# Patient Record
Sex: Male | Born: 1955 | Race: White | Hispanic: No | Smoking: Never smoker
Health system: Southern US, Community
[De-identification: ages and names within clinical notes are randomized; demographics above are authoritative.]

## PROBLEM LIST (undated history)

## (undated) DIAGNOSIS — K4090 Unilateral inguinal hernia, without obstruction or gangrene, not specified as recurrent: Secondary | ICD-10-CM

## (undated) DIAGNOSIS — M1990 Unspecified osteoarthritis, unspecified site: Secondary | ICD-10-CM

## (undated) DIAGNOSIS — T8859XA Other complications of anesthesia, initial encounter: Secondary | ICD-10-CM

## (undated) DIAGNOSIS — K449 Diaphragmatic hernia without obstruction or gangrene: Secondary | ICD-10-CM

## (undated) DIAGNOSIS — E785 Hyperlipidemia, unspecified: Secondary | ICD-10-CM

## (undated) DIAGNOSIS — G4733 Obstructive sleep apnea (adult) (pediatric): Secondary | ICD-10-CM

## (undated) DIAGNOSIS — N400 Enlarged prostate without lower urinary tract symptoms: Secondary | ICD-10-CM

## (undated) HISTORY — PX: SHOULDER SURGERY: SHX246

## (undated) HISTORY — PX: HIP SURGERY: SHX245

## (undated) HISTORY — DX: Unspecified osteoarthritis, unspecified site: M19.90

## (undated) HISTORY — PX: ABDOMINAL HERNIA REPAIR: SHX539

## (undated) HISTORY — DX: Obstructive sleep apnea (adult) (pediatric): G47.33

## (undated) HISTORY — PX: INNER EAR SURGERY: SHX679

## (undated) HISTORY — PX: OTHER SURGICAL HISTORY: SHX169

## (undated) HISTORY — PX: TYMPANOSTOMY: SHX2586

## (undated) HISTORY — DX: Hyperlipidemia, unspecified: E78.5

## (undated) HISTORY — PX: KNEE SURGERY: SHX244

## (undated) HISTORY — PX: TOTAL HIP ARTHROPLASTY: SHX124

## (undated) HISTORY — PX: INGUINAL HERNIA REPAIR: SUR1180

## (undated) HISTORY — DX: Unilateral inguinal hernia, without obstruction or gangrene, not specified as recurrent: K40.90

## (undated) HISTORY — DX: Diaphragmatic hernia without obstruction or gangrene: K44.9

---

## 1995-08-28 HISTORY — PX: KNEE SURGERY: SHX244

## 2012-03-07 ENCOUNTER — Other Ambulatory Visit (HOSPITAL_COMMUNITY): Payer: Self-pay | Admitting: Orthopedic Surgery

## 2012-03-07 DIAGNOSIS — R609 Edema, unspecified: Secondary | ICD-10-CM

## 2012-03-07 DIAGNOSIS — R52 Pain, unspecified: Secondary | ICD-10-CM

## 2012-03-11 ENCOUNTER — Ambulatory Visit (HOSPITAL_COMMUNITY)
Admission: RE | Admit: 2012-03-11 | Discharge: 2012-03-11 | Disposition: A | Discharge status: Home / Self Care | Payer: Managed Care, Other (non HMO) | Source: Ambulatory Visit | Attending: Orthopedic Surgery | Admitting: Orthopedic Surgery

## 2012-03-11 ENCOUNTER — Other Ambulatory Visit (HOSPITAL_COMMUNITY): Payer: Self-pay

## 2012-03-11 DIAGNOSIS — R609 Edema, unspecified: Secondary | ICD-10-CM

## 2012-03-11 DIAGNOSIS — Z96649 Presence of unspecified artificial hip joint: Secondary | ICD-10-CM | POA: Insufficient documentation

## 2012-03-11 DIAGNOSIS — M1610 Unilateral primary osteoarthritis, unspecified hip: Secondary | ICD-10-CM | POA: Insufficient documentation

## 2012-03-11 DIAGNOSIS — M76899 Other specified enthesopathies of unspecified lower limb, excluding foot: Secondary | ICD-10-CM | POA: Insufficient documentation

## 2012-03-11 DIAGNOSIS — R52 Pain, unspecified: Secondary | ICD-10-CM

## 2012-03-11 DIAGNOSIS — M169 Osteoarthritis of hip, unspecified: Secondary | ICD-10-CM | POA: Insufficient documentation

## 2012-03-12 ENCOUNTER — Ambulatory Visit (HOSPITAL_COMMUNITY): Payer: Managed Care, Other (non HMO)

## 2012-04-07 ENCOUNTER — Other Ambulatory Visit: Payer: Self-pay | Admitting: Orthopaedic Surgery

## 2012-04-07 DIAGNOSIS — R52 Pain, unspecified: Secondary | ICD-10-CM

## 2012-04-07 DIAGNOSIS — R200 Anesthesia of skin: Secondary | ICD-10-CM

## 2012-04-08 ENCOUNTER — Ambulatory Visit
Admission: RE | Admit: 2012-04-08 | Discharge: 2012-04-08 | Disposition: A | Discharge status: Home / Self Care | Payer: Managed Care, Other (non HMO) | Source: Ambulatory Visit | Attending: Orthopaedic Surgery | Admitting: Orthopaedic Surgery

## 2012-04-08 DIAGNOSIS — R200 Anesthesia of skin: Secondary | ICD-10-CM

## 2012-04-08 DIAGNOSIS — R52 Pain, unspecified: Secondary | ICD-10-CM

## 2013-06-29 HISTORY — PX: TOTAL HIP ARTHROPLASTY: SHX124

## 2013-08-27 DIAGNOSIS — K449 Diaphragmatic hernia without obstruction or gangrene: Secondary | ICD-10-CM

## 2013-08-27 HISTORY — DX: Diaphragmatic hernia without obstruction or gangrene: K44.9

## 2013-08-27 HISTORY — PX: ABDOMINAL HERNIA REPAIR: SHX539

## 2014-03-02 ENCOUNTER — Encounter (INDEPENDENT_AMBULATORY_CARE_PROVIDER_SITE_OTHER): Payer: Self-pay | Admitting: Surgery

## 2014-03-18 ENCOUNTER — Ambulatory Visit (INDEPENDENT_AMBULATORY_CARE_PROVIDER_SITE_OTHER): Payer: Managed Care, Other (non HMO) | Admitting: Surgery

## 2014-03-18 ENCOUNTER — Encounter (INDEPENDENT_AMBULATORY_CARE_PROVIDER_SITE_OTHER): Payer: Self-pay | Admitting: Surgery

## 2014-03-18 VITALS — BP 102/68 | HR 56 | Temp 97.9°F | Resp 12 | Ht 71.0 in | Wt 197.4 lb

## 2014-03-18 DIAGNOSIS — K439 Ventral hernia without obstruction or gangrene: Secondary | ICD-10-CM | POA: Insufficient documentation

## 2014-03-18 NOTE — Progress Notes (Signed)
Patient ID: Douglas Hendricks, male   DOB: 08-06-1956, 58 y.o.   MRN: [id number]  Chief Complaint  Patient presents with  . Hernia    new pt- eval ventral hernia    HPI Douglas Hendricks is a 58 y.o. male.  Referred by [name] for evaluation of ventral hernia  HPI This is a healthy 6 -year-old male who presents with a couple of years of a visible enlarging mass in his upper abdomen. Occasionally this becomes uncomfortable. The patient is reasonably active. He denies any obstructive symptoms. The patient is scheduled for hip replacement at [facility] in the next couple of weeks. He presents now for evaluation of a possible ventral hernia.  Past Medical History  Diagnosis Date  . Hyperlipidemia   . Arthritis     Past Surgical History  Procedure Laterality Date  . Hip pain Bilateral   . Tympanostomy Right   . Knee surgery Left   . Hip surgery Right   . Total hip arthroplasty      right    Family History  Problem Relation Age of Onset  . Cancer Mother     pancreatic  . COPD Father   . Cancer Father     esophageal    Social History History  Substance Use Topics  . Smoking status: Never Smoker   . Smokeless tobacco: Not on file  . Alcohol Use: Yes     Comment: 1-2    No Known Allergies  Current Outpatient Prescriptions  Medication Sig Dispense Refill  . Fish Oil OIL by Does not apply route.      . Ginkgo Biloba 60 MG CAPS Take by mouth.      Marland Kitchen ibuprofen (ADVIL,MOTRIN) 200 MG tablet Take 200 mg by mouth every 6 (six) hours as needed.      . meloxicam (MOBIC) 15 MG tablet Take 15 mg by mouth daily.      . Misc Natural Products (GLUCOSAMINE CHONDROITIN ADV PO) Take by mouth.      . Red Yeast Rice 600 MG TABS Take by mouth.       No current facility-administered medications for this visit.    Review of Systems Review of Systems  Constitutional: Negative for fever, chills and unexpected weight change.  HENT: Negative for congestion, hearing loss, sore throat, trouble  swallowing and voice change.   Eyes: Negative for visual disturbance.  Respiratory: Negative for cough and wheezing.   Cardiovascular: Negative for chest pain, palpitations and leg swelling.  Gastrointestinal: Positive for abdominal distention. Negative for nausea, vomiting, abdominal pain, diarrhea, constipation, blood in stool, anal bleeding and rectal pain.  Genitourinary: Negative for hematuria and difficulty urinating.  Musculoskeletal: Positive for arthralgias and gait problem.  Skin: Negative for rash and wound.  Neurological: Negative for seizures, syncope, weakness and headaches.  Hematological: Negative for adenopathy. Does not bruise/bleed easily.  Psychiatric/Behavioral: Negative for confusion.    Blood pressure 102/68, pulse 56, temperature 97.9 F (36.6 C), temperature source Oral, resp. rate 12, height 5\' 11"  (1.803 m), weight 197 lb 6.4 oz (89.54 kg).  Physical Exam Physical Exam WDWN in NAD Ambulates with the assistance of a cane HEENT:  EOMI, sclera anicteric Neck:  No masses, no thyromegaly Lungs:  CTA bilaterally; normal respiratory effort CV:  Regular rate and rhythm; no murmurs Abd:  +bowel sounds, soft, non-tender, visible 4 cm mass in epigastrium midline; partially reducible when supine No clear rectus diastasis when supine with muscle contraction Ext:  Well-perfused; no edema Skin:  Warm, dry; no sign of jaundice  Data Reviewed None  Assessment    Small epigastric ventral hernia, likely containing only some preperitoneal fat     Plan    Ventral hernia repair with mesh.  The surgical procedure has been discussed with the patient.  Potential risks, benefits, alternative treatments, and expected outcomes have been explained.  All of the patient's questions at this time have been answered.  The likelihood of reaching the patient's treatment goal is good.  The patient understand the proposed surgical procedure and wishes to proceed. We will schedule this  during his rehab period from his hip surgery.        Kissa Campoy K. 03/18/2014, 12:33 PM

## 2014-04-05 ENCOUNTER — Telehealth (INDEPENDENT_AMBULATORY_CARE_PROVIDER_SITE_OTHER): Payer: Self-pay

## 2014-04-05 NOTE — Telephone Encounter (Signed)
Pt is scheduled for a Vh repair and was wanting to know how long he would be out of work. Informed pt that usually pts will be out of work 2-4 weeks and there will be light duty as well. No lifting, pushing or pulling up to 20lbs. Pt verbalized understanding

## 2014-04-14 ENCOUNTER — Ambulatory Visit: Payer: Managed Care, Other (non HMO)

## 2014-04-15 ENCOUNTER — Ambulatory Visit: Payer: Managed Care, Other (non HMO) | Attending: Physician Assistant

## 2014-04-15 DIAGNOSIS — Z96649 Presence of unspecified artificial hip joint: Secondary | ICD-10-CM | POA: Diagnosis not present

## 2014-04-15 DIAGNOSIS — R269 Unspecified abnormalities of gait and mobility: Secondary | ICD-10-CM | POA: Diagnosis not present

## 2014-04-15 DIAGNOSIS — M6281 Muscle weakness (generalized): Secondary | ICD-10-CM | POA: Diagnosis not present

## 2014-04-15 DIAGNOSIS — IMO0001 Reserved for inherently not codable concepts without codable children: Secondary | ICD-10-CM | POA: Diagnosis not present

## 2014-04-15 DIAGNOSIS — M25559 Pain in unspecified hip: Secondary | ICD-10-CM | POA: Insufficient documentation

## 2014-04-19 ENCOUNTER — Ambulatory Visit: Payer: Managed Care, Other (non HMO) | Admitting: Physical Therapy

## 2014-04-19 DIAGNOSIS — IMO0001 Reserved for inherently not codable concepts without codable children: Secondary | ICD-10-CM | POA: Diagnosis not present

## 2014-04-20 ENCOUNTER — Encounter: Payer: Managed Care, Other (non HMO) | Admitting: Physical Therapy

## 2014-04-20 ENCOUNTER — Other Ambulatory Visit (INDEPENDENT_AMBULATORY_CARE_PROVIDER_SITE_OTHER): Payer: Self-pay

## 2014-04-20 DIAGNOSIS — K439 Ventral hernia without obstruction or gangrene: Secondary | ICD-10-CM

## 2014-04-20 MED ORDER — OXYCODONE-ACETAMINOPHEN 5-325 MG PO TABS: 1.0000 | ORAL_TABLET | ORAL | Status: DC | PRN

## 2014-04-22 ENCOUNTER — Ambulatory Visit: Payer: Managed Care, Other (non HMO)

## 2014-04-22 DIAGNOSIS — IMO0001 Reserved for inherently not codable concepts without codable children: Secondary | ICD-10-CM | POA: Diagnosis not present

## 2014-04-27 ENCOUNTER — Ambulatory Visit: Payer: Managed Care, Other (non HMO) | Attending: Physician Assistant | Admitting: Physical Therapy

## 2014-04-27 DIAGNOSIS — M25559 Pain in unspecified hip: Secondary | ICD-10-CM | POA: Insufficient documentation

## 2014-04-27 DIAGNOSIS — IMO0001 Reserved for inherently not codable concepts without codable children: Secondary | ICD-10-CM | POA: Diagnosis not present

## 2014-04-27 DIAGNOSIS — M6281 Muscle weakness (generalized): Secondary | ICD-10-CM | POA: Diagnosis not present

## 2014-04-27 DIAGNOSIS — R269 Unspecified abnormalities of gait and mobility: Secondary | ICD-10-CM | POA: Diagnosis not present

## 2014-04-27 DIAGNOSIS — Z96649 Presence of unspecified artificial hip joint: Secondary | ICD-10-CM | POA: Diagnosis not present

## 2014-04-30 ENCOUNTER — Encounter: Payer: Managed Care, Other (non HMO) | Admitting: Physical Therapy

## 2014-05-04 ENCOUNTER — Encounter: Payer: Managed Care, Other (non HMO) | Admitting: Physical Therapy

## 2014-05-05 ENCOUNTER — Ambulatory Visit: Payer: Managed Care, Other (non HMO) | Admitting: Physical Therapy

## 2014-05-05 ENCOUNTER — Encounter (INDEPENDENT_AMBULATORY_CARE_PROVIDER_SITE_OTHER): Payer: Managed Care, Other (non HMO) | Admitting: Surgery

## 2014-05-05 DIAGNOSIS — IMO0001 Reserved for inherently not codable concepts without codable children: Secondary | ICD-10-CM | POA: Diagnosis not present

## 2014-05-06 ENCOUNTER — Encounter: Payer: Managed Care, Other (non HMO) | Admitting: Physical Therapy

## 2014-05-11 ENCOUNTER — Ambulatory Visit: Payer: Managed Care, Other (non HMO) | Admitting: Physical Therapy

## 2014-05-11 DIAGNOSIS — IMO0001 Reserved for inherently not codable concepts without codable children: Secondary | ICD-10-CM | POA: Diagnosis not present

## 2014-05-13 ENCOUNTER — Ambulatory Visit: Payer: Managed Care, Other (non HMO) | Admitting: Physical Therapy

## 2014-05-13 DIAGNOSIS — IMO0001 Reserved for inherently not codable concepts without codable children: Secondary | ICD-10-CM | POA: Diagnosis not present

## 2015-08-28 DIAGNOSIS — K4090 Unilateral inguinal hernia, without obstruction or gangrene, not specified as recurrent: Secondary | ICD-10-CM

## 2015-08-28 HISTORY — DX: Unilateral inguinal hernia, without obstruction or gangrene, not specified as recurrent: K40.90

## 2015-10-18 ENCOUNTER — Ambulatory Visit: Payer: Self-pay | Admitting: Surgery

## 2015-10-18 NOTE — H&P (Signed)
  History of Present Illness Douglas Hendricks. Alrick Cubbage MD; 10/18/2015 4:50 PM) Patient words: hernia.  The patient is a 60 year old male who presents with an inguinal hernia. This is a 60 year old male who works at [employer] who is status post open repair of a small supraumbilical ventral hernia in 2015. About 6 weeks ago, the patient began noticing a small bulge in his left groin. This has enlarged slightly. It causes some mild discomfort. He denies any obstructive symptoms. He presents now to discuss surgical repair of a left inguinal hernia.   Allergies (Ammie Eversole, LPN; 1/61/0960 4:54 PM) No Known Drug Allergies 05/05/2014  Medication History (Ammie Eversole, LPN; 0/98/1191 4:78 PM) Sildenafil Citrate (  Tablet, Oral) Active. Medications Reconciled    Vitals (Ammie Eversole LPN; 2/95/6213 0:86 PM) 10/18/2015 2:21 PM Weight: 191.2 lb Height: 71in Body Surface Area: 2.07 m Body Mass Index: 26.67 kg/m  Temp.: 98.74F(Oral)  Pulse: 72 (Regular)  BP: 126/78 (Sitting, Left Arm, Standard)      Physical Exam Molli Hazard K. Trevon Strothers MD; 10/18/2015 4:51 PM)  The physical exam findings are as follows: Note:WDWN in NAD HEENT: EOMI, sclera anicteric Neck: No masses, no thyromegaly Lungs: CTA bilaterally; normal respiratory effort CV: Regular rate and rhythm; no murmurs Abd: +bowel sounds, soft, non-tender, no masses GU; bilateral descended testes; no testicular masses; visible left inguinal hernia - reducible; no right inguinal hernia Ext: Well-perfused; no edema Skin: Warm, dry; no sign of jaundice    Assessment & Plan Molli Hazard K. Chasady Longwell MD; 10/18/2015 2:39 PM)  LEFT INGUINAL HERNIA (K40.90)  Current Plans Pt Education - Pamphlet Given - Hernia Surgery: discussed with patient and provided information. Schedule for Surgery - Left inguinal hernia repair with mesh. The surgical procedure has been discussed with the patient. Potential risks, benefits, alternative  treatments, and expected outcomes have been explained. All of the patient's questions at this time have been answered. The likelihood of reaching the patient's treatment goal is good. The patient understand the proposed surgical procedure and wishes to proceed.  Douglas Hendricks. Corliss Skains, MD, [facility] Surgery  General/ Trauma Surgery  10/18/2015 4:52 PM

## 2015-12-07 ENCOUNTER — Inpatient Hospital Stay (HOSPITAL_COMMUNITY): Admission: RE | Admit: 2015-12-07 | Payer: Managed Care, Other (non HMO) | Source: Ambulatory Visit

## 2015-12-15 ENCOUNTER — Encounter (HOSPITAL_COMMUNITY): Admission: RE | Payer: Self-pay | Source: Ambulatory Visit

## 2015-12-15 ENCOUNTER — Ambulatory Visit (HOSPITAL_COMMUNITY): Admission: RE | Admit: 2015-12-15 | Payer: Managed Care, Other (non HMO) | Source: Ambulatory Visit | Admitting: Surgery

## 2015-12-15 SURGERY — REPAIR, HERNIA, INGUINAL, ADULT
Anesthesia: General | Laterality: Left

## 2016-11-06 ENCOUNTER — Ambulatory Visit (INDEPENDENT_AMBULATORY_CARE_PROVIDER_SITE_OTHER): Payer: Self-pay | Admitting: Physical Medicine and Rehabilitation

## 2017-09-20 ENCOUNTER — Ambulatory Visit
Admission: RE | Admit: 2017-09-20 | Discharge: 2017-09-20 | Disposition: A | Discharge status: Home / Self Care | Payer: Commercial Managed Care - PPO | Source: Ambulatory Visit | Attending: Emergency Medicine | Admitting: Emergency Medicine

## 2017-09-20 ENCOUNTER — Other Ambulatory Visit: Payer: Self-pay | Admitting: Emergency Medicine

## 2017-09-20 DIAGNOSIS — R52 Pain, unspecified: Secondary | ICD-10-CM

## 2017-09-24 ENCOUNTER — Telehealth (INDEPENDENT_AMBULATORY_CARE_PROVIDER_SITE_OTHER): Payer: Self-pay | Admitting: Physical Medicine and Rehabilitation

## 2017-09-24 NOTE — Telephone Encounter (Signed)
If he is having mostly neck pain then would be willing to do facet blocks but would need MRI/CT scan prior to epidural ie does if he have any referred pain down the arm?

## 2017-09-25 NOTE — Telephone Encounter (Signed)
Facets good, likely two level.

## 2017-09-25 NOTE — Telephone Encounter (Signed)
Left message for patient to call back to discuss/ schedule. Per Dr. Dorothyann GibbsLongphre, patient is having neck pain only- no referred pain.

## 2017-09-25 NOTE — Telephone Encounter (Signed)
Patient reports mostly left sided neck pain. He said he sometimes has pain under the left shoulder blade, but no where else. Scheduled for cervical facet injections on 10/07/17 with driver.

## 2017-10-07 ENCOUNTER — Encounter (INDEPENDENT_AMBULATORY_CARE_PROVIDER_SITE_OTHER): Payer: Self-pay | Admitting: Physical Medicine and Rehabilitation

## 2017-10-07 ENCOUNTER — Ambulatory Visit (INDEPENDENT_AMBULATORY_CARE_PROVIDER_SITE_OTHER): Payer: Commercial Managed Care - PPO | Admitting: Physical Medicine and Rehabilitation

## 2017-10-07 ENCOUNTER — Ambulatory Visit (INDEPENDENT_AMBULATORY_CARE_PROVIDER_SITE_OTHER): Payer: Self-pay

## 2017-10-07 VITALS — BP 133/74 | HR 55 | Temp 98.4°F

## 2017-10-07 DIAGNOSIS — M25512 Pain in left shoulder: Secondary | ICD-10-CM

## 2017-10-07 DIAGNOSIS — M542 Cervicalgia: Secondary | ICD-10-CM | POA: Diagnosis not present

## 2017-10-07 DIAGNOSIS — M898X1 Other specified disorders of bone, shoulder: Secondary | ICD-10-CM

## 2017-10-07 DIAGNOSIS — G8929 Other chronic pain: Secondary | ICD-10-CM

## 2017-10-07 DIAGNOSIS — M47812 Spondylosis without myelopathy or radiculopathy, cervical region: Secondary | ICD-10-CM | POA: Diagnosis not present

## 2017-10-07 MED ORDER — METHYLPREDNISOLONE ACETATE 80 MG/ML IJ SUSP
80.0000 mg | Freq: Once | INTRAMUSCULAR | Status: AC
  Administered 2017-10-07: 80 mg

## 2017-10-07 NOTE — Patient Instructions (Signed)

## 2017-10-07 NOTE — Progress Notes (Deleted)
Pt states a constant pain in the neck that radiates into the left shoulder blade. Pt states pain has been going on since October 2018. Pt states driving makes the pain worse, stretches helps with pain. +Driver, -BT, -Dye Allergies.

## 2017-10-22 ENCOUNTER — Encounter (INDEPENDENT_AMBULATORY_CARE_PROVIDER_SITE_OTHER): Payer: Self-pay | Admitting: Physical Medicine and Rehabilitation

## 2017-10-22 NOTE — Procedures (Signed)
Cervical Facet Joint Intra-Articular Injection with Fluoroscopic Guidance  Patient: Douglas Hendricks      Date of Birth: [birth year] MRN: [id number] PCP: Daisy Florooss, Charles Alan, MD      Visit Date: 10/07/2017   Universal Protocol:    Date/Time: 02/26/195:32 AM  Consent Given By: the patient  Position: PRONE  Additional Comments: Vital signs were monitored before and after the procedure. Patient was prepped and draped in the usual sterile fashion. The correct patient, procedure, and site was verified.   Injection Procedure Details:  Procedure Site One Meds Administered:  Meds ordered this encounter  Medications  . methylPREDNISolone acetate (DEPO-MEDROL) injection 80 mg     Laterality: Left  Location/Site:  C5-6 C6-7  Needle size: 25 G  Needle type: Spinal  Needle Placement: Articular  Findings:  -Contrast Used: 0.5 mL iohexol 180 mg iodine/mL   -Comments: Excellent flow of contrast producing a partial arthrogram.  Procedure Details: The region overlying the facet joints mentioned above were localized under fluoroscopic visualization. The needle was inserted down to the level of the lateral mass of the superior articular process of the facet joint to be injected. Then, the needle was "walked off" inferiorly into the lateral aspect of the facet joint. Bi-planar images were used for confirming placement and spot radiographs were documented.  A 0.25 ml volume of Omnipaque-240 was injected into the facet joint and a standard partial arthrogram was obtained. Radiographs were obtained of the arthrogram. A 0.5 ml. volume of the steroid/anesthetic solution was injected into the joint. This procedure was repeated for each facet joint injected.   Additional Comments:  The patient tolerated the procedure well Dressing: Band-Aid    Post-procedure details: Patient was observed during the procedure. Post-procedure instructions were reviewed.  Patient left the clinic in stable  condition.

## 2017-10-22 NOTE — Progress Notes (Signed)
Douglas Hendricks - 62 y.o. male MRN [id number]  Date of birth: 1955/09/04  Office Visit Note: Visit Date: 10/07/2017 PCP: [name], MD Referred by: [name], MD  Subjective: Chief Complaint  Patient presents with  . Neck - Pain  . Left Shoulder - Pain   HPI: Douglas Hendricks is a 62 year old right-hand-dominant gentleman who comes in today at the request of Dr. Charna Archer for evaluation and management of his chronic worsening left neck and shoulder pain.  Douglas Hendricks is someone that I last saw in 62 2016 and we had completed a combination of epidural injection and facet joint block for his lower spine.  We have not seen him for his cervical spine issues other than at one point we did see him for some myofascial type pain.  He comes in today with reports of pain since October 2018 with no specific injury.  He reports pain that radiates from his neck to his left shoulder blade and somewhat of a  C5 distribution versus a myofascial myotome.  He also gets pain in the neck itself which is worse with extension rotation to the left.  He denies any real right-sided complaints.  He denies any focal weakness or radicular pain or paresthesias.  He does report that driving makes his symptoms worse particularly with rotating his neck to look backwards.  He does report that stretching has helped somewhat as well as medication management.  He has had some ongoing physical therapy in the past and present.  Medications have included nonsteroidal anti-inflammatories as well as a small bit of pain medication which she is not taking at this point.  He has had muscle relaxers but is not taking that at this point.  He denies any associated headaches.  He has had no prior cervical surgery.  He has had no advanced cervical imaging but has had a recent x-rays of the cervical spine which were reviewed with the patient today.    Review of Systems  Constitutional: Negative for chills, fever, malaise/fatigue and  weight loss.  HENT: Negative for hearing loss and sinus pain.   Eyes: Negative for blurred vision, double vision and photophobia.  Respiratory: Negative for cough and shortness of breath.   Cardiovascular: Negative for chest pain, palpitations and leg swelling.  Gastrointestinal: Negative for abdominal pain, nausea and vomiting.  Genitourinary: Negative for flank pain.  Musculoskeletal: Positive for joint pain and neck pain. Negative for myalgias.  Skin: Negative for itching and rash.  Neurological: Negative for tremors, focal weakness and weakness.  Endo/Heme/Allergies: Negative.   Psychiatric/Behavioral: Negative for depression.  All other systems reviewed and are negative.  Otherwise per HPI.  Assessment & Plan: Visit Diagnoses:  1. Cervical spondylosis without myelopathy   2. Cervicalgia   3. Pain of left scapula   4. Chronic left shoulder pain     Plan: Findings:  Chronic worsening neck pain with referral pattern to the left shoulder blade and somewhat of the C5 or C6 distribution around the deltoid and shoulder blade.  X-ray imaging does show significant degenerative changes at C6-7 and less so at C5-6.  He does have pain with rotation and extension consistent with facet arthropathy.  He also has myofascial pain type symptoms as well with focal trigger points that reproduce some of the pain.  He does not really have much in the way of shoulder impingement.  I do think given the chronicity and severity of his symptoms we can complete a diagnostic facet joint  block today.  I consider that for him today since he has been here in the past and does know about the injections and we discussed the safety of doing facet joint blocks.  We then completed side-lying C6-7 and C5-6 facet block.  Depending on relief would look at trigger point injection versus regrouping with a physical therapist for dry needling.  We discussed exercises and strengthening with him today.  Injection was performed and we  will see him back in a few weeks if needed.    Meds & Orders:  Meds ordered this encounter  Medications  . methylPREDNISolone acetate (DEPO-MEDROL) injection 80 mg    Orders Placed This Encounter  Procedures  . Facet Injection  . XR C-ARM NO REPORT    Follow-up: Return if symptoms worsen or fail to improve, for Consider MRI.   Procedures: No procedures performed  Cervical Facet Joint Intra-Articular Injection with Fluoroscopic Guidance  Patient: Douglas Hendricks      Date of Birth: 61-Apr-1957 MRN: [id number] PCP: [name], MD      Visit Date: 10/07/2017   Universal Protocol:    Date/Time: 02/26/195:32 AM  Consent Given By: the patient  Position: PRONE  Additional Comments: Vital signs were monitored before and after the procedure. Patient was prepped and draped in the usual sterile fashion. The correct patient, procedure, and site was verified.   Injection Procedure Details:  Procedure Site One Meds Administered:  Meds ordered this encounter  Medications  . methylPREDNISolone acetate (DEPO-MEDROL) injection 80 mg     Laterality: Left  Location/Site:  C5-6 C6-7  Needle size: 25 G  Needle type: Spinal  Needle Placement: Articular  Findings:  -Contrast Used: 0.5 mL iohexol 180 mg iodine/mL   -Comments: Excellent flow of contrast producing a partial arthrogram.  Procedure Details: The region overlying the facet joints mentioned above were localized under fluoroscopic visualization. The needle was inserted down to the level of the lateral mass of the superior articular process of the facet joint to be injected. Then, the needle was "walked off" inferiorly into the lateral aspect of the facet joint. Bi-planar images were used for confirming placement and spot radiographs were documented.  A 0.25 ml volume of Omnipaque-240 was injected into the facet joint and a standard partial arthrogram was obtained. Radiographs were obtained of the arthrogram. A 0.5 ml.  volume of the steroid/anesthetic solution was injected into the joint. This procedure was repeated for each facet joint injected.   Additional Comments:  The patient tolerated the procedure well Dressing: Band-Aid    Post-procedure details: Patient was observed during the procedure. Post-procedure instructions were reviewed.  Patient left the clinic in stable condition.   Clinical History: CLINICAL DATA:  Neck pain for 3 months which is worsening and radiates into the left scapula.  EXAM: CERVICAL SPINE - COMPLETE 4+ VIEW  COMPARISON:  None.  FINDINGS: No fracture or malalignment. Marked loss of disc space height is seen at C6-7 with endplate spurring. Prevertebral soft tissues appear normal. Lung apices are clear.  IMPRESSION: No acute finding.  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   Electronically Signed   By: Drusilla Kanner M.D.   On: 09/20/2017 20:48  He reports that  has never smoked. He does not have any smokeless tobacco history on file. No results for input(s): HGBA1C, LABURIC in the last 8760 hours.  Objective:  VS:  HT:    WT:   BMI:     BP:133/74  HR:(!) 55bpm  TEMP:98.4  F (36.9 C)(Oral)  RESP:96 % Physical Exam  Constitutional: He is oriented to person, place, and time. He appears well-developed and well-nourished. No distress.  HENT:  Head: Normocephalic and atraumatic.  Nose: Nose normal.  Mouth/Throat: Oropharynx is clear and moist.  Eyes: Conjunctivae are normal. Pupils are equal, round, and reactive to light.  Neck: Neck supple. No tracheal deviation present.  Cardiovascular: Regular rhythm and intact distal pulses.  Pulmonary/Chest: Effort normal and breath sounds normal.  Abdominal: Soft. He exhibits no distension. There is no rebound and no guarding.  Musculoskeletal: He exhibits no deformity.  Patient sits with forward flexed cervical spine.  He does have concordant pain with left-sided rotation and extension.  He  has a negative Spurling's test bilaterally.  Is a negative Hoffman's test bilaterally.  He has very mild shoulder impingement bilaterally with external rotation.  He does have focal trigger points in the levator scapula and rhomboid region particularly left more than right.  He has good strength in the upper extremities bilaterally without any deficits.  Lymphadenopathy:    He has no cervical adenopathy.  Neurological: He is alert and oriented to person, place, and time. He exhibits normal muscle tone. Coordination normal.  Skin: Skin is warm. No rash noted.  Psychiatric: He has a normal mood and affect. His behavior is normal.  Nursing note and vitals reviewed.   Ortho Exam Imaging: No results found.  Past Medical/Family/Surgical/Social History: Medications & Allergies reviewed per EMR Patient Active Problem List   Diagnosis Date Noted  . Ventral hernia without obstruction or gangrene 03/18/2014   Past Medical History:  Diagnosis Date  . Arthritis   . Hyperlipidemia    Family History  Problem Relation Age of Onset  . Cancer Mother        pancreatic  . COPD Father   . Cancer Father        esophageal   Past Surgical History:  Procedure Laterality Date  . ABDOMINAL HERNIA REPAIR    . hip pain Bilateral   . HIP SURGERY Right   . INGUINAL HERNIA REPAIR Left   . KNEE SURGERY Left   . TOTAL HIP ARTHROPLASTY     right  . TYMPANOSTOMY Right    Social History   Occupational History  . Not on file  Tobacco Use  . Smoking status: Never Smoker  Substance and Sexual Activity  . Alcohol use: Yes    Comment: 1-2  . Drug use: No  . Sexual activity: Not on file

## 2017-10-28 ENCOUNTER — Telehealth (INDEPENDENT_AMBULATORY_CARE_PROVIDER_SITE_OTHER): Payer: Self-pay | Admitting: Physical Medicine and Rehabilitation

## 2017-10-28 NOTE — Telephone Encounter (Signed)
Left message for patient to call back to schedule.  °

## 2017-10-28 NOTE — Telephone Encounter (Signed)
Ok to do then

## 2017-10-30 NOTE — Telephone Encounter (Signed)
I think we need to get notes from Dr. Dorothyann GibbsLongphre concerning the patient's shoulder versus his cervical spine.  I think if the cervical spine injection did not seem to help the next step would either be regrouping with a good physical therapist versus MRI imaging of the cervical spine.

## 2017-10-30 NOTE — Telephone Encounter (Signed)
I called the patient to schedule a shoulder injection. He wanted to make you aware that his neck pain is not much better following his cervical injection a few weeks ago. He is also still having pain under his shoulder blade. I have not scheduled him for anything yet. Please advise.

## 2017-10-31 NOTE — Telephone Encounter (Signed)
Patient will get notes from last visit with Dr. Dorothyann GibbsLongphre sent to us.

## 2017-12-03 NOTE — Telephone Encounter (Signed)
Will await callback from patient.

## 2018-05-06 ENCOUNTER — Encounter (INDEPENDENT_AMBULATORY_CARE_PROVIDER_SITE_OTHER): Payer: Self-pay | Admitting: Orthopaedic Surgery

## 2018-05-06 ENCOUNTER — Ambulatory Visit (INDEPENDENT_AMBULATORY_CARE_PROVIDER_SITE_OTHER): Payer: Commercial Managed Care - PPO | Admitting: Orthopaedic Surgery

## 2018-05-06 VITALS — BP 138/76 | HR 57 | Ht 71.0 in | Wt 192.0 lb

## 2018-05-06 DIAGNOSIS — G8929 Other chronic pain: Secondary | ICD-10-CM | POA: Diagnosis not present

## 2018-05-06 DIAGNOSIS — M25511 Pain in right shoulder: Secondary | ICD-10-CM
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
## 2018-05-15 ENCOUNTER — Telehealth (INDEPENDENT_AMBULATORY_CARE_PROVIDER_SITE_OTHER): Payer: Self-pay | Admitting: Orthopaedic Surgery

## 2018-05-15 NOTE — Telephone Encounter (Signed)
Patient called stating he saw Dr. Cleophas DunkerWhitfield on 05/06/18 and discussed right shoulder surgery.  Patient states he has not received a phone call to schedule the surgery.  Patient is requesting a return call.

## 2018-05-15 NOTE — Telephone Encounter (Signed)
Do we know the status of his surgery? I refaxed surgery sheet to Chryst[facility]iler[facility] just in case it was not received. Please advise pt. Thank you for all that you do!!

## 2018-06-19 ENCOUNTER — Encounter: Payer: Self-pay | Admitting: Orthopaedic Surgery

## 2018-06-19 ENCOUNTER — Telehealth (INDEPENDENT_AMBULATORY_CARE_PROVIDER_SITE_OTHER): Payer: Self-pay | Admitting: Orthopaedic Surgery

## 2018-06-19 DIAGNOSIS — S46011S Strain of muscle(s) and tendon(s) of the rotator cuff of right shoulder, sequela: Secondary | ICD-10-CM

## 2018-06-19 DIAGNOSIS — M7541 Impingement syndrome of right shoulder: Secondary | ICD-10-CM

## 2018-06-19 DIAGNOSIS — M19011 Primary osteoarthritis, right shoulder: Secondary | ICD-10-CM

## 2018-06-19 DIAGNOSIS — M75111 Incomplete rotator cuff tear or rupture of right shoulder, not specified as traumatic: Secondary | ICD-10-CM

## 2018-06-19 NOTE — Telephone Encounter (Signed)
Please advise 

## 2018-06-19 NOTE — Telephone Encounter (Signed)
NOTIFIED PT SYMPTOMS WERE NORMAL AND WOULD LAST ABOUT 24 HRS TO CALL OUR OFFICE IF SYMPTOMS GET WORSE OR DO NOT GO AWAY

## 2018-06-19 NOTE — Telephone Encounter (Signed)
Per our discussion.

## 2018-06-19 NOTE — Telephone Encounter (Signed)
Patient called stating he had surgery this morning and his fingers on right hand are swollen and beginning to tingle.  Patient requested a return call.

## 2018-06-23 ENCOUNTER — Ambulatory Visit (INDEPENDENT_AMBULATORY_CARE_PROVIDER_SITE_OTHER): Payer: Commercial Managed Care - PPO | Admitting: Orthopaedic Surgery

## 2018-06-23 ENCOUNTER — Telehealth (INDEPENDENT_AMBULATORY_CARE_PROVIDER_SITE_OTHER): Payer: Self-pay | Admitting: Radiology

## 2018-06-23 ENCOUNTER — Encounter (INDEPENDENT_AMBULATORY_CARE_PROVIDER_SITE_OTHER): Payer: Self-pay | Admitting: Orthopaedic Surgery

## 2018-06-23 VITALS — BP 140/83 | HR 57 | Ht 71.0 in | Wt 192.0 lb

## 2018-06-23 DIAGNOSIS — M25511 Pain in right shoulder: Secondary | ICD-10-CM

## 2018-06-23 DIAGNOSIS — G8929 Other chronic pain: Secondary | ICD-10-CM

## 2018-06-23 NOTE — Progress Notes (Signed)
Office Visit Note   Patient: Douglas Hendricks           Date of Birth: [birth year]           MRN: [id number] Visit Date: 06/23/2018              Requested by: [name], MD [address] [place], [place] [address] PCP: [name], MD   Assessment & Plan: Visit Diagnoses:  1. Chronic right shoulder pain     Plan: 4 days status post arthroscopic SCD DCR and mini open rotator cuff tear repair right shoulder.  Doing well.  Not taking any more pain medicines.  Old Steri-Strips removed and new strips applied.  Start circumduction exercises.  Continue with sling.  Office 1 week  Follow-Up Instructions: Return in about 1 week (around 06/30/2018).   Orders:  No orders of the defined types were placed in this encounter.  No orders of the defined types were placed in this encounter.     Procedures: No procedures performed   Clinical Data: No additional findings.   Subjective: Chief Complaint  Patient presents with  . Follow-up    06/20/18 R SHOULDER F/U HAD SEVERE PAIN UN UPPER ARM FOR 12 HRS AFTER SURGERY PT THINKS IT WAS FROM HIS BURSITIS IN ARM HEART PAIN BUT THINKS ITS FROM THE MEDICINE THEY USED TO PUT HIM TO SLEEP HAD PRIOR EPSIDOE WITH L HIP SURGERY, HAD SOME CONSTIPATION WITH BACK PAIN   No related fever or chills.  Pain under control with Tylenol  HPI  Review of Systems  Constitutional: Positive for fatigue and fever.  HENT: Negative for ear pain.   Eyes: Negative for pain.  Respiratory: Positive for shortness of breath. Negative for cough.   Cardiovascular: Negative for leg swelling.  Gastrointestinal: Positive for constipation. Negative for diarrhea.  Genitourinary: Negative for difficulty urinating.  Musculoskeletal: Negative for back pain and neck pain.  Skin: Negative for rash.  Allergic/Immunologic: Negative for food allergies.  Neurological: Positive for weakness. Negative for numbness.  Hematological: Does not bruise/bleed easily.    Psychiatric/Behavioral: Positive for sleep disturbance.     Objective: Vital Signs: BP 140/83 (BP Location: Left Arm, Patient Position: Sitting, Cuff Size: Normal)   Pulse (!) 57   Ht 5\' 11"  (1.803 m)   Wt 192 lb (87.1 kg)   BMI 26.78 kg/m   Physical Exam  Ortho Exam awake alert and oriented x3.  Comfortable sitting.  Steri-Strips and dressing removed from right shoulder.  Wounds healing without problem.  New strips applied with benzoin.  Neurovascular exam intact Specialty Comments:  No specialty comments available.  Imaging: No results found.   PMFS History: Patient Active Problem List   Diagnosis Date Noted  . Ventral hernia without obstruction or gangrene 03/18/2014   Past Medical History:  Diagnosis Date  . Arthritis   . Hernia, inguinal, left 2017  . Hyperlipidemia   . Thoracic stomach hernia 2015    Family History  Problem Relation Age of Onset  . Cancer Mother        pancreatic  . COPD Father   . Cancer Father        esophageal    Past Surgical History:  Procedure Laterality Date  . ABDOMINAL HERNIA REPAIR    . hip pain Bilateral   . HIP SURGERY Right   . INGUINAL HERNIA REPAIR Left   . KNEE SURGERY Left   . TOTAL HIP ARTHROPLASTY     right  .  TYMPANOSTOMY Right    Social History   Occupational History  . Not on file  Tobacco Use  . Smoking status: Never Smoker  . Smokeless tobacco: Never Used  Substance and Sexual Activity  . Alcohol use: Yes    Comment: 1-2  . Drug use: No  . Sexual activity: Not on file

## 2018-06-23 NOTE — Telephone Encounter (Signed)
Representative from [facility] called and LM to speak with someone to verify that patient did have surgery, and date of surgery.  Also wants to confirm ICD 10 codes, next appointment and estimated RTW date.   ICD 10 codes were M75.41, M19.011 and S46.011S. You can fax info to them as well, [phone].

## 2018-06-23 NOTE — Telephone Encounter (Signed)
Per dr whitfield pt. is expected to be out of work for  6-8 wks, dr whitfield will be able to better address at pt next appt. Faxed letter with icd 10 codes, date of surgery, and expected return to work time

## 2018-06-30 ENCOUNTER — Ambulatory Visit (INDEPENDENT_AMBULATORY_CARE_PROVIDER_SITE_OTHER): Payer: Commercial Managed Care - PPO | Admitting: Orthopaedic Surgery

## 2018-06-30 ENCOUNTER — Other Ambulatory Visit (INDEPENDENT_AMBULATORY_CARE_PROVIDER_SITE_OTHER): Payer: Self-pay | Admitting: Radiology

## 2018-06-30 ENCOUNTER — Encounter (INDEPENDENT_AMBULATORY_CARE_PROVIDER_SITE_OTHER): Payer: Self-pay | Admitting: Orthopaedic Surgery

## 2018-06-30 VITALS — BP 123/67 | HR 57 | Ht 71.5 in | Wt 195.0 lb

## 2018-06-30 DIAGNOSIS — G8929 Other chronic pain: Secondary | ICD-10-CM

## 2018-06-30 DIAGNOSIS — M25511 Pain in right shoulder: Secondary | ICD-10-CM

## 2018-06-30 NOTE — Progress Notes (Signed)
Office Visit Note   Patient: Douglas Hendricks           Date of Birth: 1956-07-30           MRN: [id number] Visit Date: 06/30/2018              Requested by: [name], MD [address] [place], [place] [address] PCP: [name], MD   Assessment & Plan: Visit Diagnoses:  1. Chronic right shoulder pain     Plan: 11 days status post arthroscopic SCD DCR and mini open rotator cuff tear repair.  Doing well.  Wearing sling.  Will start therapy and return to office in 2 weeks.  Decide on work status when he returns  Follow-Up Instructions: Return in about 2 weeks (around 07/14/2018).   Orders:  No orders of the defined types were placed in this encounter.  No orders of the defined types were placed in this encounter.     Procedures: No procedures performed   Clinical Data: No additional findings.   Subjective: Chief Complaint  Patient presents with  . Follow-up    06/19/18 R SHOULDER ARTHRO GOING GOOD  Doing well postop.  No fever or chills.  Comfortable in sling.  Has been performing circumduction exercises  HPI  Review of Systems  Constitutional: Negative for fatigue and fever.  HENT: Negative for ear pain.   Eyes: Negative for pain.  Respiratory: Negative for cough and shortness of breath.   Cardiovascular: Negative for leg swelling.  Gastrointestinal: Negative for constipation and diarrhea.  Genitourinary: Negative for difficulty urinating.  Musculoskeletal: Negative for back pain and neck pain.  Skin: Negative for rash.  Allergic/Immunologic: Negative for food allergies.  Neurological: Positive for weakness. Negative for numbness.  Hematological: Does not bruise/bleed easily.  Psychiatric/Behavioral: Positive for sleep disturbance.     Objective: Vital Signs: BP 123/67 (BP Location: Left Arm, Patient Position: Sitting, Cuff Size: Normal)   Pulse (!) 57   Ht 5' 11.5" (1.816 m)   Wt 195 lb (88.5 kg)   BMI 26.82 kg/m   Physical  Exam  Ortho Exam awake alert and oriented x3.  Comfortable sitting.  Incisions healing nicely right shoulder.  No evidence of infection.  Good grip and good release.  Biceps intact.  Specialty Comments:  No specialty comments available.  Imaging: No results found.   PMFS History: Patient Active Problem List   Diagnosis Date Noted  . Ventral hernia without obstruction or gangrene 03/18/2014   Past Medical History:  Diagnosis Date  . Arthritis   . Hernia, inguinal, left 2017  . Hyperlipidemia   . Thoracic stomach hernia 2015    Family History  Problem Relation Age of Onset  . Cancer Mother        pancreatic  . COPD Father   . Cancer Father        esophageal    Past Surgical History:  Procedure Laterality Date  . ABDOMINAL HERNIA REPAIR    . hip pain Bilateral   . HIP SURGERY Right   . INGUINAL HERNIA REPAIR Left   . INNER EAR SURGERY Right   . KNEE SURGERY Left   . TOTAL HIP ARTHROPLASTY     right  . TYMPANOSTOMY Right    Social History   Occupational History  . Not on file  Tobacco Use  . Smoking status: Never Smoker  . Smokeless tobacco: Never Used  Substance and Sexual Activity  . Alcohol use: Yes    Comment:  1-2  . Drug use: No  . Sexual activity: Not on file

## 2018-07-02 ENCOUNTER — Ambulatory Visit: Payer: Commercial Managed Care - PPO | Attending: Orthopaedic Surgery

## 2018-07-02 ENCOUNTER — Other Ambulatory Visit: Payer: Self-pay

## 2018-07-02 DIAGNOSIS — M25511 Pain in right shoulder: Secondary | ICD-10-CM | POA: Diagnosis not present

## 2018-07-02 DIAGNOSIS — M25611 Stiffness of right shoulder, not elsewhere classified: Secondary | ICD-10-CM | POA: Insufficient documentation

## 2018-07-02 DIAGNOSIS — M6281 Muscle weakness (generalized): Secondary | ICD-10-CM | POA: Insufficient documentation

## 2018-07-02 NOTE — Patient Instructions (Signed)
Access Code: [email or web address]  URL: [email or web address]  Date: 07/02/2018  Prepared by: Lorrene Reid   Exercises  Seated Scapular Retraction - 10 Reps - 2 Sets - 5x daily - 7x weekly  Circular Shoulder Pendulum with Table Support - 10 Reps - 3 Sets - 1x daily - 7x weekly  Seated Elbow Flexion and Extension AROM - 10 Reps - 2 Sets - 5x daily - 7x weekly  Wrist Flexion AROM - 10 Reps - 1 Sets - 5x daily - 7x weekly  Seated Forearm Supination Pronation - 10 reps - 1 sets - 2x daily - 7x weekly  Putty Squeezes - 10 reps - 2 sets - 5 hold - 5x daily - 7x weekly
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
## 2018-07-04 ENCOUNTER — Ambulatory Visit: Payer: Commercial Managed Care - PPO | Admitting: Physical Therapy

## 2018-07-04 ENCOUNTER — Encounter: Payer: Self-pay | Admitting: Physical Therapy

## 2018-07-04 DIAGNOSIS — M25511 Pain in right shoulder: Secondary | ICD-10-CM | POA: Diagnosis not present

## 2018-07-04 DIAGNOSIS — M6281 Muscle weakness (generalized): Secondary | ICD-10-CM

## 2018-07-04 DIAGNOSIS — M25611 Stiffness of right shoulder, not elsewhere classified: Secondary | ICD-10-CM

## 2018-07-04 NOTE — Therapy (Signed)
[facility] [address], [address] [place], [place], [address] Phone: [phone]   Fax:  [phone]  Physical Therapy Treatment  Patient Details  Name: Douglas Hendricks MRN: [id number] Date of Birth: [birth year] Referring Provider (PT): Norlene Campbell, MD   Encounter Date: 07/04/2018  PT End of Session - 07/04/18 0917    Visit Number  2    Date for PT Re-Evaluation  08/27/18    Authorization Type  UHC/UMR    Authorization - Visit Number  2    Authorization - Number of Visits  60    PT Start Time  0849    PT Stop Time  0947    PT Time Calculation (min)  58 min    Activity Tolerance  Patient tolerated treatment well;No increased pain    Behavior During Therapy  WFL for tasks assessed/performed       Past Medical History:  Diagnosis Date  . Arthritis   . Hernia, inguinal, left 2017  . Hyperlipidemia   . Thoracic stomach hernia 2015    Past Surgical History:  Procedure Laterality Date  . ABDOMINAL HERNIA REPAIR    . hip pain Bilateral   . HIP SURGERY Right   . INGUINAL HERNIA REPAIR Left   . INNER EAR SURGERY Right   . KNEE SURGERY Left   . TOTAL HIP ARTHROPLASTY     right  . TYMPANOSTOMY Right     There were no vitals filed for this visit.  Subjective Assessment - 07/04/18 0851    Subjective  Pt states that things are going well. He is completing his HEP regularly.     Pertinent History  Rt RTC repair 06/19/18    Diagnostic tests  none    Patient Stated Goals  improve use of Rt UE, improve strength, reduce pain    Currently in Pain?  Yes    Pain Score  2     Pain Location  Shoulder    Pain Orientation  Right    Pain Descriptors / Indicators  Sore    Pain Type  Surgical pain    Pain Radiating Towards  none     Pain Onset  1 to 4 weeks ago    Pain Frequency  Intermittent    Aggravating Factors   moving the shoulder, sleep    Pain Relieving Factors  pain medication, sometimes ice                        [facility] Adult PT Treatment/Exercise - 07/04/18 0001      Exercises   Exercises  Shoulder;Neck      Neck Exercises: Seated   Cervical Isometrics  Flexion;Extension;Left lateral flexion;Right lateral flexion;10 secs;5 reps      Shoulder Exercises: Seated   Retraction  Both;15 reps    Other Seated Exercises  B shoulder depression x15 reps; shoulder clocks x15 reps    Other Seated Exercises  Rt forearm supination/pronation x10 reps with red TB (arm supported to decreased need for shoulder ER)      Modalities   Modalities  --      Cryotherapy   Number Minutes Cryotherapy  --    Cryotherapy Location  --    Type of Cryotherapy  --      Vasopneumatic   Number Minutes Vasopneumatic   15 minutes    Vasopnuematic Location   Shoulder   Rt   Vasopneumatic Pressure  Medium    Vasopneumatic Temperature  35      Manual Therapy   Manual therapy comments  Rt shoulder PROM flexion to 100 deg, scaption to 100 deg, ER 20 deg             PT Education - 07/04/18 0917    Education Details  technique with therex    Person(s) Educated  Patient    Methods  Explanation;Verbal cues    Comprehension  Returned demonstration;Verbalized understanding       PT Short Term Goals - 07/04/18 0927      PT SHORT TERM GOAL #1   Title  be independent in initial HEP    Time  4    Period  Weeks    Status  Achieved      PT SHORT TERM GOAL #2   Title  demonstrate Rt shoulder P/ROM flexion to > or = to 130 degrees to improve mobility    Time  4    Period  Weeks    Status  New      PT SHORT TERM GOAL #3   Title  demonstrate Rt shoulder P/ROM ER to > or = to 45 degrees to improve mobility    Time  4    Period  Weeks    Status  New      PT SHORT TERM GOAL #4   Title  demonstrate Rt shoulder A/AROM flexion to > or = to 100 degrees without scapular elevation to restore normal mechanics    Time  4    Period  Weeks    Status  New        PT Long Term Goals -  07/02/18 1117      PT LONG TERM GOAL #1   Title  be independent in advanced HEP    Time  8    Period  Weeks    Status  New    Target Date  08/27/18      PT LONG TERM GOAL #2   Title  reduce FOTO to < or = to 35% limitation    Time  8    Period  Weeks    Status  New    Target Date  08/27/18      PT LONG TERM GOAL #3   Title  demonstrate Rt shoulder A/ROM flexion to > or = to 140 degrees to allow for overhead reaching     Time  8    Period  Weeks    Status  New    Target Date  08/27/18      PT LONG TERM GOAL #4   Title  demonstrate Rt IR A/ROM to > or = to L1 to improve self-care    Time  8    Period  Weeks    Status  New    Target Date  08/27/18      PT LONG TERM GOAL #5   Title  demonstrate > or = to 4/5 Rt shoulder strength to improve endurance for use with functional tasks    Time  8    Period  Weeks    Target Date  08/27/18      Additional Long Term Goals   Additional Long Term Goals  Yes      PT LONG TERM GOAL #6   Title  report > or = to 60% use of Rt UE without limitation due to strength, ROM or pain    Time  8    Period  Weeks  Status  New    Target Date  08/27/18            Plan - 07/04/18 1610    Clinical Impression Statement  Pt arrived with minimal increase in pain and reported HEP adherence following his evaluation last session. His passive shoulder ROM is progressing well up to 100 deg without discomfort into flexion and scaption. Completed therex to improve scapula control and strength. Pt had no increase in pain during or following session. Will continue with current POC and progression of shoulder ROM, strength and per post-op protocol.     Rehab Potential  Excellent    PT Frequency  2x / week    PT Duration  8 weeks    PT Treatment/Interventions  ADLs/Self Care Home Management;Cryotherapy;Electrical Stimulation;Moist Heat;Therapeutic activities;Therapeutic exercise;Patient/family education;Neuromuscular re-education;Manual  techniques;Passive range of motion;Vasopneumatic Device;Taping    PT Next Visit Plan  PROM progression per protocol, scap control/strength progression; manual and modalities as needed    PT Home Exercise Plan  Access Code: [email or web address]     Consulted and Agree with Plan of Care  Patient       Patient will benefit from skilled therapeutic intervention in order to improve the following deficits and impairments:  Pain, Improper body mechanics, Impaired flexibility, Decreased activity tolerance, Decreased range of motion, Decreased strength, Impaired UE functional use, Postural dysfunction, Decreased scar mobility  Visit Diagnosis: Acute pain of right shoulder  Stiffness of right shoulder, not elsewhere classified  Muscle weakness (generalized)     Problem List Patient Active Problem List   Diagnosis Date Noted  . Ventral hernia without obstruction or gangrene 03/18/2014    9:59 AM,07/04/18 Donita Brooks PT, DPT [facility]  [phone]  [facility] [address], [address] [place], [place], [address] Phone: [phone]   Fax:  [phone]  Name: Douglas Hendricks MRN: [id number] Date of Birth: August 07, 1956

## 2018-07-08 ENCOUNTER — Ambulatory Visit: Payer: Commercial Managed Care - PPO | Admitting: Physical Therapy

## 2018-07-08 DIAGNOSIS — M25611 Stiffness of right shoulder, not elsewhere classified: Secondary | ICD-10-CM

## 2018-07-08 DIAGNOSIS — M25511 Pain in right shoulder: Secondary | ICD-10-CM | POA: Diagnosis not present

## 2018-07-08 DIAGNOSIS — M6281 Muscle weakness (generalized): Secondary | ICD-10-CM

## 2018-07-08 NOTE — Patient Instructions (Signed)
Access Code: [email or web address]  URL: [email or web address]  Date: 07/08/2018  Prepared by: Ivery QualeBrian Nelson   Exercises  Supine Shoulder Flexion Extension AAROM with Dowel - 10 reps - 1-2 sets - 2x daily - 6x weekly  Supine Shoulder Abduction AAROM with Dowel - 10 reps - 1-2 sets - 2x daily - 6x weekly  Supine Shoulder External Rotation in 45 Degrees Abduction AAROM with Dowel - 10 reps - 1-2 sets - 2x daily - 6x weekly

## 2018-07-08 NOTE — Therapy (Signed)
[facility] [address], [address] [place], [place], [address] Phone: [phone]   Fax:  [phone]  Physical Therapy Treatment  Patient Details  Name: Douglas Hendricks MRN: [id number] Date of Birth: 03-13-1956 Referring Provider (PT): Norlene Campbell, MD   Encounter Date: 07/08/2018  PT End of Session - 07/08/18 1110    Visit Number  3    Date for PT Re-Evaluation  08/27/18    Authorization Type  UHC/UMR    Authorization - Visit Number  3    Authorization - Number of Visits  60    PT Start Time  1015    PT Stop Time  1108    PT Time Calculation (min)  53 min    Activity Tolerance  Patient tolerated treatment well       Past Medical History:  Diagnosis Date  . Arthritis   . Hernia, inguinal, left 2017  . Hyperlipidemia   . Thoracic stomach hernia 2015    Past Surgical History:  Procedure Laterality Date  . ABDOMINAL HERNIA REPAIR    . hip pain Bilateral   . HIP SURGERY Right   . INGUINAL HERNIA REPAIR Left   . INNER EAR SURGERY Right   . KNEE SURGERY Left   . TOTAL HIP ARTHROPLASTY     right  . TYMPANOSTOMY Right     There were no vitals filed for this visit.  Subjective Assessment - 07/08/18 1101    Subjective  Pt has no complaints with his shoulder    Pertinent History  Rt RTC repair 06/19/18    Currently in Pain?  No/denies                       [facility] Adult PT Treatment/Exercise - 07/08/18 0001      Exercises   Exercises  Shoulder      Shoulder Exercises: Supine   External Rotation  AAROM;10 reps    Flexion  AAROM;10 reps    ABduction  AAROM;10 reps    Other Supine Exercises  supine chest press AAROM hands clasped together X 10      Shoulder Exercises: Seated   Retraction  20 reps    Other Seated Exercises  bicep curls 20 reps red band      Shoulder Exercises: Pulleys   Flexion  2 minutes    Scaption  2 minutes      Modalities   Modalities  Vasopneumatic      Vasopneumatic   Number Minutes Vasopneumatic   15 minutes    Vasopnuematic Location   Shoulder    Vasopneumatic Pressure  Medium    Vasopneumatic Temperature   35      Manual Therapy   Manual therapy comments  Rt shoulder PROM all planes               PT Short Term Goals - 07/04/18 6962      PT SHORT TERM GOAL #1   Title  be independent in initial HEP    Time  4    Period  Weeks    Status  Achieved      PT SHORT TERM GOAL #2   Title  demonstrate Rt shoulder P/ROM flexion to > or = to 130 degrees to improve mobility    Time  4    Period  Weeks    Status  New      PT SHORT TERM GOAL #3   Title  demonstrate Rt  shoulder P/ROM ER to > or = to 45 degrees to improve mobility    Time  4    Period  Weeks    Status  New      PT SHORT TERM GOAL #4   Title  demonstrate Rt shoulder A/AROM flexion to > or = to 100 degrees without scapular elevation to restore normal mechanics    Time  4    Period  Weeks    Status  New        PT Long Term Goals - 07/02/18 1117      PT LONG TERM GOAL #1   Title  be independent in advanced HEP    Time  8    Period  Weeks    Status  New    Target Date  08/27/18      PT LONG TERM GOAL #2   Title  reduce FOTO to < or = to 35% limitation    Time  8    Period  Weeks    Status  New    Target Date  08/27/18      PT LONG TERM GOAL #3   Title  demonstrate Rt shoulder A/ROM flexion to > or = to 140 degrees to allow for overhead reaching     Time  8    Period  Weeks    Status  New    Target Date  08/27/18      PT LONG TERM GOAL #4   Title  demonstrate Rt IR A/ROM to > or = to L1 to improve self-care    Time  8    Period  Weeks    Status  New    Target Date  08/27/18      PT LONG TERM GOAL #5   Title  demonstrate > or = to 4/5 Rt shoulder strength to improve endurance for use with functional tasks    Time  8    Period  Weeks    Target Date  08/27/18      Additional Long Term Goals   Additional Long Term Goals  Yes      PT  LONG TERM GOAL #6   Title  report > or = to 60% use of Rt UE without limitation due to strength, ROM or pain    Time  8    Period  Weeks    Status  New    Target Date  08/27/18            Plan - 07/08/18 1111    Clinical Impression Statement  Pt is doing quite well after his RTC repair. Session focused on ROM today with PROM and AAROM, His HEP was updated for him to now add in supine wand execises which he showed good return demonstration in gentle ROM. He will be able to also add in isometrics for IR and ext next session per protocol.     Rehab Potential  Excellent    PT Frequency  2x / week    PT Duration  8 weeks    PT Treatment/Interventions  ADLs/Self Care Home Management;Cryotherapy;Electrical Stimulation;Moist Heat;Therapeutic activities;Therapeutic exercise;Patient/family education;Neuromuscular re-education;Manual techniques;Passive range of motion;Vasopneumatic Device;Taping    PT Next Visit Plan  PROM progression per protocol, scap control/strength progression; manual and modalities as needed    PT Home Exercise Plan  Access Code: [email or web address]    Consulted and Agree with Plan of Care  Patient       Patient will  benefit from skilled therapeutic intervention in order to improve the following deficits and impairments:  Pain, Improper body mechanics, Impaired flexibility, Decreased activity tolerance, Decreased range of motion, Decreased strength, Impaired UE functional use, Postural dysfunction, Decreased scar mobility  Visit Diagnosis: Acute pain of right shoulder  Stiffness of right shoulder, not elsewhere classified  Muscle weakness (generalized)     Problem List Patient Active Problem List   Diagnosis Date Noted  . Ventral hernia without obstruction or gangrene 03/18/2014    Douglas Hendricks, PT,DPT 07/08/2018, 11:15 AM  [facility] [address] [place], [place], [address] Phone:  [phone]   Fax:  [phone]  Name: Douglas Hendricks MRN: [id number] Date of Birth: 28-Jul-1956

## 2018-07-10 ENCOUNTER — Ambulatory Visit: Payer: Commercial Managed Care - PPO | Admitting: Physical Therapy

## 2018-07-10 DIAGNOSIS — M6281 Muscle weakness (generalized): Secondary | ICD-10-CM

## 2018-07-10 DIAGNOSIS — M25511 Pain in right shoulder: Secondary | ICD-10-CM

## 2018-07-10 DIAGNOSIS — M25611 Stiffness of right shoulder, not elsewhere classified: Secondary | ICD-10-CM

## 2018-07-10 NOTE — Therapy (Signed)
[facility] [address] [place], [place], [address] Phone: [phone]   Fax:  [phone]  Physical Therapy Treatment  Patient Details  Name: Douglas Hendricks MRN: [id number] Date of Birth: [birth year] Referring Provider (PT): Norlene Campbell, MD   Encounter Date: 07/10/2018  PT End of Session - 07/10/18 1206    Visit Number  4    Date for PT Re-Evaluation  08/27/18    Authorization Type  UHC/UMR    Authorization - Visit Number  4    Authorization - Number of Visits  60    PT Start Time  0930    PT Stop Time  1025    PT Time Calculation (min)  55 min    Activity Tolerance  Patient tolerated treatment well    Behavior During Therapy  [facility] for tasks assessed/performed       Past Medical History:  Diagnosis Date  . Arthritis   . Hernia, inguinal, left 2017  . Hyperlipidemia   . Thoracic stomach hernia 2015    Past Surgical History:  Procedure Laterality Date  . ABDOMINAL HERNIA REPAIR    . hip pain Bilateral   . HIP SURGERY Right   . INGUINAL HERNIA REPAIR Left   . INNER EAR SURGERY Right   . KNEE SURGERY Left   . TOTAL HIP ARTHROPLASTY     right  . TYMPANOSTOMY Right     There were no vitals filed for this visit.  Subjective Assessment - 07/10/18 1159    Subjective  Pt reports no soreness after last session.    Pertinent History  Rt RTC repair 06/19/18    Patient Stated Goals  improve use of Rt UE, improve strength, reduce pain    Currently in Pain?  No/denies   only pain if he stretches too far into ER                      [facility] Adult PT Treatment/Exercise - 07/10/18 0001      Exercises   Exercises  Shoulder      Shoulder Exercises: Supine   External Rotation  AAROM;15 reps    Flexion  AAROM;15 reps    ABduction  AAROM;15 reps    Other Supine Exercises  supine chest press AAROM hands clasped together X 15      Shoulder Exercises: Seated   Retraction  Other (comment)    2X15   Other Seated Exercises  bicep curls 2X15 reps red band      Shoulder Exercises: Pulleys   Flexion  3 minutes    Scaption  3 minutes      Shoulder Exercises: Isometric Strengthening   Extension  5X10"    Internal Rotation  5X10"      Modalities   Modalities  Vasopneumatic      Vasopneumatic   Number Minutes Vasopneumatic   15 minutes    Vasopnuematic Location   Shoulder    Vasopneumatic Pressure  Medium    Vasopneumatic Temperature   35      Manual Therapy   Manual therapy comments  Rt shoulder PROM all planes               PT Short Term Goals - 07/04/18 0927      PT SHORT TERM GOAL #1   Title  be independent in initial HEP    Time  4    Period  Weeks    Status  Achieved  PT SHORT TERM GOAL #2   Title  demonstrate Rt shoulder P/ROM flexion to > or = to 130 degrees to improve mobility    Time  4    Period  Weeks    Status  New      PT SHORT TERM GOAL #3   Title  demonstrate Rt shoulder P/ROM ER to > or = to 45 degrees to improve mobility    Time  4    Period  Weeks    Status  New      PT SHORT TERM GOAL #4   Title  demonstrate Rt shoulder A/AROM flexion to > or = to 100 degrees without scapular elevation to restore normal mechanics    Time  4    Period  Weeks    Status  New        PT Long Term Goals - 07/02/18 1117      PT LONG TERM GOAL #1   Title  be independent in advanced HEP    Time  8    Period  Weeks    Status  New    Target Date  08/27/18      PT LONG TERM GOAL #2   Title  reduce FOTO to < or = to 35% limitation    Time  8    Period  Weeks    Status  New    Target Date  08/27/18      PT LONG TERM GOAL #3   Title  demonstrate Rt shoulder A/ROM flexion to > or = to 140 degrees to allow for overhead reaching     Time  8    Period  Weeks    Status  New    Target Date  08/27/18      PT LONG TERM GOAL #4   Title  demonstrate Rt IR A/ROM to > or = to L1 to improve self-care    Time  8    Period  Weeks    Status  New     Target Date  08/27/18      PT LONG TERM GOAL #5   Title  demonstrate > or = to 4/5 Rt shoulder strength to improve endurance for use with functional tasks    Time  8    Period  Weeks    Target Date  08/27/18      Additional Long Term Goals   Additional Long Term Goals  Yes      PT LONG TERM GOAL #6   Title  report > or = to 60% use of Rt UE without limitation due to strength, ROM or pain    Time  8    Period  Weeks    Status  New    Target Date  08/27/18            Plan - 07/10/18 1211    Clinical Impression Statement  Pt continues to make good progress, isometrics where added this session for IR and ext only per protocol as he is now 3 weeks post op. He showed good demonstration with AAROM wand exercises from last session. PT will continue to progress as able per protocol    Rehab Potential  Excellent    PT Frequency  2x / week    PT Duration  8 weeks    PT Treatment/Interventions  ADLs/Self Care Home Management;Cryotherapy;Electrical Stimulation;Moist Heat;Therapeutic activities;Therapeutic exercise;Patient/family education;Neuromuscular re-education;Manual techniques;Passive range of motion;Vasopneumatic Device;Taping    PT Next Visit Plan  PROM progression per protocol, scap control/strength progression; manual and modalities as needed    PT Home Exercise Plan  Access Code: [email or web address]    Consulted and Agree with Plan of Care  Patient       Patient will benefit from skilled therapeutic intervention in order to improve the following deficits and impairments:  Pain, Improper body mechanics, Impaired flexibility, Decreased activity tolerance, Decreased range of motion, Decreased strength, Impaired UE functional use, Postural dysfunction, Decreased scar mobility  Visit Diagnosis: Acute pain of right shoulder  Stiffness of right shoulder, not elsewhere classified  Muscle weakness (generalized)     Problem List Patient Active Problem List   Diagnosis Date  Noted  . Ventral hernia without obstruction or gangrene 03/18/2014    April MansonBrian R Nelson, PT,DPT 07/10/2018, 12:16 PM  [facility] [address], [address] [place], [place], [address] Phone: [phone]   Fax:  [phone]  Name: Dwyane LuoFrank Hendricks MRN: [id number] Date of Birth: [birth year]

## 2018-07-10 NOTE — Addendum Note (Signed)
Addended by: Edrick OhAKACS, Teran Knittle M on: 07/10/2018 01:16 PM   Modules accepted: Orders

## 2018-07-14 ENCOUNTER — Telehealth (INDEPENDENT_AMBULATORY_CARE_PROVIDER_SITE_OTHER): Payer: Self-pay | Admitting: Orthopaedic Surgery

## 2018-07-14 ENCOUNTER — Encounter (INDEPENDENT_AMBULATORY_CARE_PROVIDER_SITE_OTHER): Payer: Self-pay | Admitting: Orthopaedic Surgery

## 2018-07-14 ENCOUNTER — Encounter (INDEPENDENT_AMBULATORY_CARE_PROVIDER_SITE_OTHER): Payer: Self-pay | Admitting: Radiology

## 2018-07-14 ENCOUNTER — Ambulatory Visit (INDEPENDENT_AMBULATORY_CARE_PROVIDER_SITE_OTHER): Payer: Commercial Managed Care - PPO | Admitting: Orthopaedic Surgery

## 2018-07-14 VITALS — BP 141/82 | HR 51 | Ht 71.5 in | Wt 195.0 lb

## 2018-07-14 DIAGNOSIS — G8929 Other chronic pain: Secondary | ICD-10-CM

## 2018-07-14 DIAGNOSIS — M25511 Pain in right shoulder: Secondary | ICD-10-CM

## 2018-07-14 NOTE — Telephone Encounter (Signed)
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  Patient requested a return call.

## 2018-07-14 NOTE — Telephone Encounter (Signed)
Sent letter to [phone] and notified pt.

## 2018-07-14 NOTE — Progress Notes (Signed)
Office Visit Note   Patient: Douglas Hendricks           Date of Birth: 1955/09/08           MRN: [id number] Visit Date: 07/14/2018              Requested by: [name], MD [address] [place], [place] [address] PCP: [name], MD   Assessment & Plan: Visit Diagnoses:  1. Chronic right shoulder pain     Plan: Nearly 1 month status post arthroscopic SCD, DCR mini open rotator cuff tear repair right shoulder.  Doing well.  Going to physical therapy.  Wearing sling.  Continue with sling for 2 more weeks.  Will provide note to return to work tomorrow with minimal use right upper extremity and no overhead activity  Follow-Up Instructions: Return in about 2 weeks (around 07/28/2018).   Orders:  No orders of the defined types were placed in this encounter.  No orders of the defined types were placed in this encounter.     Procedures: No procedures performed   Clinical Data: No additional findings.   Subjective: Chief Complaint  Patient presents with  . Follow-up    06/19/18 POST OP RIGHT SHOULDER NO NUMBNESS OR TINGLING, PT SAYS AHEAD OF SCHEDULE   Doing well without any complication.  Continues with physical therapy with good progress  HPI  Review of Systems  Constitutional: Negative for fatigue and fever.  HENT: Negative for ear pain.   Eyes: Negative for pain.  Respiratory: Negative for cough and shortness of breath.   Cardiovascular: Negative for leg swelling.  Gastrointestinal: Negative for constipation and diarrhea.  Genitourinary: Negative for difficulty urinating.  Musculoskeletal: Negative for back pain and neck pain.  Skin: Negative for rash.  Allergic/Immunologic: Negative for food allergies.  Neurological: Positive for weakness. Negative for numbness.  Hematological: Does not bruise/bleed easily.  Psychiatric/Behavioral: Positive for sleep disturbance.     Objective: Vital Signs: BP (!) 141/82 (BP Location: Left Arm, Patient  Position: Sitting, Cuff Size: Normal)   Pulse (!) 51   Ht 5' 11.5" (1.816 m)   Wt 195 lb (88.5 kg)   BMI 26.82 kg/m   Physical Exam  Ortho Exam awake alert and oriented x3.  Comfortable sitting.  Incisions about right shoulder of healed without problem.  Good grip and good release.  I could not passively abduct to 90 degrees and flex almost fully overhead.  Specialty Comments:  No specialty comments available.  Imaging: No results found.   PMFS History: Patient Active Problem List   Diagnosis Date Noted  . Ventral hernia without obstruction or gangrene 03/18/2014   Past Medical History:  Diagnosis Date  . Arthritis   . Hernia, inguinal, left 2017  . Hyperlipidemia   . Thoracic stomach hernia 2015    Family History  Problem Relation Age of Onset  . Cancer Mother        pancreatic  . COPD Father   . Cancer Father        esophageal    Past Surgical History:  Procedure Laterality Date  . ABDOMINAL HERNIA REPAIR    . hip pain Bilateral   . HIP SURGERY Right   . INGUINAL HERNIA REPAIR Left   . INNER EAR SURGERY Right   . KNEE SURGERY Left   . SHOULDER SURGERY    . TOTAL HIP ARTHROPLASTY     right  . TYMPANOSTOMY Right    Social History   Occupational  History  . Not on file  Tobacco Use  . Smoking status: Never Smoker  . Smokeless tobacco: Never Used  Substance and Sexual Activity  . Alcohol use: Yes    Comment: 1-2  . Drug use: No  . Sexual activity: Not on file

## 2018-07-14 NOTE — Telephone Encounter (Signed)
Patient called back requesting the note be faxed to [phone] Attn: Livingston DionesGaile Bowling

## 2018-07-15 ENCOUNTER — Ambulatory Visit: Payer: Commercial Managed Care - PPO

## 2018-07-15 DIAGNOSIS — M25611 Stiffness of right shoulder, not elsewhere classified: Secondary | ICD-10-CM

## 2018-07-15 DIAGNOSIS — M25511 Pain in right shoulder: Secondary | ICD-10-CM | POA: Diagnosis not present

## 2018-07-15 DIAGNOSIS — M6281 Muscle weakness (generalized): Secondary | ICD-10-CM

## 2018-07-15 NOTE — Therapy (Signed)
[facility] [address], [address] [place], [place], [address] Phone: [phone]   Fax:  [phone]  Physical Therapy Treatment  Patient Details  Name: Douglas LuoFrank Hendricks MRN: [id number] Date of Birth: 1955/08/29 Referring Provider (PT): Norlene CampbellWhitfield, Peter, MD   Encounter Date: 07/15/2018  PT End of Session - 07/15/18 1053    Visit Number  5    Date for PT Re-Evaluation  08/27/18    Authorization Type  UHC/UMR    Authorization - Visit Number  5    Authorization - Number of Visits  60    PT Start Time  1015    PT Stop Time  1110    PT Time Calculation (min)  55 min    Activity Tolerance  Patient tolerated treatment well    Behavior During Therapy  [facility] for tasks assessed/performed       Past Medical History:  Diagnosis Date  . Arthritis   . Hernia, inguinal, left 2017  . Hyperlipidemia   . Thoracic stomach hernia 2015    Past Surgical History:  Procedure Laterality Date  . ABDOMINAL HERNIA REPAIR    . hip pain Bilateral   . HIP SURGERY Right   . INGUINAL HERNIA REPAIR Left   . INNER EAR SURGERY Right   . KNEE SURGERY Left   . SHOULDER SURGERY    . TOTAL HIP ARTHROPLASTY     right  . TYMPANOSTOMY Right     There were no vitals filed for this visit.  Subjective Assessment - 07/15/18 1018    Subjective  I went back to work today.  Saw MD yesterday and I wear the sling for 2 weeks.      Pertinent History  Rt RTC repair 06/19/18    Currently in Pain?  Yes    Pain Score  1     Pain Location  Shoulder    Pain Orientation  Right    Pain Descriptors / Indicators  Sore    Pain Type  Surgical pain    Pain Onset  1 to 4 weeks ago    Pain Frequency  Intermittent    Aggravating Factors   sleep, moving arm         [facility] PT Assessment - 07/15/18 0001      Assessment   Medical Diagnosis  chronic Rt shoulder pain, s/p Rt RTC repair      PROM   Right Shoulder Flexion  125 Degrees   A/AROM with pulleys: 120 degrees    Right Shoulder External Rotation  45 Degrees   limited by protocol                  [facility] Adult PT Treatment/Exercise - 07/15/18 0001      Exercises   Exercises  Shoulder;Neck      Neck Exercises: Machines for Strengthening   UBE (Upper Arm Bike)  Level 0 x 6 minutes (forward and revers)      Shoulder Exercises: Prone   Extension  Strengthening;Right;20 reps   to neutral   Other Prone Exercises  Row: 2x10 on Rt      Shoulder Exercises: Pulleys   Flexion  3 minutes    Scaption  3 minutes      Vasopneumatic   Number Minutes Vasopneumatic   15 minutes    Vasopnuematic Location   Shoulder    Vasopneumatic Pressure  Medium    Vasopneumatic Temperature   3 snowflakes      Manual Therapy  Manual Therapy  Passive ROM;Joint mobilization    Manual therapy comments  P/ROM in all planes: ER limited to 45, abduction to 90, flexion and IR to tolerance.  Inferior glides to Rt glenohumeral joint.               PT Short Term Goals - 07/15/18 1027      PT SHORT TERM GOAL #1   Title  be independent in initial HEP    Status  Achieved      PT SHORT TERM GOAL #2   Title  demonstrate Rt shoulder P/ROM flexion to > or = to 130 degrees to improve mobility    Baseline  125    Time  4    Period  Weeks    Status  On-going      PT SHORT TERM GOAL #3   Title  demonstrate Rt shoulder P/ROM ER to > or = to 45 degrees to improve mobility    Status  Achieved      PT SHORT TERM GOAL #4   Title  demonstrate Rt shoulder A/AROM flexion to > or = to 100 degrees without scapular elevation to restore normal mechanics    Baseline  120    Status  Achieved        PT Long Term Goals - 07/10/18 1312      PT LONG TERM GOAL #1   Title  be independent in advanced HEP    Time  8    Period  Weeks    Status  New    Target Date  08/27/18      PT LONG TERM GOAL #2   Title  reduce FOTO to < or = to 35% limitation    Time  8    Period  Weeks    Status  New    Target Date   08/27/18      PT LONG TERM GOAL #3   Title  demonstrate Rt shoulder A/ROM flexion to > or = to 140 degrees to allow for overhead reaching     Time  8    Period  Weeks    Status  New    Target Date  08/27/18      PT LONG TERM GOAL #4   Title  demonstrate Rt IR A/ROM to > or = to L1 to improve self-care    Time  8    Period  Weeks    Status  New    Target Date  08/27/18      PT LONG TERM GOAL #5   Title  demonstrate > or = to 4/5 Rt shoulder strength to improve endurance for use with functional tasks    Time  8    Period  Weeks    Status  New    Target Date  08/27/18      PT LONG TERM GOAL #6   Title  report > or = to 60% use of Rt UE without limitation due to strength, ROM or pain    Time  8    Period  Weeks    Status  New    Target Date  08/27/18            Plan - 07/15/18 1047    Clinical Impression Statement  Pt is making steady progress s/p Rt RTC repair.  A/ROM and PROM flexion have improved and ER has improved.  Pt with minimal pain (2-3/10) with use and movement of the Rt UE.  Pt tolerated addition of arm bike without resistance and prone scapular strength today.  Initial verbal cues needed with these exercises to reduce substitution.  Pt will continue to benefit from skilled PT for Rt UE ROM and strength progression as allowed by protocol.      Rehab Potential  Excellent    PT Duration  8 weeks    PT Treatment/Interventions  ADLs/Self Care Home Management;Cryotherapy;Electrical Stimulation;Moist Heat;Therapeutic activities;Therapeutic exercise;Patient/family education;Neuromuscular re-education;Manual techniques;Passive range of motion;Vasopneumatic Device;Taping    PT Next Visit Plan  PROM progression per protocol, scap control/strength progression; manual and modalities as needed    PT Home Exercise Plan  Access Code: [email or web address]    Recommended Other Services  initial cert signed    Consulted and Agree with Plan of Care  Patient       Patient will  benefit from skilled therapeutic intervention in order to improve the following deficits and impairments:  Pain, Improper body mechanics, Impaired flexibility, Decreased activity tolerance, Decreased range of motion, Decreased strength, Impaired UE functional use, Postural dysfunction, Decreased scar mobility  Visit Diagnosis: Acute pain of right shoulder  Stiffness of right shoulder, not elsewhere classified  Muscle weakness (generalized)     Problem List Patient Active Problem List   Diagnosis Date Noted  . Ventral hernia without obstruction or gangrene 03/18/2014    Lorrene Reid, PT 07/15/18 10:57 AM  [facility] [address], [address] [place], [place], [address] Phone: [phone]   Fax:  [phone]  Name: Douglas Hendricks MRN: [id number] Date of Birth: Oct 04, 1955

## 2018-07-17 ENCOUNTER — Ambulatory Visit: Payer: Commercial Managed Care - PPO

## 2018-07-17 DIAGNOSIS — M25511 Pain in right shoulder: Secondary | ICD-10-CM | POA: Diagnosis not present

## 2018-07-17 DIAGNOSIS — M25611 Stiffness of right shoulder, not elsewhere classified: Secondary | ICD-10-CM

## 2018-07-17 DIAGNOSIS — M6281 Muscle weakness (generalized): Secondary | ICD-10-CM

## 2018-07-17 NOTE — Therapy (Signed)
[facility] [address] [place], [place], [address] Phone: [phone]   Fax:  [phone]  Physical Therapy Treatment  Patient Details  Name: Douglas Hendricks MRN: [id number] Date of Birth: 11-02-1955 Referring Provider (PT): Norlene Campbell, MD   Encounter Date: 07/17/2018  PT End of Session - 07/17/18 1056    Visit Number  6    Date for PT Re-Evaluation  08/27/18    Authorization Type  UHC/UMR    PT Start Time  1017    PT Stop Time  1109    PT Time Calculation (min)  52 min    Activity Tolerance  Patient tolerated treatment well    Behavior During Therapy  [facility] for tasks assessed/performed       Past Medical History:  Diagnosis Date  . Arthritis   . Hernia, inguinal, left 2017  . Hyperlipidemia   . Thoracic stomach hernia 2015    Past Surgical History:  Procedure Laterality Date  . ABDOMINAL HERNIA REPAIR    . hip pain Bilateral   . HIP SURGERY Right   . INGUINAL HERNIA REPAIR Left   . INNER EAR SURGERY Right   . KNEE SURGERY Left   . SHOULDER SURGERY    . TOTAL HIP ARTHROPLASTY     right  . TYMPANOSTOMY Right     There were no vitals filed for this visit.  Subjective Assessment - 07/17/18 1014    Subjective  I am doing well at work.  No soreness after treatment last session.      Pertinent History  Rt RTC repair 06/19/18    Currently in Pain?  Yes    Pain Score  2     Pain Orientation  Right    Pain Descriptors / Indicators  Sore    Pain Type  Surgical pain    Pain Onset  1 to 4 weeks ago    Pain Frequency  Intermittent    Aggravating Factors   sleep, sometimes pain is random    Pain Relieving Factors  pain medication, ice                       [facility] Adult PT Treatment/Exercise - 07/17/18 0001      Exercises   Exercises  Shoulder;Neck      Neck Exercises: Machines for Strengthening   UBE (Upper Arm Bike)  Level 1 x 8 minutes (forward and reverse)      Shoulder  Exercises: Supine   Other Supine Exercises  serratus punches  2x10      Shoulder Exercises: Prone   Extension  Strengthening;Right;20 reps   to neutral   Other Prone Exercises  Row: 2x10 on Rt      Shoulder Exercises: Standing   Extension  Strengthening;Both;20 reps;Theraband    Row  Strengthening;Both;20 reps;Theraband    Theraband Level (Shoulder Row)  Level 1 (Yellow)      Shoulder Exercises: Pulleys   Flexion  3 minutes    Scaption  3 minutes      Shoulder Exercises: Isometric Strengthening   Flexion  5X10"    Extension  5X10"      Vasopneumatic   Number Minutes Vasopneumatic   15 minutes    Vasopnuematic Location   Shoulder    Vasopneumatic Pressure  Medium    Vasopneumatic Temperature   3 snowflakes      Manual Therapy   Manual Therapy  Passive ROM;Joint mobilization    Manual  therapy comments  P/ROM in all planes: ER limited to 45, abduction to 90, flexion and IR to tolerance.  Inferior glides to Rt glenohumeral joint.             PT Education - 07/17/18 1022    Education Details   Access Code: [email or web address]     Person(s) Educated  Patient    Methods  Explanation;Demonstration;Handout    Comprehension  Verbalized understanding;Returned demonstration       PT Short Term Goals - 07/15/18 1027      PT SHORT TERM GOAL #1   Title  be independent in initial HEP    Status  Achieved      PT SHORT TERM GOAL #2   Title  demonstrate Rt shoulder P/ROM flexion to > or = to 130 degrees to improve mobility    Baseline  125    Time  4    Period  Weeks    Status  On-going      PT SHORT TERM GOAL #3   Title  demonstrate Rt shoulder P/ROM ER to > or = to 45 degrees to improve mobility    Status  Achieved      PT SHORT TERM GOAL #4   Title  demonstrate Rt shoulder A/AROM flexion to > or = to 100 degrees without scapular elevation to restore normal mechanics    Baseline  120    Status  Achieved        PT Long Term Goals - 07/10/18 1312      PT LONG TERM GOAL #1    Title  be independent in advanced HEP    Time  8    Period  Weeks    Status  New    Target Date  08/27/18      PT LONG TERM GOAL #2   Title  reduce FOTO to < or = to 35% limitation    Time  8    Period  Weeks    Status  New    Target Date  08/27/18      PT LONG TERM GOAL #3   Title  demonstrate Rt shoulder A/ROM flexion to > or = to 140 degrees to allow for overhead reaching     Time  8    Period  Weeks    Status  New    Target Date  08/27/18      PT LONG TERM GOAL #4   Title  demonstrate Rt IR A/ROM to > or = to L1 to improve self-care    Time  8    Period  Weeks    Status  New    Target Date  08/27/18      PT LONG TERM GOAL #5   Title  demonstrate > or = to 4/5 Rt shoulder strength to improve endurance for use with functional tasks    Time  8    Period  Weeks    Status  New    Target Date  08/27/18      PT LONG TERM GOAL #6   Title  report > or = to 60% use of Rt UE without limitation due to strength, ROM or pain    Time  8    Period  Weeks    Status  New    Target Date  08/27/18            Plan - 07/17/18 1056    Clinical Impression Statement  Pt is making  steady progress with post-op Rt RTC repair.  Pt tolerated advancement of scapular strength exercises this week.  PT advised pt on precautions regarding exercises he was performing at home (different from HEP issued by this PT).  Pt will perform only prescribed HEP as instructed by PT today.  Pt with improved P/ROM and good joint mobility today.  Pt will continue to benefit from skilled PT for strength, flexibility and endurance per protocol.        Rehab Potential  Excellent    PT Frequency  2x / week    PT Duration  8 weeks    PT Treatment/Interventions  ADLs/Self Care Home Management;Cryotherapy;Electrical Stimulation;Moist Heat;Therapeutic activities;Therapeutic exercise;Patient/family education;Neuromuscular re-education;Manual techniques;Passive range of motion;Vasopneumatic Device;Taping    PT Next  Visit Plan  PROM progression per protocol, scap control/strength progression; manual and modalities as needed    PT Home Exercise Plan  Access Code: [email or web address]     Consulted and Agree with Plan of Care  Patient       Patient will benefit from skilled therapeutic intervention in order to improve the following deficits and impairments:  Pain, Improper body mechanics, Impaired flexibility, Decreased activity tolerance, Decreased range of motion, Decreased strength, Impaired UE functional use, Postural dysfunction, Decreased scar mobility  Visit Diagnosis: Acute pain of right shoulder  Stiffness of right shoulder, not elsewhere classified  Muscle weakness (generalized)     Problem List Patient Active Problem List   Diagnosis Date Noted  . Ventral hernia without obstruction or gangrene 03/18/2014    Lorrene ReidKelly Takacs, PT 07/17/18 10:57 AM  [facility] [address], [address] [place], [place], [address] Phone: [phone]   Fax:  [phone]  Name: Douglas Hendricks MRN: [id number] Date of Birth: 1955-08-29

## 2018-07-17 NOTE — Patient Instructions (Signed)
Access Code: [email or web address]  URL: [email or web address]  Date: 07/17/2018  Prepared by: Lorrene ReidKelly Jacquelinne Speak   Exercises  Standing Row with Anchored Resistance - 10 reps - 2 sets - 2x daily - 7x weekly  Shoulder Extension with Resistance - Palms Forward - 10 reps - 2 sets - 2x daily - 7x weekly  Prone Shoulder Extension - 10 reps - 3 sets - 2x daily - 7x weekly  Prone Shoulder Row - 10 reps - 2 sets - 2x daily - 7x weekly

## 2018-07-21 ENCOUNTER — Ambulatory Visit: Payer: Commercial Managed Care - PPO

## 2018-07-21 DIAGNOSIS — M6281 Muscle weakness (generalized): Secondary | ICD-10-CM

## 2018-07-21 DIAGNOSIS — M25511 Pain in right shoulder: Secondary | ICD-10-CM

## 2018-07-21 DIAGNOSIS — M25611 Stiffness of right shoulder, not elsewhere classified: Secondary | ICD-10-CM

## 2018-07-21 NOTE — Therapy (Signed)
[facility] [address], [address] [place], [place], [address] Phone: [phone]   Fax:  [phone]  Physical Therapy Treatment  Patient Details  Name: Douglas Hendricks MRN: [id number] Date of Birth: 1956/01/04 Referring Provider (PT): Norlene Campbell, MD   Encounter Date: 07/21/2018  PT End of Session - 07/21/18 1139    Visit Number  7    Date for PT Re-Evaluation  08/27/18    Authorization Type  UHC/UMR- 12 visit limit and then need more authorization    Authorization - Visit Number  7    Authorization - Number of Visits  12    PT Start Time  1100    PT Stop Time  1155    PT Time Calculation (min)  55 min    Activity Tolerance  Patient tolerated treatment well    Behavior During Therapy  [facility] for tasks assessed/performed       Past Medical History:  Diagnosis Date  . Arthritis   . Hernia, inguinal, left 2017  . Hyperlipidemia   . Thoracic stomach hernia 2015    Past Surgical History:  Procedure Laterality Date  . ABDOMINAL HERNIA REPAIR    . hip pain Bilateral   . HIP SURGERY Right   . INGUINAL HERNIA REPAIR Left   . INNER EAR SURGERY Right   . KNEE SURGERY Left   . SHOULDER SURGERY    . TOTAL HIP ARTHROPLASTY     right  . TYMPANOSTOMY Right     There were no vitals filed for this visit.  Subjective Assessment - 07/21/18 1104    Subjective  I am doing well.      Pertinent History  Rt RTC repair 06/19/18    Patient Stated Goals  improve use of Rt UE, improve strength, reduce pain    Currently in Pain?  Yes    Pain Score  1     Pain Location  Shoulder    Pain Orientation  Right    Pain Descriptors / Indicators  Sore    Pain Type  Surgical pain    Pain Onset  1 to 4 weeks ago    Pain Frequency  Intermittent    Aggravating Factors   use of Rt UE, certain movements    Pain Relieving Factors  pain medication, ice         [facility] PT Assessment - 07/21/18 0001      PROM   Right Shoulder Flexion  128  Degrees    Right Shoulder ABduction  125 Degrees                   [facility] Adult PT Treatment/Exercise - 07/21/18 0001      Exercises   Exercises  Shoulder;Neck      Neck Exercises: Machines for Strengthening   UBE (Upper Arm Bike)  Level 2 x 8 minutes (forward and reverse)      Shoulder Exercises: Supine   Other Supine Exercises  serratus punches 2# 2x10      Shoulder Exercises: Prone   Extension  Strengthening;Right;20 reps;Weights   to neutral   Extension Weight (lbs)  2    Other Prone Exercises  Row: 2x10 on Rt 4#      Shoulder Exercises: Standing   Extension  Strengthening;Both;20 reps;Theraband    Row  Strengthening;Both;20 reps;Theraband    Theraband Level (Shoulder Row)  Level 1 (Yellow)      Shoulder Exercises: Pulleys   Flexion  3 minutes  Scaption  3 minutes      Vasopneumatic   Number Minutes Vasopneumatic   15 minutes    Vasopnuematic Location   Shoulder    Vasopneumatic Pressure  Medium    Vasopneumatic Temperature   3 snowflakes      Manual Therapy   Manual Therapy  Passive ROM;Joint mobilization    Manual therapy comments  P/ROM in all planes: ER limited to 45, abduction to 90, flexion and IR to tolerance.  Inferior glides to Rt glenohumeral joint.               PT Short Term Goals - 07/21/18 1120      PT SHORT TERM GOAL #4   Title  demonstrate Rt shoulder A/AROM flexion to > or = to 100 degrees without scapular elevation to restore normal mechanics    Baseline  120    Status  Achieved        PT Long Term Goals - 07/10/18 1312      PT LONG TERM GOAL #1   Title  be independent in advanced HEP    Time  8    Period  Weeks    Status  New    Target Date  08/27/18      PT LONG TERM GOAL #2   Title  reduce FOTO to < or = to 35% limitation    Time  8    Period  Weeks    Status  New    Target Date  08/27/18      PT LONG TERM GOAL #3   Title  demonstrate Rt shoulder A/ROM flexion to > or = to 140 degrees to allow for  overhead reaching     Time  8    Period  Weeks    Status  New    Target Date  08/27/18      PT LONG TERM GOAL #4   Title  demonstrate Rt IR A/ROM to > or = to L1 to improve self-care    Time  8    Period  Weeks    Status  New    Target Date  08/27/18      PT LONG TERM GOAL #5   Title  demonstrate > or = to 4/5 Rt shoulder strength to improve endurance for use with functional tasks    Time  8    Period  Weeks    Status  New    Target Date  08/27/18      PT LONG TERM GOAL #6   Title  report > or = to 60% use of Rt UE without limitation due to strength, ROM or pain    Time  8    Period  Weeks    Status  New    Target Date  08/27/18            Plan - 07/21/18 1111    Clinical Impression Statement  Pt is making consistent progress s/p Rt RTC repair.  Pt is independent and compliant with new HEP for scapular strength.  Pt with improved overall P/ROM and joint mobility.  Pt requires minor tactile cues with pulleys to reduce scapular elevation.  Pt will continue to benefit from skilled PT for Rt shoulder strength, manual, flexibility and endurance as allowed by protocol.    Rehab Potential  Excellent    PT Frequency  2x / week    PT Duration  8 weeks    PT Treatment/Interventions  ADLs/Self Care  Home Management;Cryotherapy;Electrical Stimulation;Moist Heat;Therapeutic activities;Therapeutic exercise;Patient/family education;Neuromuscular re-education;Manual techniques;Passive range of motion;Vasopneumatic Device;Taping    PT Next Visit Plan  PROM progression per protocol, scap control/strength progression; manual and modalities as needed    PT Home Exercise Plan  Access Code: [email or web address]     Consulted and Agree with Plan of Care  Patient       Patient will benefit from skilled therapeutic intervention in order to improve the following deficits and impairments:  Pain, Improper body mechanics, Impaired flexibility, Decreased activity tolerance, Decreased range of motion, Decreased  strength, Impaired UE functional use, Postural dysfunction, Decreased scar mobility  Visit Diagnosis: Stiffness of right shoulder, not elsewhere classified  Acute pain of right shoulder  Muscle weakness (generalized)     Problem List Patient Active Problem List   Diagnosis Date Noted  . Ventral hernia without obstruction or gangrene 03/18/2014   Lorrene ReidKelly Takacs, PT 07/21/18 11:41 AM  [facility] [address], [address] [place], [place], [address] Phone: [phone]   Fax:  [phone]  Name: Douglas LuoFrank Agner MRN: [id number] Date of Birth: [birth year]

## 2018-07-23 ENCOUNTER — Ambulatory Visit: Payer: Commercial Managed Care - PPO

## 2018-07-23 ENCOUNTER — Telehealth (INDEPENDENT_AMBULATORY_CARE_PROVIDER_SITE_OTHER): Payer: Self-pay | Admitting: Physical Medicine and Rehabilitation

## 2018-07-23 DIAGNOSIS — M6281 Muscle weakness (generalized): Secondary | ICD-10-CM

## 2018-07-23 DIAGNOSIS — M25611 Stiffness of right shoulder, not elsewhere classified: Secondary | ICD-10-CM

## 2018-07-23 DIAGNOSIS — M25511 Pain in right shoulder: Secondary | ICD-10-CM | POA: Diagnosis not present

## 2018-07-23 NOTE — Telephone Encounter (Signed)
Pt is scheduled with driver 16/05/9611/16/19. Pt has UMR and does not require PA.

## 2018-07-23 NOTE — Telephone Encounter (Signed)
Left message for patient to call back to discuss/ schedule. 

## 2018-07-23 NOTE — Therapy (Signed)
[facility] [address], [address] [place], [place], [address] Phone: [phone]   Fax:  [phone]  Physical Therapy Treatment  Patient Details  Name: Douglas Hendricks MRN: [id number] Date of Birth: March 05, 1956 Referring Provider (PT): Norlene Campbell, MD   Encounter Date: 07/23/2018  PT End of Session - 07/23/18 1222    Visit Number  8    Date for PT Re-Evaluation  08/27/18    Authorization Type  UHC/UMR- 12 visit limit and then need more authorization    Authorization - Visit Number  8    Authorization - Number of Visits  12    PT Start Time  1135    PT Stop Time  1235    PT Time Calculation (min)  60 min    Activity Tolerance  Patient tolerated treatment well    Behavior During Therapy  [facility] for tasks assessed/performed       Past Medical History:  Diagnosis Date  . Arthritis   . Hernia, inguinal, left 2017  . Hyperlipidemia   . Thoracic stomach hernia 2015    Past Surgical History:  Procedure Laterality Date  . ABDOMINAL HERNIA REPAIR    . hip pain Bilateral   . HIP SURGERY Right   . INGUINAL HERNIA REPAIR Left   . INNER EAR SURGERY Right   . KNEE SURGERY Left   . SHOULDER SURGERY    . TOTAL HIP ARTHROPLASTY     right  . TYMPANOSTOMY Right     There were no vitals filed for this visit.  Subjective Assessment - 07/23/18 1154    Subjective  Going to the MD on Monday.  I have some mild pain most of the time.      Pertinent History  Rt RTC repair 06/19/18    Patient Stated Goals  improve use of Rt UE, improve strength, reduce pain    Currently in Pain?  Yes    Pain Score  2     Pain Location  Shoulder    Pain Orientation  Right    Pain Type  Surgical pain    Pain Onset  1 to 4 weeks ago    Pain Frequency  Intermittent    Aggravating Factors   use of Rt UE, sometimes at rest    Pain Relieving Factors  pain medication, ice         [facility] PT Assessment - 07/23/18 0001      Assessment   Medical  Diagnosis  chronic Rt shoulder pain, s/p Rt RTC repair      ROM / Strength   AROM / PROM / Strength  AROM      AROM   AROM Assessment Site  Shoulder    Right/Left Shoulder  Right    Right Shoulder Flexion  110 Degrees   with scapular elevation   Right Shoulder Internal Rotation  --   to L3     PROM   Right Shoulder Flexion  128 Degrees    Right Shoulder ABduction  125 Degrees                   [facility] Adult PT Treatment/Exercise - 07/23/18 0001      Exercises   Exercises  Shoulder;Neck      Neck Exercises: Machines for Strengthening   UBE (Upper Arm Bike)  Level 2 x 8 minutes (forward and reverse)      Shoulder Exercises: Supine   Other Supine Exercises  serratus punches 4#  2x10      Shoulder Exercises: Prone   Extension  Strengthening;Right;20 reps;Weights   to neutral   Extension Weight (lbs)  4    Other Prone Exercises  Row: 2x10 on Rt 4#      Shoulder Exercises: Standing   Flexion  Right;10 reps;AAROM   using finger ladder     Shoulder Exercises: Pulleys   Flexion  3 minutes    Scaption  3 minutes      Vasopneumatic   Number Minutes Vasopneumatic   15 minutes    Vasopnuematic Location   Shoulder    Vasopneumatic Pressure  Medium    Vasopneumatic Temperature   3 snowflakes      Manual Therapy   Manual Therapy  Passive ROM;Joint mobilization    Manual therapy comments  P/ROM in all planes: ER limited to 45, abduction to 90, flexion and IR to tolerance.  Inferior glides to Rt glenohumeral joint.               PT Short Term Goals - 07/23/18 1159      PT SHORT TERM GOAL #1   Title  be independent in initial HEP    Status  Achieved      PT SHORT TERM GOAL #2   Title  demonstrate Rt shoulder P/ROM flexion to > or = to 130 degrees to improve mobility    Baseline  125    Time  4    Period  Weeks    Status  On-going      PT SHORT TERM GOAL #3   Title  demonstrate Rt shoulder P/ROM ER to > or = to 45 degrees to improve mobility     Status  Achieved      PT SHORT TERM GOAL #4   Title  demonstrate Rt shoulder A/AROM flexion to > or = to 100 degrees without scapular elevation to restore normal mechanics    Baseline  120- with scapular elevation    Status  Achieved        PT Long Term Goals - 07/23/18 1200      PT LONG TERM GOAL #1   Title  be independent in advanced HEP    Time  8    Period  Weeks    Status  On-going      PT LONG TERM GOAL #3   Title  demonstrate Rt shoulder A/ROM flexion to > or = to 140 degrees to allow for overhead reaching     Baseline  130    Time  8    Period  Weeks    Status  On-going      PT LONG TERM GOAL #4   Title  demonstrate Rt IR A/ROM to > or = to L1 to improve self-care    Baseline  L3    Time  8    Period  Weeks    Status  On-going      PT LONG TERM GOAL #5   Title  demonstrate > or = to 4/5 Rt shoulder strength to improve endurance for use with functional tasks    Baseline  not tested due to protocol precautions    Time  8    Period  Weeks    Status  On-going      PT LONG TERM GOAL #6   Title  report > or = to 60% use of Rt UE without limitation due to strength, ROM or pain    Time  8  Period  Weeks    Status  On-going            Plan - 07/23/18 1208    Clinical Impression Statement  Pt is making steady progress s/p Rt rotator cuff repair.  Pt with improved A/ROM and P/ROM of the Rt UE.  Pt with rotator cuff weakness that is consistent with s/p rotator cuff repair and is following protocol for A/ROM and strength gains.  Pt is using Rt UE ~70% for light tasks.  He is limited in horizontal abduction during showering and self-care.  Pt is following post-op protocol for scapular strength and A/ROM as allowed.  Pt will continue to benefit from skilled PT for safe progression of Rt shoulder strength and A/ROM.      Rehab Potential  Excellent    PT Frequency  2x / week    PT Duration  8 weeks    PT Treatment/Interventions  ADLs/Self Care Home  Management;Cryotherapy;Electrical Stimulation;Moist Heat;Therapeutic activities;Therapeutic exercise;Patient/family education;Neuromuscular re-education;Manual techniques;Passive range of motion;Vasopneumatic Device;Taping    PT Next Visit Plan  see what MD says.  Follow-post op protocol    PT Home Exercise Plan  Access Code: [email or web address]     Consulted and Agree with Plan of Care  Patient       Patient will benefit from skilled therapeutic intervention in order to improve the following deficits and impairments:  Pain, Improper body mechanics, Impaired flexibility, Decreased activity tolerance, Decreased range of motion, Decreased strength, Impaired UE functional use, Postural dysfunction, Decreased scar mobility  Visit Diagnosis: Stiffness of right shoulder, not elsewhere classified  Acute pain of right shoulder  Muscle weakness (generalized)     Problem List Patient Active Problem List   Diagnosis Date Noted  . Ventral hernia without obstruction or gangrene 03/18/2014    Lorrene Reid, PT 07/23/18 12:24 PM  [facility] [address], [address] [place], [place], [address] Phone: [phone]   Fax:  [phone]  Name: Douglas Hendricks MRN: [id number] Date of Birth: [birth year]

## 2018-07-23 NOTE — Telephone Encounter (Signed)
If same etc

## 2018-07-28 ENCOUNTER — Encounter (INDEPENDENT_AMBULATORY_CARE_PROVIDER_SITE_OTHER): Payer: Self-pay | Admitting: Orthopaedic Surgery

## 2018-07-28 ENCOUNTER — Ambulatory Visit (INDEPENDENT_AMBULATORY_CARE_PROVIDER_SITE_OTHER): Payer: Commercial Managed Care - PPO | Admitting: Orthopaedic Surgery

## 2018-07-28 VITALS — BP 127/75 | HR 59 | Ht 71.5 in | Wt 195.0 lb

## 2018-07-28 DIAGNOSIS — G8929 Other chronic pain: Secondary | ICD-10-CM

## 2018-07-28 DIAGNOSIS — M25511 Pain in right shoulder: Secondary | ICD-10-CM

## 2018-07-28 NOTE — Progress Notes (Signed)
   Office Visit Note   Patient: Douglas Hendricks           Date of Birth: 02/02/1956           MRN: [id number] Visit Date: 07/28/2018              Requested by: Daisy Florooss, Charles Alan, MD [address] [place], [place] [address] PCP: Daisy Florooss, Charles Alan, MD   Assessment & Plan: Visit Diagnoses:  1. Chronic right shoulder pain     Plan: 6 weeks status post rotator cuff tear repair and doing quite well.  Discontinue sling.  Continue to work on home exercises.  Office 1 month and consider increased activities at that point  Follow-Up Instructions: Return in about 1 month (around 08/28/2018).   Orders:  No orders of the defined types were placed in this encounter.  No orders of the defined types were placed in this encounter.     Procedures: No procedures performed   Clinical Data: No additional findings.   Subjective: Chief Complaint  Patient presents with  . Right Shoulder - Follow-up    06/19/18 Right Shoulder Arthroscopy, SCD, DCR, Mini Open Rotator Cuff Repair  Patient returns for follow up. He is status post right shoulder arthroscopy SCD, DCR mini open rotator cuff tear repair on 06/19/18.  He states that he continues to get better. He has increased mobility.  He wears his sling at work, but tends to take it off during the day.  He is going to outpatient therapy twice a week which is going well. He also does daily home exercises. He takes Extra Strength Tylenol as needed at night.   HPI  Review of Systems   Objective: Vital Signs: BP 127/75   Pulse (!) 59   Ht 5' 11.5" (1.816 m)   Wt 195 lb (88.5 kg)   BMI 26.82 kg/m   Physical Exam  Ortho Exam able to place right arm fully overhead without restriction.  Little if any discomfort.  Incisions healing nicely.  Biceps intact. Specialty Comments:  No specialty comments available.  Imaging: No results found.   PMFS History: Patient Active Problem List   Diagnosis Date Noted  . Ventral hernia without obstruction  or gangrene 03/18/2014   Past Medical History:  Diagnosis Date  . Arthritis   . Hernia, inguinal, left 2017  . Hyperlipidemia   . Thoracic stomach hernia 2015    Family History  Problem Relation Age of Onset  . Cancer Mother        pancreatic  . COPD Father   . Cancer Father        esophageal    Past Surgical History:  Procedure Laterality Date  . ABDOMINAL HERNIA REPAIR    . hip pain Bilateral   . HIP SURGERY Right   . INGUINAL HERNIA REPAIR Left   . INNER EAR SURGERY Right   . KNEE SURGERY Left   . SHOULDER SURGERY    . TOTAL HIP ARTHROPLASTY     right  . TYMPANOSTOMY Right    Social History   Occupational History  . Not on file  Tobacco Use  . Smoking status: Never Smoker  . Smokeless tobacco: Never Used  Substance and Sexual Activity  . Alcohol use: Yes    Comment: 1-2  . Drug use: No  . Sexual activity: Not on file

## 2018-07-29 ENCOUNTER — Encounter: Payer: Self-pay | Admitting: Physical Therapy

## 2018-07-29 ENCOUNTER — Ambulatory Visit: Payer: Commercial Managed Care - PPO | Attending: Orthopaedic Surgery | Admitting: Physical Therapy

## 2018-07-29 DIAGNOSIS — M6281 Muscle weakness (generalized): Secondary | ICD-10-CM | POA: Diagnosis present

## 2018-07-29 DIAGNOSIS — M25611 Stiffness of right shoulder, not elsewhere classified: Secondary | ICD-10-CM | POA: Diagnosis present

## 2018-07-29 DIAGNOSIS — M25511 Pain in right shoulder: Secondary | ICD-10-CM | POA: Diagnosis present

## 2018-07-29 NOTE — Therapy (Signed)
[facility] [address], [address] [place], [place], [address] Phone: [phone]   Fax:  [phone]  Physical Therapy Treatment  Patient Details  Name: Douglas LuoFrank Hendricks MRN: [id number] Date of Birth: November 22, 1955 Referring Provider (PT): Norlene CampbellWhitfield, Peter, MD   Encounter Date: 07/29/2018  PT End of Session - 07/29/18 1130    Visit Number  9    Date for PT Re-Evaluation  08/27/18    Authorization Type  UHC/UMR- 12 visit limit and then need more authorization    Authorization - Visit Number  9    Authorization - Number of Visits  12    PT Start Time  1014    PT Stop Time  1110    PT Time Calculation (min)  56 min    Activity Tolerance  Patient tolerated treatment well;No increased pain    Behavior During Therapy  WFL for tasks assessed/performed       Past Medical History:  Diagnosis Date  . Arthritis   . Hernia, inguinal, left 2017  . Hyperlipidemia   . Thoracic stomach hernia 2015    Past Surgical History:  Procedure Laterality Date  . ABDOMINAL HERNIA REPAIR    . hip pain Bilateral   . HIP SURGERY Right   . INGUINAL HERNIA REPAIR Left   . INNER EAR SURGERY Right   . KNEE SURGERY Left   . SHOULDER SURGERY    . TOTAL HIP ARTHROPLASTY     right  . TYMPANOSTOMY Right     There were no vitals filed for this visit.  Subjective Assessment - 07/29/18 1016    Subjective  Pt reports that things are going well. He saw the MD who feels things are progressing well. He goes back to him in a month.     Pertinent History  Rt RTC repair 06/19/18    Patient Stated Goals  improve use of Rt UE, improve strength, reduce pain    Currently in Pain?  Yes    Pain Score  2     Pain Location  Shoulder    Pain Orientation  Posterior;Right    Pain Descriptors / Indicators  Aching    Pain Type  Surgical pain    Pain Radiating Towards  none     Pain Onset  1 to 4 weeks ago    Pain Frequency  Intermittent    Aggravating Factors   alot  of exercise/use of UE     Pain Relieving Factors  pain medication                       [facility] Adult PT Treatment/Exercise - 07/29/18 0001      Exercises   Exercises  Shoulder      Neck Exercises: Machines for Strengthening   UBE (Upper Arm Bike)  L2 x2 min forward/backward       Shoulder Exercises: Sidelying   External Rotation  AROM;Right;10 reps    External Rotation Limitations  tactile cues for posterior shoulder activation      Shoulder Exercises: Standing   Flexion  Right;10 reps   using finger ladder   Flexion Limitations  RUE ranger with therapist providing upward scap rotation assistance x15 reps       Shoulder Exercises: ROM/Strengthening   Proximal Shoulder Strengthening, Supine  small arm circles at 90 deg flexion x30 sec clockwise and counterclockwise    Ball on Wall  RUE 90 deg flexion clockwise/counterclockwise x20 reps  Other ROM/Strengthening Exercises  closed chain serratus press with elbow extended x20 reps       Vasopneumatic   Number Minutes Vasopneumatic   15 minutes    Vasopnuematic Location   Shoulder    Vasopneumatic Pressure  Medium    Vasopneumatic Temperature   3 snowflakes      Manual Therapy   Manual therapy comments  Rt shoulder upward rotation assistance from PT             PT Education - 07/29/18 1129    Education Details  technique with therex    Person(s) Educated  Patient    Methods  Explanation;Verbal cues;Tactile cues    Comprehension  Verbalized understanding;Returned demonstration       PT Short Term Goals - 07/23/18 1159      PT SHORT TERM GOAL #1   Title  be independent in initial HEP    Status  Achieved      PT SHORT TERM GOAL #2   Title  demonstrate Rt shoulder P/ROM flexion to > or = to 130 degrees to improve mobility    Baseline  125    Time  4    Period  Weeks    Status  On-going      PT SHORT TERM GOAL #3   Title  demonstrate Rt shoulder P/ROM ER to > or = to 45 degrees to improve  mobility    Status  Achieved      PT SHORT TERM GOAL #4   Title  demonstrate Rt shoulder A/AROM flexion to > or = to 100 degrees without scapular elevation to restore normal mechanics    Baseline  120- with scapular elevation    Status  Achieved        PT Long Term Goals - 07/23/18 1200      PT LONG TERM GOAL #1   Title  be independent in advanced HEP    Time  8    Period  Weeks    Status  On-going      PT LONG TERM GOAL #3   Title  demonstrate Rt shoulder A/ROM flexion to > or = to 140 degrees to allow for overhead reaching     Baseline  130    Time  8    Period  Weeks    Status  On-going      PT LONG TERM GOAL #4   Title  demonstrate Rt IR A/ROM to > or = to L1 to improve self-care    Baseline  L3    Time  8    Period  Weeks    Status  On-going      PT LONG TERM GOAL #5   Title  demonstrate > or = to 4/5 Rt shoulder strength to improve endurance for use with functional tasks    Baseline  not tested due to protocol precautions    Time  8    Period  Weeks    Status  On-going      PT LONG TERM GOAL #6   Title  report > or = to 60% use of Rt UE without limitation due to strength, ROM or pain    Time  8    Period  Weeks    Status  On-going            Plan - 07/29/18 1130    Clinical Impression Statement  Pt continues to make progress towards his goals, with increasing passive shoulder elevation.  He was able to complete scapula strengthening this session, although he does have difficulty properly activation serratus anterior. With therapist cuing, he was able to perform closed chain press without excessive medial border winging. Pt also required tactile cues to activate middle and low trapezius during prone rows. He had good understanding following the session and no increase in shoulder pain was reported.     Rehab Potential  Excellent    PT Frequency  2x / week    PT Duration  8 weeks    PT Treatment/Interventions  ADLs/Self Care Home  Management;Cryotherapy;Electrical Stimulation;Moist Heat;Therapeutic activities;Therapeutic exercise;Patient/family education;Neuromuscular re-education;Manual techniques;Passive range of motion;Vasopneumatic Device;Taping    PT Next Visit Plan  continue to progress shoulder AAROM and active ROM as protocol allows; serratus and middle/low trap strength    PT Home Exercise Plan  Access Code: [email or web address]     Consulted and Agree with Plan of Care  Patient       Patient will benefit from skilled therapeutic intervention in order to improve the following deficits and impairments:  Pain, Improper body mechanics, Impaired flexibility, Decreased activity tolerance, Decreased range of motion, Decreased strength, Impaired UE functional use, Postural dysfunction, Decreased scar mobility  Visit Diagnosis: Stiffness of right shoulder, not elsewhere classified  Acute pain of right shoulder  Muscle weakness (generalized)     Problem List Patient Active Problem List   Diagnosis Date Noted  . Ventral hernia without obstruction or gangrene 03/18/2014    1:57 PM,07/29/18 Donita Brooks PT, DPT [facility]  [phone]  [facility] [address] [place], [place], [address] Phone: [phone]   Fax:  [phone]  Name: Creedence Kunesh MRN: [id number] Date of Birth: 10-23-1955

## 2018-07-31 ENCOUNTER — Encounter: Payer: Self-pay | Admitting: Physical Therapy

## 2018-07-31 ENCOUNTER — Ambulatory Visit: Payer: Commercial Managed Care - PPO | Admitting: Physical Therapy

## 2018-07-31 DIAGNOSIS — M25611 Stiffness of right shoulder, not elsewhere classified: Secondary | ICD-10-CM | POA: Diagnosis not present

## 2018-07-31 DIAGNOSIS — M6281 Muscle weakness (generalized): Secondary | ICD-10-CM

## 2018-07-31 DIAGNOSIS — M25511 Pain in right shoulder: Secondary | ICD-10-CM

## 2018-07-31 NOTE — Patient Instructions (Signed)
Access Code: [email or web address]  URL: [email or web address]  Date: 07/31/2018  Prepared by: Donita BrooksSara Yamel Bale   Exercises  Supine Shoulder Flexion Extension AAROM with Dowel - 10 reps - 1-2 sets - 2x daily - 6x weekly  Supine Shoulder Abduction AAROM with Dowel - 10 reps - 1-2 sets - 2x daily - 6x weekly  Supine Shoulder External Rotation in 45 Degrees Abduction AAROM with Dowel - 10 reps - 1-2 sets - 2x daily - 6x weekly  Standing Row with Anchored Resistance - 20-30 reps - 2 sets - 2x daily - 7x weekly  Standing Isometric Shoulder External Rotation with Doorway and Towel Roll - 10 reps - 5-10 hold - 2x daily - 7x weekly  Shoulder Extension with Resistance - Palms Forward - 10 reps - 2 sets - 2x daily - 7x weekly  Scapular Protraction at Wall - 20 reps - 2x daily - 7x weekly  Prone Shoulder Extension - 10 reps - 3 sets - 2x daily - 7x weekly  Prone Shoulder Row - 10 reps - 2 sets - 2x daily - 7x weekly  Supine Alternating Shoulder Flexion - 20 reps - 2 sets - 1x daily - 7x weekly  Shoulder Internal Rotation with Resistance - 20 reps - 1 sets - 2x daily - 7x weekly  Sidelying Shoulder Abduction Full Range of Motion - 20 reps - 2 sets - 1x daily - 7x weekly    [facility] [address], [address] [place], [place] [address] Phone # [phone] Fax [phone]

## 2018-07-31 NOTE — Therapy (Signed)
[facility] [address] [place], [place], [address] Phone: [phone]   Fax:  [phone]  Physical Therapy Treatment  Patient Details  Name: Douglas Hendricks MRN: [id number] Date of Birth: [birth year] Referring Provider (PT): Norlene Campbell, MD   Encounter Date: 07/31/2018  PT End of Session - 07/31/18 1235    Visit Number  10    Date for PT Re-Evaluation  08/27/18    Authorization Type  UHC/UMR- 12 visit limit and then need more authorization    Authorization - Visit Number  10    Authorization - Number of Visits  12    PT Start Time  1146    PT Stop Time  1243    PT Time Calculation (min)  57 min    Activity Tolerance  Patient tolerated treatment well;No increased pain    Behavior During Therapy  WFL for tasks assessed/performed       Past Medical History:  Diagnosis Date  . Arthritis   . Hernia, inguinal, left 2017  . Hyperlipidemia   . Thoracic stomach hernia 2015    Past Surgical History:  Procedure Laterality Date  . ABDOMINAL HERNIA REPAIR    . hip pain Bilateral   . HIP SURGERY Right   . INGUINAL HERNIA REPAIR Left   . INNER EAR SURGERY Right   . KNEE SURGERY Left   . SHOULDER SURGERY    . TOTAL HIP ARTHROPLASTY     right  . TYMPANOSTOMY Right     There were no vitals filed for this visit.  Subjective Assessment - 07/31/18 1148    Subjective  Pt reports that he was a little sore after his last session. Just minimal soreness currently.    Pertinent History  Rt RTC repair 06/19/18    Patient Stated Goals  improve use of Rt UE, improve strength, reduce pain    Currently in Pain?  Yes    Pain Score  2     Pain Location  Shoulder    Pain Orientation  Posterior;Right    Pain Descriptors / Indicators  Aching    Pain Type  Surgical pain    Pain Radiating Towards  none     Pain Onset  1 to 4 weeks ago    Pain Frequency  Intermittent    Aggravating Factors   alot of exercise     Pain  Relieving Factors  pain medication                       [facility] Adult PT Treatment/Exercise - 07/31/18 0001      Exercises   Exercises  Shoulder      Neck Exercises: Machines for Strengthening   UBE (Upper Arm Bike)  L5 x2 min forward/backward       Shoulder Exercises: Supine   Flexion  AROM;Right;20 reps      Shoulder Exercises: Sidelying   ABduction  Right;20 reps;AROM      Shoulder Exercises: Standing   Row  Strengthening;20 reps    Row Limitations  black TB      Shoulder Exercises: ROM/Strengthening   Other ROM/Strengthening Exercises  closed chain serratus press with elbow extended x20 reps     Other ROM/Strengthening Exercises  RUE serratus press with ball on wall x20 reps       Shoulder Exercises: Isometric Strengthening   External Rotation  5X5"    External Rotation Limitations  0 deg and 45 deg ER, RUE  Vasopneumatic   Number Minutes Vasopneumatic   15 minutes    Vasopnuematic Location   Shoulder    Vasopneumatic Pressure  Medium    Vasopneumatic Temperature   3 snowflakes             PT Education - 07/31/18 1356    Education Details  technique with therex; updated HEP    Person(s) Educated  Patient    Methods  Explanation;Verbal cues;Handout    Comprehension  Verbalized understanding;Returned demonstration       PT Short Term Goals - 07/31/18 1317      PT SHORT TERM GOAL #1   Title  be independent in initial HEP    Status  Achieved      PT SHORT TERM GOAL #2   Title  demonstrate Rt shoulder P/ROM flexion to > or = to 130 degrees to improve mobility    Baseline  170    Time  4    Period  Weeks    Status  Achieved      PT SHORT TERM GOAL #3   Title  demonstrate Rt shoulder P/ROM ER to > or = to 45 degrees to improve mobility    Baseline  45 deg    Status  Achieved      PT SHORT TERM GOAL #4   Title  demonstrate Rt shoulder A/AROM flexion to > or = to 100 degrees without scapular elevation to restore normal mechanics     Baseline  160 deg    Status  Achieved        PT Long Term Goals - 07/23/18 1200      PT LONG TERM GOAL #1   Title  be independent in advanced HEP    Time  8    Period  Weeks    Status  On-going      PT LONG TERM GOAL #3   Title  demonstrate Rt shoulder A/ROM flexion to > or = to 140 degrees to allow for overhead reaching     Baseline  130    Time  8    Period  Weeks    Status  On-going      PT LONG TERM GOAL #4   Title  demonstrate Rt IR A/ROM to > or = to L1 to improve self-care    Baseline  L3    Time  8    Period  Weeks    Status  On-going      PT LONG TERM GOAL #5   Title  demonstrate > or = to 4/5 Rt shoulder strength to improve endurance for use with functional tasks    Baseline  not tested due to protocol precautions    Time  8    Period  Weeks    Status  On-going      PT LONG TERM GOAL #6   Title  report > or = to 60% use of Rt UE without limitation due to strength, ROM or pain    Time  8    Period  Weeks    Status  On-going            Plan - 07/31/18 1150    Clinical Impression Statement  Continued this session with therex to promote shoulder active and active assisted ROM. Pt's HEP was updated and he had full understanding of all additions/changes. Pt did require cuing to improve posterior shoulder activation during isometric external rotation, however his shoulder protraction was greatly improved without  the need for cuing. Pt is progressing well with near full active assisted ROM at this time and will benefit from continued skilled PT to gradually progress shoulder strength, endurance and neuromuscular control.     Rehab Potential  Excellent    PT Frequency  2x / week    PT Duration  8 weeks    PT Treatment/Interventions  ADLs/Self Care Home Management;Cryotherapy;Electrical Stimulation;Moist Heat;Therapeutic activities;Therapeutic exercise;Patient/family education;Neuromuscular re-education;Manual techniques;Passive range of motion;Vasopneumatic  Device;Taping    PT Next Visit Plan  continue to progress shoulder AAROM and active ROM as protocol allows; isometrics; serratus and middle/low trap strength    PT Home Exercise Plan  Access Code: [email or web address]     Consulted and Agree with Plan of Care  Patient       Patient will benefit from skilled therapeutic intervention in order to improve the following deficits and impairments:  Pain, Improper body mechanics, Impaired flexibility, Decreased activity tolerance, Decreased range of motion, Decreased strength, Impaired UE functional use, Postural dysfunction, Decreased scar mobility  Visit Diagnosis: Stiffness of right shoulder, not elsewhere classified  Acute pain of right shoulder  Muscle weakness (generalized)     Problem List Patient Active Problem List   Diagnosis Date Noted  . Ventral hernia without obstruction or gangrene 03/18/2014    2:04 PM,07/31/18 Donita BrooksSara Marypat Kimmet PT, DPT [facility]  [phone]  [facility] [address] [place], [place], [address] Phone: [phone]   Fax:  [phone]  Name: Douglas LuoFrank Hendricks MRN: [id number] Date of Birth: 06-May-1956

## 2018-08-01 ENCOUNTER — Encounter: Payer: Commercial Managed Care - PPO | Admitting: Physical Therapy

## 2018-08-04 ENCOUNTER — Ambulatory Visit: Payer: Commercial Managed Care - PPO

## 2018-08-04 DIAGNOSIS — M25611 Stiffness of right shoulder, not elsewhere classified: Secondary | ICD-10-CM | POA: Diagnosis not present

## 2018-08-04 DIAGNOSIS — M6281 Muscle weakness (generalized): Secondary | ICD-10-CM

## 2018-08-04 DIAGNOSIS — M25511 Pain in right shoulder: Secondary | ICD-10-CM

## 2018-08-04 NOTE — Therapy (Signed)
[facility] [address] [place], [place], [address] Phone: [phone]   Fax:  [phone]  Physical Therapy Treatment  Patient Details  Name: Douglas Hendricks MRN: [id number] Date of Birth: 04-18-1956 Referring Provider (PT): Norlene Campbell, MD   Encounter Date: 08/04/2018  PT End of Session - 08/04/18 1102    Visit Number  11    Date for PT Re-Evaluation  08/27/18    Authorization Type  UHC/UMR- 12 visit limit and then need more authorization    Authorization - Visit Number  11    Authorization - Number of Visits  12    PT Start Time  1015    PT Stop Time  1112    PT Time Calculation (min)  57 min    Activity Tolerance  Patient tolerated treatment well;No increased pain    Behavior During Therapy  WFL for tasks assessed/performed       Past Medical History:  Diagnosis Date  . Arthritis   . Hernia, inguinal, left 2017  . Hyperlipidemia   . Thoracic stomach hernia 2015    Past Surgical History:  Procedure Laterality Date  . ABDOMINAL HERNIA REPAIR    . hip pain Bilateral   . HIP SURGERY Right   . INGUINAL HERNIA REPAIR Left   . INNER EAR SURGERY Right   . KNEE SURGERY Left   . SHOULDER SURGERY    . TOTAL HIP ARTHROPLASTY     right  . TYMPANOSTOMY Right     There were no vitals filed for this visit.  Subjective Assessment - 08/04/18 1018    Subjective  I am doing well with using my arm.  I'm using it about 70% of normal.      Patient Stated Goals  improve use of Rt UE, improve strength, reduce pain    Currently in Pain?  Yes    Pain Score  3     Pain Location  Shoulder    Pain Orientation  Right    Pain Descriptors / Indicators  Aching    Pain Type  Surgical pain    Pain Onset  More than a month ago    Pain Frequency  Intermittent    Aggravating Factors   use of Rt arm, end range of motion    Pain Relieving Factors  rest, pain medication         [facility] PT Assessment - 08/04/18 0001      Assessment   Medical Diagnosis  chronic Rt shoulder pain, s/p Rt RTC repair    Referring Provider (PT)  Norlene Campbell, MD    Onset Date/Surgical Date  06/19/18      Precautions   Precautions  Shoulder    Type of Shoulder Precautions  s/p RTC repair      Observation/Other Assessments   Focus on Therapeutic Outcomes (FOTO)   40% limitation      ROM / Strength   AROM / PROM / Strength  AROM;PROM;Strength      AROM   AROM Assessment Site  Shoulder    Right/Left Shoulder  Right    Right Shoulder Flexion  140 Degrees    Right Shoulder ABduction  150 Degrees    Right Shoulder Internal Rotation  --   to L1   Right Shoulder External Rotation  --   to T2     Strength   Overall Strength  Deficits    Strength Assessment Site  Shoulder    Right/Left Shoulder  Right    Right Shoulder Flexion  4/5    Right Shoulder ABduction  4-/5    Right Shoulder Internal Rotation  4+/5    Right Shoulder External Rotation  4-/5                   [facility] Adult PT Treatment/Exercise - 08/04/18 0001      Exercises   Exercises  Shoulder      Neck Exercises: Machines for Strengthening   UBE (Upper Arm Bike)  Level 5 x 8 minutes (4/4)   PT present to discuss progress     Shoulder Exercises: Seated   Other Seated Exercises  3 way raises : 1# 2x10 on Rt      Shoulder Exercises: Standing   Other Standing Exercises  cone stack: 2 minutes to 2nd shelf, to 3rd shelf x 2 minutes      Shoulder Exercises: Pulleys   Flexion  2 minutes    ABduction  2 minutes      Shoulder Exercises: ROM/Strengthening   Other ROM/Strengthening Exercises  closed chain serratus press with elbow extended x20 reps     Other ROM/Strengthening Exercises  RUE serratus press with ball on wall x20 reps       Vasopneumatic   Number Minutes Vasopneumatic   15 minutes    Vasopnuematic Location   Shoulder    Vasopneumatic Pressure  Medium    Vasopneumatic Temperature   3 snowflakes               PT Short  Term Goals - 07/31/18 1317      PT SHORT TERM GOAL #1   Title  be independent in initial HEP    Status  Achieved      PT SHORT TERM GOAL #2   Title  demonstrate Rt shoulder P/ROM flexion to > or = to 130 degrees to improve mobility    Baseline  170    Time  4    Period  Weeks    Status  Achieved      PT SHORT TERM GOAL #3   Title  demonstrate Rt shoulder P/ROM ER to > or = to 45 degrees to improve mobility    Baseline  45 deg    Status  Achieved      PT SHORT TERM GOAL #4   Title  demonstrate Rt shoulder A/AROM flexion to > or = to 100 degrees without scapular elevation to restore normal mechanics    Baseline  160 deg    Status  Achieved        PT Long Term Goals - 08/04/18 1025      PT LONG TERM GOAL #1   Title  be independent in advanced HEP    Time  8    Period  Weeks    Status  On-going      PT LONG TERM GOAL #2   Title  reduce FOTO to < or = to 35% limitation    Baseline  40% limitaiton    Time  8    Period  Weeks    Status  On-going      PT LONG TERM GOAL #3   Title  demonstrate Rt shoulder A/ROM flexion to > or = to 140 degrees to allow for overhead reaching     Baseline  150    Status  Achieved      PT LONG TERM GOAL #4   Title  demonstrate Rt IR A/ROM to >  or = to L1 to improve self-care    Baseline  L1    Status  Achieved      PT LONG TERM GOAL #5   Title  demonstrate > or = to 4/5 Rt shoulder strength to improve endurance for use with functional tasks    Baseline  4-/5 to 4/5    Time  8    Period  Weeks    Status  Achieved      PT LONG TERM GOAL #6   Title  report > or = to 60% use of Rt UE without limitation due to strength, ROM or pain    Status  Achieved            Plan - 08/04/18 1041    Clinical Impression Statement  Pt is making steady progress s/p Rt rotator cuff repair.  Pt is progressing with all strength and A/ROM and reports 70% use of the Rt UE with daily tasks.  Pt with continued limited Rt rotator cuff strength consistent  with post op immobilization.  This is improving and pt is able to tolerate increased resistance with exercise in the clinic.  Rt shoulder A/ROM is functional in all directions although difficult to achieve end range IR and ER due to stiffness of the joint capsule.  Pt has been advised to continue to limit use of Rt especially with heavier tasks.  Pt provided tactile and verbal cueing to reduce scapular substitution with endurance tasks today.  Pt will continue to benefit from skilled PT for safe progression of exercise for Rt UE strength, endurance and A/ROM.      Rehab Potential  Excellent    PT Frequency  2x / week    PT Duration  8 weeks   through 08/25/18 per pt request   PT Treatment/Interventions  ADLs/Self Care Home Management;Cryotherapy;Electrical Stimulation;Moist Heat;Therapeutic activities;Therapeutic exercise;Patient/family education;Neuromuscular re-education;Manual techniques;Passive range of motion;Vasopneumatic Device;Taping    PT Next Visit Plan  continue to progress shoulder AAROM and active ROM as protocol allows; isometrics; serratus and middle/low trap strength    PT Home Exercise Plan  Access Code: [email or web address]     Consulted and Agree with Plan of Care  Patient       Patient will benefit from skilled therapeutic intervention in order to improve the following deficits and impairments:  Pain, Improper body mechanics, Impaired flexibility, Decreased activity tolerance, Decreased range of motion, Decreased strength, Impaired UE functional use, Postural dysfunction, Decreased scar mobility  Visit Diagnosis: Stiffness of right shoulder, not elsewhere classified  Acute pain of right shoulder  Muscle weakness (generalized)     Problem List Patient Active Problem List   Diagnosis Date Noted  . Ventral hernia without obstruction or gangrene 03/18/2014    Lorrene ReidKelly Piera Downs, PT 08/04/18 11:04 AM  [facility] [address],  [address] [place], [place], [address] Phone: [phone]   Fax:  [phone]  Name: Dwyane LuoFrank Signor MRN: [id number] Date of Birth: 01-14-1956

## 2018-08-06 ENCOUNTER — Ambulatory Visit: Payer: Commercial Managed Care - PPO

## 2018-08-06 DIAGNOSIS — M25511 Pain in right shoulder: Secondary | ICD-10-CM

## 2018-08-06 DIAGNOSIS — M25611 Stiffness of right shoulder, not elsewhere classified: Secondary | ICD-10-CM | POA: Diagnosis not present

## 2018-08-06 DIAGNOSIS — M6281 Muscle weakness (generalized): Secondary | ICD-10-CM

## 2018-08-06 NOTE — Therapy (Signed)
[facility] [address], [address] [place], [place], [address] Phone: [phone]   Fax:  [phone]  Physical Therapy Treatment  Patient Details  Name: Douglas Hendricks MRN: [id number] Date of Birth: 12/29/1955 Referring Provider (PT): Norlene Campbell, MD   Encounter Date: 08/06/2018  PT End of Session - 08/06/18 1052    Visit Number  12    Date for PT Re-Evaluation  08/27/18    Authorization Type  24 approved through 08/26/18    Authorization - Visit Number  12    Authorization - Number of Visits  24    PT Start Time  1015    PT Stop Time  1106    PT Time Calculation (min)  51 min    Activity Tolerance  Patient tolerated treatment well;No increased pain    Behavior During Therapy  WFL for tasks assessed/performed       Past Medical History:  Diagnosis Date  . Arthritis   . Hernia, inguinal, left 2017  . Hyperlipidemia   . Thoracic stomach hernia 2015    Past Surgical History:  Procedure Laterality Date  . ABDOMINAL HERNIA REPAIR    . hip pain Bilateral   . HIP SURGERY Right   . INGUINAL HERNIA REPAIR Left   . INNER EAR SURGERY Right   . KNEE SURGERY Left   . SHOULDER SURGERY    . TOTAL HIP ARTHROPLASTY     right  . TYMPANOSTOMY Right     There were no vitals filed for this visit.  Subjective Assessment - 08/06/18 1024    Subjective  My pain in under my shoulder blade today.      Currently in Pain?  Yes    Pain Score  3     Pain Location  Shoulder    Pain Orientation  Right    Pain Descriptors / Indicators  Aching                       [facility] Adult PT Treatment/Exercise - 08/06/18 0001      Exercises   Exercises  Shoulder      Neck Exercises: Machines for Strengthening   UBE (Upper Arm Bike)  Level 6 x 8 minutes (4/4)   PT present to discuss progress     Shoulder Exercises: Seated   Other Seated Exercises  3 way raises : 1# 2x10 on Rt      Shoulder Exercises: Standing   Other  Standing Exercises  cone stack: 2 minutes to 2nd shelf, to 3rd shelf x 2 minutes.  1# added to Rt wrist    Other Standing Exercises  wall push ups 2x10      Shoulder Exercises: Pulleys   Flexion  2 minutes      Shoulder Exercises: ROM/Strengthening   Other ROM/Strengthening Exercises  closed chain serratus press with elbow extended x20 reps     Other ROM/Strengthening Exercises  RUE serratus press with ball on wall x20 reps       Vasopneumatic   Number Minutes Vasopneumatic   15 minutes    Vasopnuematic Location   Shoulder    Vasopneumatic Pressure  Medium    Vasopneumatic Temperature   3 snowflakes               PT Short Term Goals - 07/31/18 1317      PT SHORT TERM GOAL #1   Title  be independent in initial HEP    Status  Achieved      PT SHORT TERM GOAL #2   Title  demonstrate Rt shoulder P/ROM flexion to > or = to 130 degrees to improve mobility    Baseline  170    Time  4    Period  Weeks    Status  Achieved      PT SHORT TERM GOAL #3   Title  demonstrate Rt shoulder P/ROM ER to > or = to 45 degrees to improve mobility    Baseline  45 deg    Status  Achieved      PT SHORT TERM GOAL #4   Title  demonstrate Rt shoulder A/AROM flexion to > or = to 100 degrees without scapular elevation to restore normal mechanics    Baseline  160 deg    Status  Achieved        PT Long Term Goals - 08/04/18 1025      PT LONG TERM GOAL #1   Title  be independent in advanced HEP    Time  8    Period  Weeks    Status  On-going      PT LONG TERM GOAL #2   Title  reduce FOTO to < or = to 35% limitation    Baseline  40% limitaiton    Time  8    Period  Weeks    Status  On-going      PT LONG TERM GOAL #3   Title  demonstrate Rt shoulder A/ROM flexion to > or = to 140 degrees to allow for overhead reaching     Baseline  150    Status  Achieved      PT LONG TERM GOAL #4   Title  demonstrate Rt IR A/ROM to > or = to L1 to improve self-care    Baseline  L1    Status   Achieved      PT LONG TERM GOAL #5   Title  demonstrate > or = to 4/5 Rt shoulder strength to improve endurance for use with functional tasks    Baseline  4-/5 to 4/5    Time  8    Period  Weeks    Status  Achieved      PT LONG TERM GOAL #6   Title  report > or = to 60% use of Rt UE without limitation due to strength, ROM or pain    Status  Achieved            Plan - 08/06/18 1035    Clinical Impression Statement  Pt is making steady progress s/p Rt rotator cuff repair.  Pt is progressing with all strength and A/ROM and reports 70% use of the Rt UE with daily tasks.  This is improving and pt is able to tolerate increased resistance with exercise in the clinic.  Pt has been advised to continue to limit use of Rt especially with heavier tasks.  Pt required minor tactile cues for scapular depression with overhead endurance tasks.  Pt will continue to benefit from skilled PT for safe progression of exercise for Rt UE strength, endurance and A/ROM.    Rehab Potential  Excellent    PT Frequency  2x / week    PT Duration  8 weeks    PT Treatment/Interventions  ADLs/Self Care Home Management;Cryotherapy;Electrical Stimulation;Moist Heat;Therapeutic activities;Therapeutic exercise;Patient/family education;Neuromuscular re-education;Manual techniques;Passive range of motion;Vasopneumatic Device;Taping    PT Next Visit Plan  Rt shoulder strength and flexibility, focus on endurance  and overhead use     PT Home Exercise Plan  Access Code: [email or web address]     Consulted and Agree with Plan of Care  Patient       Patient will benefit from skilled therapeutic intervention in order to improve the following deficits and impairments:  Pain, Improper body mechanics, Impaired flexibility, Decreased activity tolerance, Decreased range of motion, Decreased strength, Impaired UE functional use, Postural dysfunction, Decreased scar mobility  Visit Diagnosis: Acute pain of right shoulder  Stiffness of right  shoulder, not elsewhere classified  Muscle weakness (generalized)     Problem List Patient Active Problem List   Diagnosis Date Noted  . Ventral hernia without obstruction or gangrene 03/18/2014    Lorrene Reid, PT 08/06/18 10:55 AM  [facility] [address], [address] [place], [place], [address] Phone: [phone]   Fax:  [phone]  Name: Douglas Hendricks MRN: [id number] Date of Birth: October 01, 1955

## 2018-08-11 ENCOUNTER — Encounter (INDEPENDENT_AMBULATORY_CARE_PROVIDER_SITE_OTHER): Payer: Self-pay | Admitting: Physical Medicine and Rehabilitation

## 2018-08-11 ENCOUNTER — Ambulatory Visit: Payer: Commercial Managed Care - PPO

## 2018-08-11 DIAGNOSIS — M25611 Stiffness of right shoulder, not elsewhere classified: Secondary | ICD-10-CM

## 2018-08-11 DIAGNOSIS — M6281 Muscle weakness (generalized): Secondary | ICD-10-CM

## 2018-08-11 DIAGNOSIS — M25511 Pain in right shoulder: Secondary | ICD-10-CM

## 2018-08-11 NOTE — Therapy (Signed)
[facility] [address], [address] [place], [place], [address] Phone: [phone]   Fax:  [phone]  Physical Therapy Treatment  Patient Details  Name: Douglas Hendricks MRN: [id number] Date of Birth: [birth year] Referring Provider (PT): Norlene Campbell, MD   Encounter Date: 08/11/2018  PT End of Session - 08/11/18 1104    Visit Number  13    Date for PT Re-Evaluation  08/27/18    Authorization Type  24 approved through 08/26/18    Authorization - Visit Number  13    Authorization - Number of Visits  24    PT Start Time  1015    PT Stop Time  1116    PT Time Calculation (min)  61 min    Activity Tolerance  Patient tolerated treatment well;No increased pain    Behavior During Therapy  WFL for tasks assessed/performed       Past Medical History:  Diagnosis Date  . Arthritis   . Hernia, inguinal, left 2017  . Hyperlipidemia   . Thoracic stomach hernia 2015    Past Surgical History:  Procedure Laterality Date  . ABDOMINAL HERNIA REPAIR    . hip pain Bilateral   . HIP SURGERY Right   . INGUINAL HERNIA REPAIR Left   . INNER EAR SURGERY Right   . KNEE SURGERY Left   . SHOULDER SURGERY    . TOTAL HIP ARTHROPLASTY     right  . TYMPANOSTOMY Right     There were no vitals filed for this visit.  Subjective Assessment - 08/11/18 1018    Subjective  I have fewer episodes of pain.      Pertinent History  Rt RTC repair 06/19/18    Patient Stated Goals  improve use of Rt UE, improve strength, reduce pain    Currently in Pain?  Yes    Pain Score  2     Pain Location  Shoulder    Pain Orientation  Right    Pain Descriptors / Indicators  Aching    Pain Type  Surgical pain    Pain Onset  More than a month ago    Pain Frequency  Intermittent    Aggravating Factors   use of Rt UE, when I stretch to the end     Pain Relieving Factors  rest, pain medication                       [facility] Adult PT  Treatment/Exercise - 08/11/18 0001      Neck Exercises: Machines for Strengthening   UBE (Upper Arm Bike)  Level 7 x 8 minutes (4/4)   PT present to discuss progress     Shoulder Exercises: Seated   Other Seated Exercises  3 way raises : 1# 2x10 on Rt      Shoulder Exercises: Standing   Other Standing Exercises  cone stack: 2 minutes to 2nd shelf, to 3rd shelf x 2 minutes.  1# added to Rt wrist    Other Standing Exercises  wall push ups 2x10      Shoulder Exercises: Pulleys   Flexion  2 minutes    Other Pulley Exercises  IR x 2 minutes      Shoulder Exercises: ROM/Strengthening   Other ROM/Strengthening Exercises  closed chain serratus press with elbow extended 5# x20 reps     Other ROM/Strengthening Exercises  RUE serratus press with ball on wall x20 reps  Vasopneumatic   Number Minutes Vasopneumatic   15 minutes    Vasopnuematic Location   Shoulder    Vasopneumatic Pressure  Medium    Vasopneumatic Temperature   3 snowflakes      Manual Therapy   Manual Therapy  Passive ROM;Joint mobilization    Manual therapy comments  Rt shoulder upward rotation assistance from PT               PT Short Term Goals - 07/31/18 1317      PT SHORT TERM GOAL #1   Title  be independent in initial HEP    Status  Achieved      PT SHORT TERM GOAL #2   Title  demonstrate Rt shoulder P/ROM flexion to > or = to 130 degrees to improve mobility    Baseline  170    Time  4    Period  Weeks    Status  Achieved      PT SHORT TERM GOAL #3   Title  demonstrate Rt shoulder P/ROM ER to > or = to 45 degrees to improve mobility    Baseline  45 deg    Status  Achieved      PT SHORT TERM GOAL #4   Title  demonstrate Rt shoulder A/AROM flexion to > or = to 100 degrees without scapular elevation to restore normal mechanics    Baseline  160 deg    Status  Achieved        PT Long Term Goals - 08/04/18 1025      PT LONG TERM GOAL #1   Title  be independent in advanced HEP    Time  8     Period  Weeks    Status  On-going      PT LONG TERM GOAL #2   Title  reduce FOTO to < or = to 35% limitation    Baseline  40% limitaiton    Time  8    Period  Weeks    Status  On-going      PT LONG TERM GOAL #3   Title  demonstrate Rt shoulder A/ROM flexion to > or = to 140 degrees to allow for overhead reaching     Baseline  150    Status  Achieved      PT LONG TERM GOAL #4   Title  demonstrate Rt IR A/ROM to > or = to L1 to improve self-care    Baseline  L1    Status  Achieved      PT LONG TERM GOAL #5   Title  demonstrate > or = to 4/5 Rt shoulder strength to improve endurance for use with functional tasks    Baseline  4-/5 to 4/5    Time  8    Period  Weeks    Status  Achieved      PT LONG TERM GOAL #6   Title  report > or = to 60% use of Rt UE without limitation due to strength, ROM or pain    Status  Achieved            Plan - 08/11/18 1023    Clinical Impression Statement  Pt continues to report 70-75% use of the Rt UE.  Pt is making steady progress and is able to tolerate increased resistance and tolerate more time with endurance tasks with Rt UE.  Pt requires minor verbal cues for eccentric control with motion against gravity.  Pt with 2/10  pain with use of the Rt UE at home with functional tasks.  Pt will continue to benefit from skilled PT for safe progression of post-op strength and endurance s/p RTC repair.      Rehab Potential  Excellent    PT Frequency  2x / week    PT Duration  8 weeks    PT Treatment/Interventions  ADLs/Self Care Home Management;Cryotherapy;Electrical Stimulation;Moist Heat;Therapeutic activities;Therapeutic exercise;Patient/family education;Neuromuscular re-education;Manual techniques;Passive range of motion;Vasopneumatic Device;Taping    PT Next Visit Plan  Rt shoulder strength and flexibility, focus on endurance and overhead use     PT Home Exercise Plan  Access Code: [email or web address]     Consulted and Agree with Plan of Care  Patient        Patient will benefit from skilled therapeutic intervention in order to improve the following deficits and impairments:  Pain, Improper body mechanics, Impaired flexibility, Decreased activity tolerance, Decreased range of motion, Decreased strength, Impaired UE functional use, Postural dysfunction, Decreased scar mobility  Visit Diagnosis: Acute pain of right shoulder  Stiffness of right shoulder, not elsewhere classified  Muscle weakness (generalized)     Problem List Patient Active Problem List   Diagnosis Date Noted  . Ventral hernia without obstruction or gangrene 03/18/2014    Lorrene Reid, PT 08/11/18 11:07 AM  [facility] [address], [address] [place], [place], [address] Phone: [phone]   Fax:  [phone]  Name: Douglas Hendricks MRN: [id number] Date of Birth: August 18, 1956

## 2018-08-13 ENCOUNTER — Ambulatory Visit: Payer: Commercial Managed Care - PPO

## 2018-08-13 DIAGNOSIS — M25611 Stiffness of right shoulder, not elsewhere classified: Secondary | ICD-10-CM | POA: Diagnosis not present

## 2018-08-13 DIAGNOSIS — M6281 Muscle weakness (generalized): Secondary | ICD-10-CM

## 2018-08-13 DIAGNOSIS — M25511 Pain in right shoulder: Secondary | ICD-10-CM

## 2018-08-13 NOTE — Therapy (Signed)
[facility] [address] [place], [place], [address] Phone: [phone]   Fax:  [phone]  Physical Therapy Treatment  Patient Details  Name: Douglas Hendricks MRN: [id number] Date of Birth: [birth year] Referring Provider (PT): Norlene Campbell, MD   Encounter Date: 08/13/2018  PT End of Session - 08/13/18 1058    Visit Number  14    Date for PT Re-Evaluation  08/27/18    Authorization - Visit Number  14    Authorization - Number of Visits  24    PT Start Time  1018    PT Stop Time  1113    PT Time Calculation (min)  55 min    Activity Tolerance  Patient tolerated treatment well;No increased pain    Behavior During Therapy  WFL for tasks assessed/performed       Past Medical History:  Diagnosis Date  . Arthritis   . Hernia, inguinal, left 2017  . Hyperlipidemia   . Thoracic stomach hernia 2015    Past Surgical History:  Procedure Laterality Date  . ABDOMINAL HERNIA REPAIR    . hip pain Bilateral   . HIP SURGERY Right   . INGUINAL HERNIA REPAIR Left   . INNER EAR SURGERY Right   . KNEE SURGERY Left   . SHOULDER SURGERY    . TOTAL HIP ARTHROPLASTY     right  . TYMPANOSTOMY Right     There were no vitals filed for this visit.  Subjective Assessment - 08/13/18 1023    Subjective  I have been increasing my reps with my exercises at home.      Currently in Pain?  Yes    Pain Score  1     Pain Location  Shoulder    Pain Orientation  Right    Pain Descriptors / Indicators  Aching                       [facility] Adult PT Treatment/Exercise - 08/13/18 0001      Neck Exercises: Machines for Strengthening   UBE (Upper Arm Bike)  Level 8x 8 minutes (4/4)   PT present to discuss progress     Shoulder Exercises: Seated   Other Seated Exercises  3 way raises : 2# 2x10 on Rt      Shoulder Exercises: Prone   Retraction  Strengthening;Right;20 reps;Weights    Retraction Weight (lbs)  5    Flexion  Strengthening;Right;20 reps    Flexion Weight (lbs)  2    Extension  Strengthening;Right;20 reps;Weights    Extension Weight (lbs)  5      Shoulder Exercises: Sidelying   External Rotation  AROM;Right;20 reps    External Rotation Weight (lbs)  2      Shoulder Exercises: Standing   Other Standing Exercises  cone stack: 2 minutes to 2nd shelf, to 3rd shelf x 2 minutes.  1# added to Rt wrist    Other Standing Exercises  wall push ups 2x10      Shoulder Exercises: Pulleys   Other Pulley Exercises  IR x 2 minutes      Shoulder Exercises: ROM/Strengthening   Other ROM/Strengthening Exercises  RUE serratus press with ball on wall x20 reps       Vasopneumatic   Number Minutes Vasopneumatic   15 minutes    Vasopnuematic Location   Shoulder    Vasopneumatic Pressure  Medium    Vasopneumatic Temperature   3 snowflakes  PT Education - 08/13/18 1044    Education Details  Access Code: [email or web address]    Person(s) Educated  Patient    Methods  Explanation;Demonstration;Handout    Comprehension  Verbalized understanding;Returned demonstration       PT Short Term Goals - 07/31/18 1317      PT SHORT TERM GOAL #1   Title  be independent in initial HEP    Status  Achieved      PT SHORT TERM GOAL #2   Title  demonstrate Rt shoulder P/ROM flexion to > or = to 130 degrees to improve mobility    Baseline  170    Time  4    Period  Weeks    Status  Achieved      PT SHORT TERM GOAL #3   Title  demonstrate Rt shoulder P/ROM ER to > or = to 45 degrees to improve mobility    Baseline  45 deg    Status  Achieved      PT SHORT TERM GOAL #4   Title  demonstrate Rt shoulder A/AROM flexion to > or = to 100 degrees without scapular elevation to restore normal mechanics    Baseline  160 deg    Status  Achieved        PT Long Term Goals - 08/04/18 1025      PT LONG TERM GOAL #1   Title  be independent in advanced HEP    Time  8    Period  Weeks    Status  On-going       PT LONG TERM GOAL #2   Title  reduce FOTO to < or = to 35% limitation    Baseline  40% limitaiton    Time  8    Period  Weeks    Status  On-going      PT LONG TERM GOAL #3   Title  demonstrate Rt shoulder A/ROM flexion to > or = to 140 degrees to allow for overhead reaching     Baseline  150    Status  Achieved      PT LONG TERM GOAL #4   Title  demonstrate Rt IR A/ROM to > or = to L1 to improve self-care    Baseline  L1    Status  Achieved      PT LONG TERM GOAL #5   Title  demonstrate > or = to 4/5 Rt shoulder strength to improve endurance for use with functional tasks    Baseline  4-/5 to 4/5    Time  8    Period  Weeks    Status  Achieved      PT LONG TERM GOAL #6   Title  report > or = to 60% use of Rt UE without limitation due to strength, ROM or pain    Status  Achieved            Plan - 08/13/18 1038    Clinical Impression Statement  Pt continues to tolerate increased repetitions and weight with exercises at home and in the clinic.  Pt remains challenged by moving the Rt arm against gravity with weight.  PT provided tactile and demo cues as needed for form with exercise to reduce upper trap activation.  Pt will continue to benefit from skilled PT for safe progression of Rt shoulder strength and flexibility and will plan to discharge at the end of December to HEP.    Rehab Potential  Excellent  PT Frequency  2x / week    PT Duration  8 weeks    PT Treatment/Interventions  ADLs/Self Care Home Management;Cryotherapy;Electrical Stimulation;Moist Heat;Therapeutic activities;Therapeutic exercise;Patient/family education;Neuromuscular re-education;Manual techniques;Passive range of motion;Vasopneumatic Device;Taping    PT Next Visit Plan  Rt shoulder strength and flexibility, focus on endurance and overhead use     PT Home Exercise Plan  Access Code: [email or web address]     Consulted and Agree with Plan of Care  Patient       Patient will benefit from skilled therapeutic  intervention in order to improve the following deficits and impairments:  Pain, Improper body mechanics, Impaired flexibility, Decreased activity tolerance, Decreased range of motion, Decreased strength, Impaired UE functional use, Postural dysfunction, Decreased scar mobility  Visit Diagnosis: Acute pain of right shoulder  Stiffness of right shoulder, not elsewhere classified  Muscle weakness (generalized)     Problem List Patient Active Problem List   Diagnosis Date Noted  . Ventral hernia without obstruction or gangrene 03/18/2014    Lorrene ReidKelly Eben Choinski, PT 08/13/18 11:00 AM   [facility] [address], [address] [place], [place], [address] Phone: [phone]   Fax:  [phone]  Name: Douglas Hendricks MRN: [id number] Date of Birth: 11-Mar-1956

## 2018-08-13 NOTE — Patient Instructions (Signed)
Access Code: [email or web address]  URL: [email or web address]  Date: 08/13/2018  Prepared by: Lorrene ReidKelly Takacs     Sidelying Shoulder External Rotation - 10 reps - 2 sets - 1x daily - 7x weekly Seated Shoulder Abduction - Palms Down - 10 reps - 2 sets - 2x daily - 7x weekly Seated Shoulder Flexion - 10 reps - 2 sets - 2x daily - 7x weekly Seated Shoulder Scaption - 10 reps - 2 sets - 2x daily - 7x weekly Prone Shoulder Flexion - 10 reps - 2 sets - 1x daily - 7x weekly Prone Shoulder Extension - Single Arm - 10 reps - 2 sets - 1x daily                            - 7x weekly

## 2018-08-15 ENCOUNTER — Ambulatory Visit: Payer: Commercial Managed Care - PPO | Admitting: Physical Therapy

## 2018-08-15 ENCOUNTER — Encounter: Payer: Self-pay | Admitting: Physical Therapy

## 2018-08-15 DIAGNOSIS — M25511 Pain in right shoulder: Secondary | ICD-10-CM

## 2018-08-15 DIAGNOSIS — M25611 Stiffness of right shoulder, not elsewhere classified: Secondary | ICD-10-CM | POA: Diagnosis not present

## 2018-08-15 DIAGNOSIS — M6281 Muscle weakness (generalized): Secondary | ICD-10-CM

## 2018-08-15 NOTE — Therapy (Signed)
[facility] [address], [address] [place], [place], [address] Phone: [phone]   Fax:  [phone]  Physical Therapy Treatment  Patient Details  Name: Douglas Hendricks MRN: [id number] Date of Birth: 06/27/1956 Referring Provider (PT): Norlene CampbellWhitfield, Peter, MD   Encounter Date: 08/15/2018  PT End of Session - 08/15/18 1101    Visit Number  15    Date for PT Re-Evaluation  08/27/18    Authorization - Visit Number  15    Authorization - Number of Visits  24    PT Start Time  1016    PT Stop Time  1111    PT Time Calculation (min)  55 min    Activity Tolerance  Patient tolerated treatment well;No increased pain    Behavior During Therapy  WFL for tasks assessed/performed       Past Medical History:  Diagnosis Date  . Arthritis   . Hernia, inguinal, left 2017  . Hyperlipidemia   . Thoracic stomach hernia 2015    Past Surgical History:  Procedure Laterality Date  . ABDOMINAL HERNIA REPAIR    . hip pain Bilateral   . HIP SURGERY Right   . INGUINAL HERNIA REPAIR Left   . INNER EAR SURGERY Right   . KNEE SURGERY Left   . SHOULDER SURGERY    . TOTAL HIP ARTHROPLASTY     right  . TYMPANOSTOMY Right     There were no vitals filed for this visit.  Subjective Assessment - 08/15/18 1018    Subjective  Pt states that things are going well. His sleeping is significantly better unless he sleeps on the Rt shoulder which causes an ache underneath his shoulder blade.    Currently in Pain?  Yes    Pain Score  1     Pain Location  Shoulder    Pain Orientation  Right;Lateral    Pain Descriptors / Indicators  Aching;Sore    Pain Type  Surgical pain    Pain Radiating Towards  none     Pain Onset  More than a month ago    Pain Frequency  Constant    Aggravating Factors   just therex     Pain Relieving Factors  rest, pain medication                       [facility] Adult PT Treatment/Exercise - 08/15/18 0001      Shoulder Exercises: Prone   Other Prone Exercises  RUE liftoff and reach over head x10 reps       Shoulder Exercises: Sidelying   ABduction  Strengthening;Right;20 reps    ABduction Weight (lbs)  2    ABduction Limitations  pt able to complete without pain       Shoulder Exercises: Standing   Other Standing Exercises  RUE ball on wall CW and CCW x1 min each       Shoulder Exercises: ROM/Strengthening   Other ROM/Strengthening Exercises  RUE body blade in 90 deg flexion and abduction up and down/Lt and Rt 2x30 sec each       Vasopneumatic   Number Minutes Vasopneumatic   15 minutes    Vasopnuematic Location   Shoulder    Vasopneumatic Pressure  Medium    Vasopneumatic Temperature   3 snowflakes      Manual Therapy   Manual Therapy  Soft tissue mobilization;Myofascial release    Soft tissue mobilization  STM Rt posterior/middle deltoid  Myofascial Release  trigger point release Rt latissimus during passive shoulder flexion/abduction stretch       Neck Exercises: Stretches   Other Neck Stretches  B lat stretch seated with end range thoracic extension             PT Education - 08/15/18 1101    Education Details  technique with therex    Person(s) Educated  Patient    Methods  Explanation;Verbal cues;Tactile cues    Comprehension  Verbalized understanding;Returned demonstration       PT Short Term Goals - 08/15/18 1200      PT SHORT TERM GOAL #1   Title  be independent in initial HEP    Status  Achieved      PT SHORT TERM GOAL #2   Title  demonstrate Rt shoulder P/ROM flexion to > or = to 130 degrees to improve mobility    Baseline  170    Time  4    Period  Weeks    Status  Achieved      PT SHORT TERM GOAL #3   Title  demonstrate Rt shoulder P/ROM ER to > or = to 45 degrees to improve mobility    Baseline  45 deg    Status  Achieved      PT SHORT TERM GOAL #4   Title  demonstrate Rt shoulder A/AROM flexion to > or = to 100 degrees without scapular  elevation to restore normal mechanics    Baseline  160 deg    Status  Achieved        PT Long Term Goals - 08/04/18 1025      PT LONG TERM GOAL #1   Title  be independent in advanced HEP    Time  8    Period  Weeks    Status  On-going      PT LONG TERM GOAL #2   Title  reduce FOTO to < or = to 35% limitation    Baseline  40% limitaiton    Time  8    Period  Weeks    Status  On-going      PT LONG TERM GOAL #3   Title  demonstrate Rt shoulder A/ROM flexion to > or = to 140 degrees to allow for overhead reaching     Baseline  150    Status  Achieved      PT LONG TERM GOAL #4   Title  demonstrate Rt IR A/ROM to > or = to L1 to improve self-care    Baseline  L1    Status  Achieved      PT LONG TERM GOAL #5   Title  demonstrate > or = to 4/5 Rt shoulder strength to improve endurance for use with functional tasks    Baseline  4-/5 to 4/5    Time  8    Period  Weeks    Status  Achieved      PT LONG TERM GOAL #6   Title  report > or = to 60% use of Rt UE without limitation due to strength, ROM or pain    Status  Achieved            Plan - 08/15/18 1157    Clinical Impression Statement  Pt is doing well, reporting minimal discomfort of the RUE throughout the day and pain free sleep unless prolonged time is spent on the Rt side. Session focused initially with manual treatment and myofascial release  to the Rt deltoid and latissimus/teres muscle group. Otherwise shoulder strength and endurance were progressed within pain free limits. Pt's shoulder did become fatigued with ball on wall exercise but he was able to do this with minimal indications of shoulder shrug. Ended with game ready to decrease post session soreness and pt had no concerns/complaints end of session.     Rehab Potential  Excellent    PT Frequency  2x / week    PT Duration  8 weeks    PT Treatment/Interventions  ADLs/Self Care Home Management;Cryotherapy;Electrical Stimulation;Moist Heat;Therapeutic  activities;Therapeutic exercise;Patient/family education;Neuromuscular re-education;Manual techniques;Passive range of motion;Vasopneumatic Device;Taping    PT Next Visit Plan  Rt shoulder strength and flexibility, focus on endurance and overhead use     PT Home Exercise Plan  Access Code: [email or web address]     Consulted and Agree with Plan of Care  Patient       Patient will benefit from skilled therapeutic intervention in order to improve the following deficits and impairments:  Pain, Improper body mechanics, Impaired flexibility, Decreased activity tolerance, Decreased range of motion, Decreased strength, Impaired UE functional use, Postural dysfunction, Decreased scar mobility  Visit Diagnosis: Acute pain of right shoulder  Stiffness of right shoulder, not elsewhere classified  Muscle weakness (generalized)     Problem List Patient Active Problem List   Diagnosis Date Noted  . Ventral hernia without obstruction or gangrene 03/18/2014    12:07 PM,08/15/18 Donita Brooks PT, DPT [facility]  [phone]  [facility] [address], [address] [place], [place], [address] Phone: [phone]   Fax:  [phone]  Name: Douglas Hendricks MRN: [id number] Date of Birth: Mar 15, 1956

## 2018-08-15 NOTE — Patient Instructions (Signed)
Access Code: [email or web address]  URL: [email or web address]  Date: 08/15/2018  Prepared by: Donita BrooksSara Jaliya Siegmann   Exercises  Supine Shoulder Flexion Extension AAROM with Dowel - 10 reps - 1-2 sets - 2x daily - 6x weekly  Supine Shoulder Abduction AAROM with Dowel - 10 reps - 1-2 sets - 2x daily - 6x weekly  Supine Shoulder External Rotation in 45 Degrees Abduction AAROM with Dowel - 10 reps - 1-2 sets - 2x daily - 6x weekly  Standing Row with Anchored Resistance - 20-30 reps - 2 sets - 2x daily - 7x weekly  Standing Isometric Shoulder External Rotation with Doorway and Towel Roll - 10 reps - 5-10 hold - 2x daily - 7x weekly  Shoulder Extension with Resistance - Palms Forward - 10 reps - 2 sets - 2x daily - 7x weekly  Scapular Protraction at Wall - 20 reps - 2x daily - 7x weekly  Prone Shoulder Extension - 10 reps - 3 sets - 2x daily - 7x weekly  Prone Shoulder Row - 10 reps - 2 sets - 2x daily - 7x weekly  Supine Alternating Shoulder Flexion - 20 reps - 2 sets - 1x daily - 7x weekly  Shoulder Internal Rotation with Resistance - 20 reps - 1 sets - 2x daily - 7x weekly  Sidelying Shoulder Abduction Full Range of Motion - 20 reps - 2 sets - 1x daily - 7x weekly  Sidelying Shoulder External Rotation - 10 reps - 2 sets - 1x daily - 7x weekly  Seated Shoulder Abduction - Palms Down - 10 reps - 2 sets - 2x daily - 7x weekly  Seated Shoulder Flexion - 10 reps - 2 sets - 2x daily - 7x weekly  Seated Shoulder Scaption - 10 reps - 2 sets - 2x daily - 7x weekly  Prone Shoulder Flexion - 10 reps - 2 sets - 1x daily - 7x weekly  Prone Shoulder Extension - Single Arm - 10 reps - 2 sets - 1x daily - 7x weekly  Prone Chest Stretch on Chair - 10 reps - 5 hold - 1x daily - 7x weekly    [facility] [address], [address] [place], [place] [address] Phone # [phone] Fax [phone]

## 2018-08-18 ENCOUNTER — Ambulatory Visit: Payer: Commercial Managed Care - PPO

## 2018-08-18 DIAGNOSIS — M25611 Stiffness of right shoulder, not elsewhere classified: Secondary | ICD-10-CM

## 2018-08-18 DIAGNOSIS — M25511 Pain in right shoulder: Secondary | ICD-10-CM

## 2018-08-18 DIAGNOSIS — M6281 Muscle weakness (generalized): Secondary | ICD-10-CM

## 2018-08-18 NOTE — Therapy (Signed)
[facility] [address], [address] [place], [place], [address] Phone: [phone]   Fax:  [phone]  Physical Therapy Treatment  Patient Details  Name: Douglas Hendricks MRN: [id number] Date of Birth: December 02, 1955 Referring Provider (PT): Douglas CampbellWhitfield, Peter, MD   Encounter Date: 08/18/2018  PT End of Session - 08/18/18 1051    Visit Number  16    Date for PT Re-Evaluation  08/27/18    Authorization Type  24 approved through 08/26/18    Authorization - Visit Number  16    Authorization - Number of Visits  24    PT Start Time  1014    PT Stop Time  1106    PT Time Calculation (min)  52 min    Activity Tolerance  Patient tolerated treatment well;No increased pain    Behavior During Therapy  WFL for tasks assessed/performed       Past Medical History:  Diagnosis Date  . Arthritis   . Hernia, inguinal, left 2017  . Hyperlipidemia   . Thoracic stomach hernia 2015    Past Surgical History:  Procedure Laterality Date  . ABDOMINAL HERNIA REPAIR    . hip pain Bilateral   . HIP SURGERY Right   . INGUINAL HERNIA REPAIR Left   . INNER EAR SURGERY Right   . KNEE SURGERY Left   . SHOULDER SURGERY    . TOTAL HIP ARTHROPLASTY     right  . TYMPANOSTOMY Right     There were no vitals filed for this visit.  Subjective Assessment - 08/18/18 1017    Subjective  Shoulder is doing great.      Currently in Pain?  Yes    Pain Score  1     Pain Location  Shoulder    Pain Orientation  Right    Pain Descriptors / Indicators  Aching;Sore    Pain Type  Surgical pain    Pain Onset  More than a month ago    Aggravating Factors   repetative use    Pain Relieving Factors  rest, pain                       [facility] Adult PT Treatment/Exercise - 08/18/18 0001      Neck Exercises: Machines for Strengthening   UBE (Upper Arm Bike)  Level 10 x 8 minutes (4/4)   PT present to discuss progress     Shoulder Exercises: Seated   Other Seated Exercises  3 way raises : 2# 2x10 on Rt      Shoulder Exercises: Prone   Retraction  Strengthening;Right;20 reps;Weights    Retraction Weight (lbs)  5    Flexion  Strengthening;Right;20 reps    Flexion Weight (lbs)  2    Extension  Strengthening;Right;20 reps;Weights    Extension Weight (lbs)  5      Shoulder Exercises: Standing   Flexion  Right;10 reps   using finger ladder   Shoulder Flexion Weight (lbs)  2    Other Standing Exercises  RUE ball on wall CW and CCW x1 min each       Shoulder Exercises: Pulleys   Other Pulley Exercises  IR x 2 minutes      Shoulder Exercises: ROM/Strengthening   Other ROM/Strengthening Exercises  RUE body blade in 90 deg flexion and abduction up and down/Lt and Rt 2x30 sec each       Vasopneumatic   Number Minutes Vasopneumatic   15 minutes  Vasopnuematic Location   Shoulder    Vasopneumatic Pressure  Medium    Vasopneumatic Temperature   3 snowflakes               PT Short Term Goals - 08/15/18 1200      PT SHORT TERM GOAL #1   Title  be independent in initial HEP    Status  Achieved      PT SHORT TERM GOAL #2   Title  demonstrate Rt shoulder P/ROM flexion to > or = to 130 degrees to improve mobility    Baseline  170    Time  4    Period  Weeks    Status  Achieved      PT SHORT TERM GOAL #3   Title  demonstrate Rt shoulder P/ROM ER to > or = to 45 degrees to improve mobility    Baseline  45 deg    Status  Achieved      PT SHORT TERM GOAL #4   Title  demonstrate Rt shoulder A/AROM flexion to > or = to 100 degrees without scapular elevation to restore normal mechanics    Baseline  160 deg    Status  Achieved        PT Long Term Goals - 08/04/18 1025      PT LONG TERM GOAL #1   Title  be independent in advanced HEP    Time  8    Period  Weeks    Status  On-going      PT LONG TERM GOAL #2   Title  reduce FOTO to < or = to 35% limitation    Baseline  40% limitaiton    Time  8    Period  Weeks     Status  On-going      PT LONG TERM GOAL #3   Title  demonstrate Rt shoulder A/ROM flexion to > or = to 140 degrees to allow for overhead reaching     Baseline  150    Status  Achieved      PT LONG TERM GOAL #4   Title  demonstrate Rt IR A/ROM to > or = to L1 to improve self-care    Baseline  L1    Status  Achieved      PT LONG TERM GOAL #5   Title  demonstrate > or = to 4/5 Rt shoulder strength to improve endurance for use with functional tasks    Baseline  4-/5 to 4/5    Time  8    Period  Weeks    Status  Achieved      PT LONG TERM GOAL #6   Title  report > or = to 60% use of Rt UE without limitation due to strength, ROM or pain    Status  Achieved            Plan - 08/18/18 1029    Clinical Impression Statement  Pt reports 90% overall use of Rt UE with ADLs and is limited with heavy lifting.  Pt demonstrates improved use of Rt UE against gravity without scapular elevation with 2# weight.  Pt requires intermittent and very minor cueing for technique.  Pt will continue PT until the end of December and D/C to HEP.      Rehab Potential  Excellent    PT Frequency  2x / week    PT Duration  8 weeks    PT Treatment/Interventions  ADLs/Self Care Home Management;Cryotherapy;Lobbyistlectrical  Stimulation;Moist Heat;Therapeutic activities;Therapeutic exercise;Patient/family education;Neuromuscular re-education;Manual techniques;Passive range of motion;Vasopneumatic Device;Taping    PT Next Visit Plan  Rt shoulder strength and flexibility, focus on endurance and overhead use     PT Home Exercise Plan  Access Code: [email or web address]     Consulted and Agree with Plan of Care  Patient       Patient will benefit from skilled therapeutic intervention in order to improve the following deficits and impairments:  Pain, Improper body mechanics, Impaired flexibility, Decreased activity tolerance, Decreased range of motion, Decreased strength, Impaired UE functional use, Postural dysfunction, Decreased scar  mobility  Visit Diagnosis: Acute pain of right shoulder  Stiffness of right shoulder, not elsewhere classified  Muscle weakness (generalized)     Problem List Patient Active Problem List   Diagnosis Date Noted  . Ventral hernia without obstruction or gangrene 03/18/2014    Lorrene Reid, PT 08/18/18 10:52 AM  [facility] [address] [place], [place], [address] Phone: [phone]   Fax:  [phone]  Name: Douglas Hendricks MRN: [id number] Date of Birth: [birth year]

## 2018-08-21 ENCOUNTER — Encounter: Payer: Self-pay | Admitting: Physical Therapy

## 2018-08-21 ENCOUNTER — Ambulatory Visit: Payer: Commercial Managed Care - PPO | Admitting: Physical Therapy

## 2018-08-21 DIAGNOSIS — M25511 Pain in right shoulder: Secondary | ICD-10-CM

## 2018-08-21 DIAGNOSIS — M25611 Stiffness of right shoulder, not elsewhere classified: Secondary | ICD-10-CM | POA: Diagnosis not present

## 2018-08-21 DIAGNOSIS — M6281 Muscle weakness (generalized): Secondary | ICD-10-CM

## 2018-08-21 NOTE — Therapy (Signed)
[facility] [address], [address] [place], [place], [address] Phone: [phone]   Fax:  [phone]  Physical Therapy Treatment  Patient Details  Name: Douglas Hendricks MRN: [id number] Date of Birth: 1956/01/22 Referring Provider (PT): Norlene CampbellWhitfield, Peter, MD   Encounter Date: 08/21/2018  PT End of Session - 08/21/18 1056    Visit Number  17    Date for PT Re-Evaluation  08/27/18    Authorization Type  24 approved through 08/26/18    Authorization - Visit Number  17    Authorization - Number of Visits  24    PT Start Time  1015    PT Stop Time  1108    PT Time Calculation (min)  53 min    Activity Tolerance  Patient tolerated treatment well;No increased pain    Behavior During Therapy  WFL for tasks assessed/performed       Past Medical History:  Diagnosis Date  . Arthritis   . Hernia, inguinal, left 2017  . Hyperlipidemia   . Thoracic stomach hernia 2015    Past Surgical History:  Procedure Laterality Date  . ABDOMINAL HERNIA REPAIR    . hip pain Bilateral   . HIP SURGERY Right   . INGUINAL HERNIA REPAIR Left   . INNER EAR SURGERY Right   . KNEE SURGERY Left   . SHOULDER SURGERY    . TOTAL HIP ARTHROPLASTY     right  . TYMPANOSTOMY Right     There were no vitals filed for this visit.  Subjective Assessment - 08/21/18 1018    Subjective  Pt states shoulder is doing great. No complaints at this time.    Currently in Pain?  Yes    Pain Score  1     Pain Location  Shoulder    Pain Onset  More than a month ago                       [facility] Adult [facility] Treatment/Exercise - 08/21/18 0001      Neck Exercises: Machines for Strengthening   UBE (Upper Arm Bike)  L1 x2 min forward/backward; PT present to discuss progress      Shoulder Exercises: Prone   Extension  Right;10 reps    Extension Limitations  palm down     Horizontal ABduction 1  Right;Strengthening;10 reps    Horizontal ABduction 1  Limitations  1 set thumb up, 1 set thumb in neutral     Other Prone Exercises  RUE Y x10 reps thumb up and x10 reps thumb in neutral     Other Prone Exercises  RUE row with ER at 90 deg abduction x10 reps       Shoulder Exercises: Standing   Internal Rotation  Right;Strengthening;20 reps;Theraband    Theraband Level (Shoulder Internal Rotation)  Level 3 (Green)    Other Standing Exercises  RUE ER with 90 deg press out using red TB x15 reps; 90 deg RUE retraction and ER x15 reps using yellow TB; 90 deg RUE ER with press overhead x8 reps using yellow TB (increased difficulty with this)      Shoulder Exercises: Pulleys   Other Pulley Exercises  Rt shoulder IR x2 min       Shoulder Exercises: ROM/Strengthening   Plank  3 reps    Plank Limitations  inlcined surface with LUE reach 3 way    Rhythmic Stabilization, Seated  bow and arrow with blue TB x10 reps  each     Other ROM/Strengthening Exercises  Lt and Rt shoulder 3 way reach with yellow TB around wrist x5 reps       Vasopneumatic   Number Minutes Vasopneumatic   15 minutes    Vasopnuematic Location   Shoulder    Vasopneumatic Pressure  Medium    Vasopneumatic Temperature   3 snowflakes             PT Education - 08/21/18 1053    Education Details  technique with therex    Person(s) Educated  Patient    Methods  Explanation;Verbal cues;Tactile cues    Comprehension  Verbalized understanding;Returned demonstration       PT Short Term Goals - 08/15/18 1200      PT SHORT TERM GOAL #1   Title  be independent in initial HEP    Status  Achieved      PT SHORT TERM GOAL #2   Title  demonstrate Rt shoulder P/ROM flexion to > or = to 130 degrees to improve mobility    Baseline  170    Time  4    Period  Weeks    Status  Achieved      PT SHORT TERM GOAL #3   Title  demonstrate Rt shoulder P/ROM ER to > or = to 45 degrees to improve mobility    Baseline  45 deg    Status  Achieved      PT SHORT TERM GOAL #4   Title   demonstrate Rt shoulder A/AROM flexion to > or = to 100 degrees without scapular elevation to restore normal mechanics    Baseline  160 deg    Status  Achieved        PT Long Term Goals - 08/04/18 1025      PT LONG TERM GOAL #1   Title  be independent in advanced HEP    Time  8    Period  Weeks    Status  On-going      PT LONG TERM GOAL #2   Title  reduce FOTO to < or = to 35% limitation    Baseline  40% limitaiton    Time  8    Period  Weeks    Status  On-going      PT LONG TERM GOAL #3   Title  demonstrate Rt shoulder A/ROM flexion to > or = to 140 degrees to allow for overhead reaching     Baseline  150    Status  Achieved      PT LONG TERM GOAL #4   Title  demonstrate Rt IR A/ROM to > or = to L1 to improve self-care    Baseline  L1    Status  Achieved      PT LONG TERM GOAL #5   Title  demonstrate > or = to 4/5 Rt shoulder strength to improve endurance for use with functional tasks    Baseline  4-/5 to 4/5    Time  8    Period  Weeks    Status  Achieved      PT LONG TERM GOAL #6   Title  report > or = to 60% use of Rt UE without limitation due to strength, ROM or pain    Status  Achieved            Plan - 08/21/18 1056    Clinical Impression Statement  Continued this session with therex to promote shoulder strength,  endurance and neuromuscular control. Shoulder rotation strength was challenged in higher degrees of shoulder abduction. Pt was able to activate middle and lower trap during all of today's exercises, but he required verbal/tactile cues initially. Proper muscle activation became noticeably difficult toward the end of the session as shoulder fatigue and muscle shaking set in. Pt reported no increase in pain end of session and is ready for any necessary HEP updates and d/c at his next visit.     Rehab Potential  Excellent    PT Frequency  2x / week    PT Duration  8 weeks    PT Treatment/Interventions  ADLs/Self Care Home  Management;Cryotherapy;Electrical Stimulation;Moist Heat;Therapeutic activities;Therapeutic exercise;Patient/family education;Neuromuscular re-education;Manual techniques;Passive range of motion;Vasopneumatic Device;Taping    PT Next Visit Plan  update HEP if needed; middle/low trap strengthening; d/c from PT    PT Home Exercise Plan  Access Code: [email or web address]     Consulted and Agree with Plan of Care  Patient       Patient will benefit from skilled therapeutic intervention in order to improve the following deficits and impairments:  Pain, Improper body mechanics, Impaired flexibility, Decreased activity tolerance, Decreased range of motion, Decreased strength, Impaired UE functional use, Postural dysfunction, Decreased scar mobility  Visit Diagnosis: Acute pain of right shoulder  Stiffness of right shoulder, not elsewhere classified  Muscle weakness (generalized)     Problem List Patient Active Problem List   Diagnosis Date Noted  . Ventral hernia without obstruction or gangrene 03/18/2014    11:08 AM,08/21/18 Donita Brooks PT, DPT [facility]  [phone]  [facility] [address], [address] [place], [place], [address] Phone: [phone]   Fax:  [phone]  Name: Douglas Hendricks MRN: [id number] Date of Birth: 1956-08-16

## 2018-08-25 ENCOUNTER — Ambulatory Visit: Payer: Commercial Managed Care - PPO

## 2018-08-25 ENCOUNTER — Ambulatory Visit (INDEPENDENT_AMBULATORY_CARE_PROVIDER_SITE_OTHER): Payer: Commercial Managed Care - PPO | Admitting: Orthopaedic Surgery

## 2018-08-25 ENCOUNTER — Encounter (INDEPENDENT_AMBULATORY_CARE_PROVIDER_SITE_OTHER): Payer: Self-pay | Admitting: Orthopaedic Surgery

## 2018-08-25 VITALS — BP 119/79 | HR 77 | Ht 71.0 in | Wt 207.0 lb

## 2018-08-25 DIAGNOSIS — M6281 Muscle weakness (generalized): Secondary | ICD-10-CM

## 2018-08-25 DIAGNOSIS — M25611 Stiffness of right shoulder, not elsewhere classified: Secondary | ICD-10-CM | POA: Diagnosis not present

## 2018-08-25 DIAGNOSIS — G8929 Other chronic pain: Secondary | ICD-10-CM | POA: Insufficient documentation

## 2018-08-25 DIAGNOSIS — M25511 Pain in right shoulder: Secondary | ICD-10-CM

## 2018-08-25 NOTE — Progress Notes (Signed)
Office Visit Note   Patient: Douglas Hendricks           Date of Birth: 1956/02/27           MRN: [id number] Visit Date: 08/25/2018              Requested by: Daisy Florooss, Charles Alan, MD [address] [place], [place] [address] PCP: Daisy Florooss, Charles Alan, MD   Assessment & Plan: Visit Diagnoses:  1. Chronic right shoulder pain     Plan: 9 to 10-week status post rotator cuff tear repair right shoulder.  Progressing very nicely in therapy.  Has regained full overhead motion and no pain.  Can sleep on that side.  Have discussed activity modification.I think is fine to swim with gentle overhead stroke.  We will plan to see back in the next month or 6 weeks if he is having any problem otherwise as needed  Follow-Up Instructions: Return if symptoms worsen or fail to improve.   Orders:  No orders of the defined types were placed in this encounter.  No orders of the defined types were placed in this encounter.     Procedures: No procedures performed   Clinical Data: No additional findings.   Subjective: No chief complaint on file. 9 to 10 weeks status post rotator cuff tear repair of right shoulder.  Has been released from physical therapy with excellent progress.  No trouble sleeping on his right shoulder.  Did some plumbing work this weekend without any problems.  Has home exercise program.  Long discussion regarding limitations and what he can expect in the short and long-term  HPI  Review of Systems   Objective: Vital Signs: BP 119/79   Pulse 77   Ht 5\' 11"  (1.803 m)   Wt 207 lb (93.9 kg)   BMI 28.87 kg/m   Physical Exam  Ortho Exam right shoulder with full quick overhead motion and abduction without any hesitation.  Negative impingement empty can testing.  Good strength.  Neurovascular exam intact  Specialty Comments:  No specialty comments available.  Imaging: No results found.   PMFS History: Patient Active Problem List   Diagnosis Date Noted  . Chronic right  shoulder pain 08/25/2018  . Ventral hernia without obstruction or gangrene 03/18/2014   Past Medical History:  Diagnosis Date  . Arthritis   . Hernia, inguinal, left 2017  . Hyperlipidemia   . Thoracic stomach hernia 2015    Family History  Problem Relation Age of Onset  . Cancer Mother        pancreatic  . COPD Father   . Cancer Father        esophageal    Past Surgical History:  Procedure Laterality Date  . ABDOMINAL HERNIA REPAIR    . hip pain Bilateral   . HIP SURGERY Right   . INGUINAL HERNIA REPAIR Left   . INNER EAR SURGERY Right   . KNEE SURGERY Left   . SHOULDER SURGERY    . TOTAL HIP ARTHROPLASTY     right  . TYMPANOSTOMY Right    Social History   Occupational History  . Not on file  Tobacco Use  . Smoking status: Never Smoker  . Smokeless tobacco: Never Used  Substance and Sexual Activity  . Alcohol use: Yes    Comment: 1-2  . Drug use: No  . Sexual activity: Not on file     Valeria BatmanPeter W Marylou Wages, MD   Note - This record has been created using  Editor, commissioning.  Chart creation errors have been sought, but may not always  have been located. Such creation errors do not reflect on  the standard of medical care.

## 2018-08-25 NOTE — Therapy (Signed)
[facility] [address] [place], [place], [address] Phone: [phone]   Fax:  [phone]  Physical Therapy Treatment  Patient Details  Name: Douglas Hendricks MRN: [id number] Date of Birth: 1955/10/17 Referring Provider (PT): Joni Fears, MD   Encounter Date: 08/25/2018  PT End of Session - 08/25/18 1052    Visit Number  18    PT Start Time  8182    PT Stop Time  9937    PT Time Calculation (min)  47 min    Activity Tolerance  Patient tolerated treatment well;No increased pain    Behavior During Therapy  WFL for tasks assessed/performed       Past Medical History:  Diagnosis Date  . Arthritis   . Hernia, inguinal, left 2017  . Hyperlipidemia   . Thoracic stomach hernia 2015    Past Surgical History:  Procedure Laterality Date  . ABDOMINAL HERNIA REPAIR    . hip pain Bilateral   . HIP SURGERY Right   . INGUINAL HERNIA REPAIR Left   . INNER EAR SURGERY Right   . KNEE SURGERY Left   . SHOULDER SURGERY    . TOTAL HIP ARTHROPLASTY     right  . TYMPANOSTOMY Right     There were no vitals filed for this visit.  Subjective Assessment - 08/25/18 1013    Subjective  Ready for D/C.  I had to do some plumbing work over the weekend and my Rt shoulder didn't restrict me at all.      Pertinent History  Rt RTC repair 06/19/18    Currently in Pain?  Yes    Pain Score  0-No pain    Pain Location  Shoulder    Pain Orientation  Right    Pain Descriptors / Indicators  Aching;Sore    Pain Type  Surgical pain    Pain Onset  More than a month ago    Pain Frequency  Constant         [facility] PT Assessment - 08/25/18 0001      Assessment   Medical Diagnosis  chronic Rt shoulder pain, s/p Rt RTC repair    Referring Provider (PT)  Joni Fears, MD      Observation/Other Assessments   Focus on Therapeutic Outcomes (FOTO)   25% limitation      ROM / Strength   AROM / PROM / Strength  AROM;Strength;PROM       AROM   AROM Assessment Site  Shoulder    Right/Left Shoulder  Right    Right Shoulder Flexion  154 Degrees    Right Shoulder ABduction  160 Degrees    Right Shoulder Internal Rotation  --   to T8   Right Shoulder External Rotation  --   to T2     Strength   Overall Strength  Deficits    Strength Assessment Site  Shoulder    Right/Left Shoulder  Right    Right Shoulder Flexion  4/5    Right Shoulder ABduction  4/5    Right Shoulder Internal Rotation  4+/5    Right Shoulder External Rotation  4/5                   [facility] Adult PT Treatment/Exercise - 08/25/18 0001      Neck Exercises: Machines for Strengthening   UBE (Upper Arm Bike)  Level 12x 8 minutes (4/4)   PT present to discuss progress     Shoulder Exercises:  Seated   Other Seated Exercises  3 way raises : 5# flexion and scaption, 3# abduction 2x10 on Rt      Shoulder Exercises: Standing   Other Standing Exercises  wall push-ups on ball 2x10      Shoulder Exercises: Pulleys   Flexion  2 minutes    ABduction  2 minutes    Other Pulley Exercises  Rt shoulder IR x2 min       Vasopneumatic   Number Minutes Vasopneumatic   15 minutes    Vasopnuematic Location   Shoulder    Vasopneumatic Pressure  Medium    Vasopneumatic Temperature   3 snowflakes             PT Education - 08/25/18 1051    Education Details  verbal review of all HEP using visuals from [employer]) Educated  Patient    Methods  Explanation;Demonstration    Comprehension  Verbalized understanding;Returned demonstration       PT Short Term Goals - 08/15/18 1200      PT SHORT TERM GOAL #1   Title  be independent in initial HEP    Status  Achieved      PT SHORT TERM GOAL #2   Title  demonstrate Rt shoulder P/ROM flexion to > or = to 130 degrees to improve mobility    Baseline  170    Time  4    Period  Weeks    Status  Achieved      PT SHORT TERM GOAL #3   Title  demonstrate Rt shoulder P/ROM ER to > or = to  45 degrees to improve mobility    Baseline  45 deg    Status  Achieved      PT SHORT TERM GOAL #4   Title  demonstrate Rt shoulder A/AROM flexion to > or = to 100 degrees without scapular elevation to restore normal mechanics    Baseline  160 deg    Status  Achieved        PT Long Term Goals - 08/25/18 1018      PT LONG TERM GOAL #1   Title  be independent in advanced HEP    Status  Achieved      PT LONG TERM GOAL #2   Title  reduce FOTO to < or = to 35% limitation    Baseline  25% limitation    Status  Achieved      PT LONG TERM GOAL #3   Title  demonstrate Rt shoulder A/ROM flexion to > or = to 140 degrees to allow for overhead reaching     Baseline  154    Status  Achieved      PT LONG TERM GOAL #4   Title  demonstrate Rt IR A/ROM to > or = to L1 to improve self-care    Baseline  T8    Status  Achieved      PT LONG TERM GOAL #5   Title  demonstrate > or = to 4/5 Rt shoulder strength to improve endurance for use with functional tasks    Status  Achieved      PT LONG TERM GOAL #6   Title  report > or = to 60% use of Rt UE without limitation due to strength, ROM or pain    Baseline  >75%    Status  Achieved            Plan - 08/25/18 1042  Clinical Impression Statement  Pt has met all goals s/p Rt shoulder surgery.  Pt with full Rt shoulder A/ROM with minor scapular elevation.  Rt shoulder strength is >4/5 and pt is working to improve strength with exercise at home. Pt requires minor verbal and tactile cues for scapular position with exercise.  FOTO is improved to 25% limitation.  Pt will follow-up with MD today and PT will discharge to HEP.      PT Next Visit Plan  D/C PT to HEP    PT Home Exercise Plan  Access Code: [email or web address]     Consulted and Agree with Plan of Care  Patient       Patient will benefit from skilled therapeutic intervention in order to improve the following deficits and impairments:     Visit Diagnosis: Acute pain of right  shoulder  Stiffness of right shoulder, not elsewhere classified  Muscle weakness (generalized)     Problem List Patient Active Problem List   Diagnosis Date Noted  . Ventral hernia without obstruction or gangrene 03/18/2014   PHYSICAL THERAPY DISCHARGE SUMMARY  Visits from Start of Care: 18  Current functional level related to goals / functional outcomes: See above for most current status.     Remaining deficits: Rt shoulder functional strength deficits.  Pt has HEP in place to address remaining deficits.     Education / Equipment: HEP Plan: Patient agrees to discharge.  Patient goals were met. Patient is being discharged due to meeting the stated rehab goals.  ?????         Sigurd Sos, PT 08/25/18 10:56 AM  [facility] [address], [place], [place], [address] Phone: [phone]   Fax:  [phone]  Name: Douglas Hendricks MRN: [id number] Date of Birth: Sep 08, 1955

## 2018-08-25 NOTE — Progress Notes (Signed)
status post rotator cuff tear repair and pt stated still little sore but have good mobility. PT 2x a week and denied taking meds.

## 2018-08-28 ENCOUNTER — Encounter: Payer: Commercial Managed Care - PPO | Admitting: Physical Therapy

## 2018-09-22 DIAGNOSIS — G4733 Obstructive sleep apnea (adult) (pediatric): Secondary | ICD-10-CM | POA: Diagnosis not present

## 2018-09-29 DIAGNOSIS — G4733 Obstructive sleep apnea (adult) (pediatric): Secondary | ICD-10-CM | POA: Diagnosis not present

## 2018-10-08 DIAGNOSIS — H7201 Central perforation of tympanic membrane, right ear: Secondary | ICD-10-CM | POA: Diagnosis not present

## 2018-10-08 DIAGNOSIS — H9011 Conductive hearing loss, unilateral, right ear, with unrestricted hearing on the contralateral side: Secondary | ICD-10-CM | POA: Diagnosis not present

## 2018-10-23 DIAGNOSIS — G4733 Obstructive sleep apnea (adult) (pediatric): Secondary | ICD-10-CM | POA: Diagnosis not present

## 2018-11-27 DIAGNOSIS — G4733 Obstructive sleep apnea (adult) (pediatric): Secondary | ICD-10-CM | POA: Diagnosis not present

## 2018-12-18 IMAGING — CR DG CERVICAL SPINE COMPLETE 4+V
5 series · 5 of 5 positions shown · non-contrast
Comparison: None.

CLINICAL DATA: Neck pain for 3 months which is worsening and
radiates into the left scapula.

EXAM:
CERVICAL SPINE - COMPLETE 4+ VIEW

[w cervical spine lat]
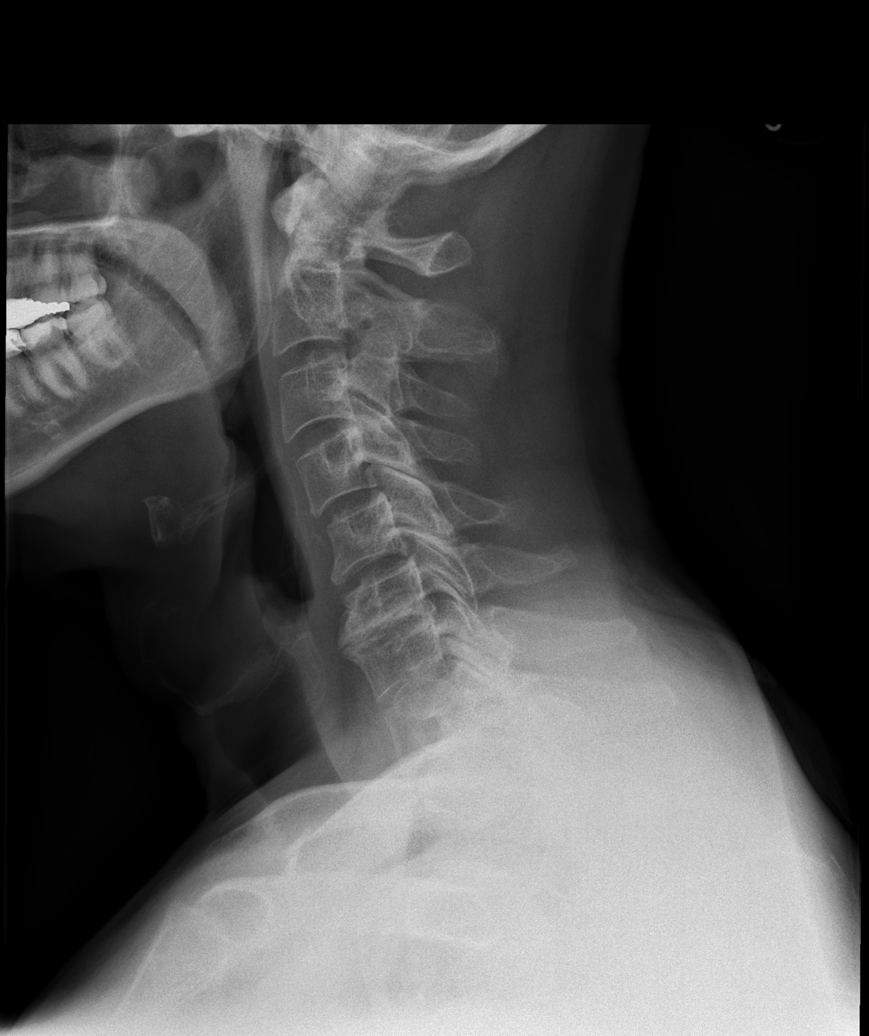

[w cervical spine ap_obl (1 of 2)]
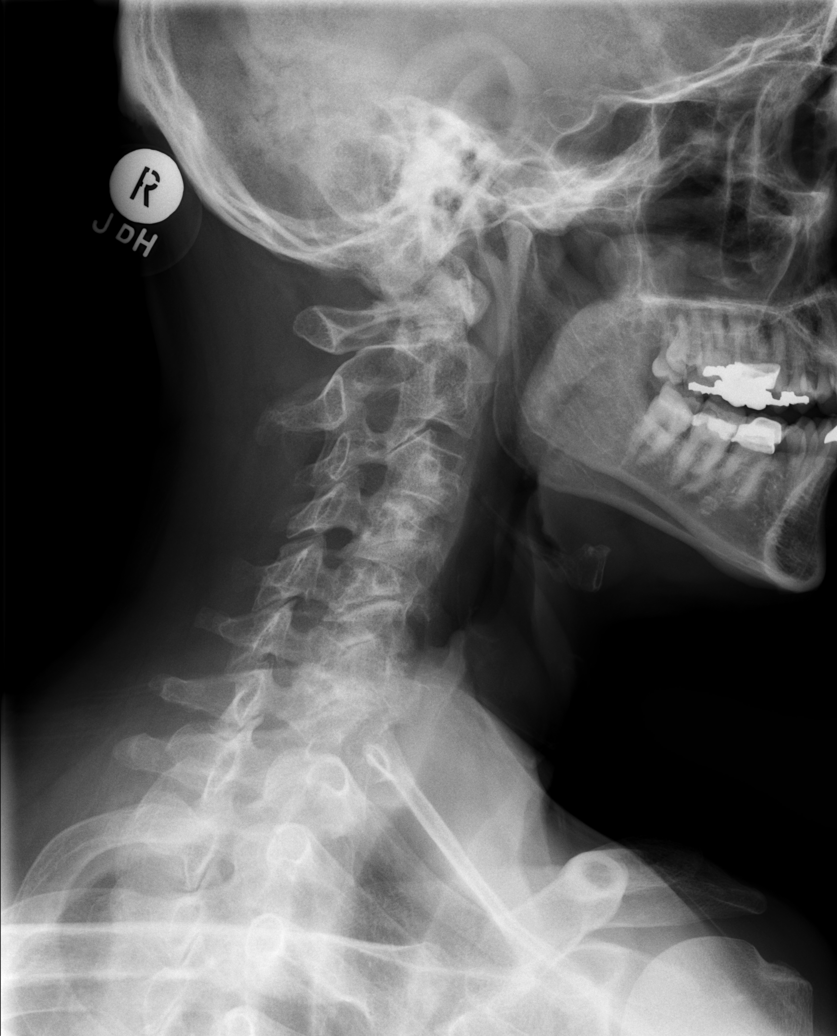

[w cervical spine ap_obl (2 of 2)]
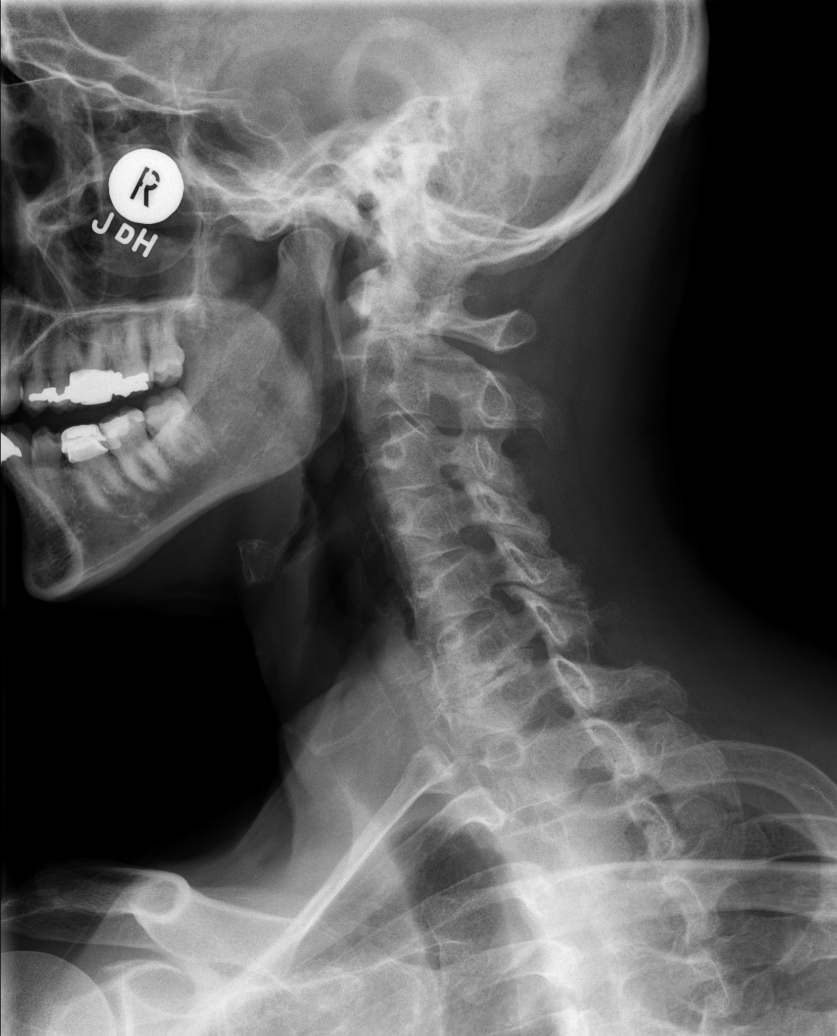

[w cervical spine ap]
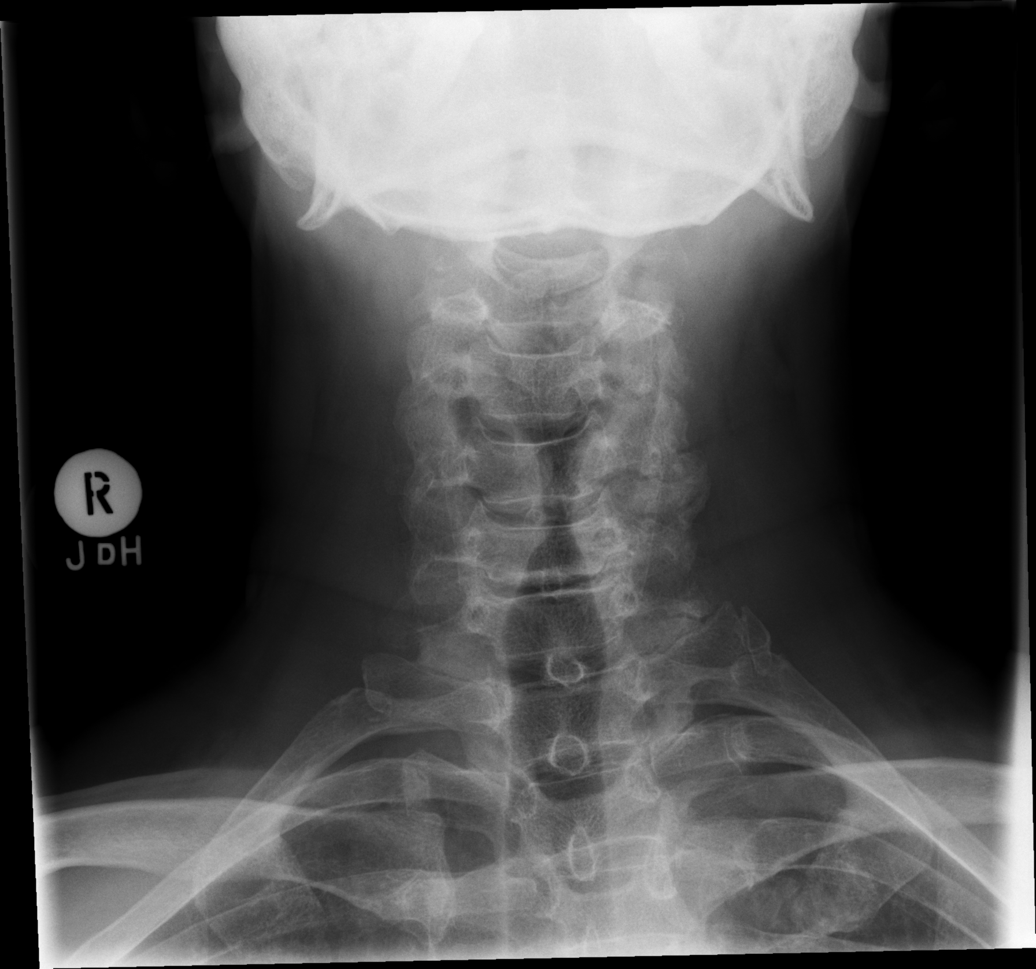

[w cervical spine odontoid]
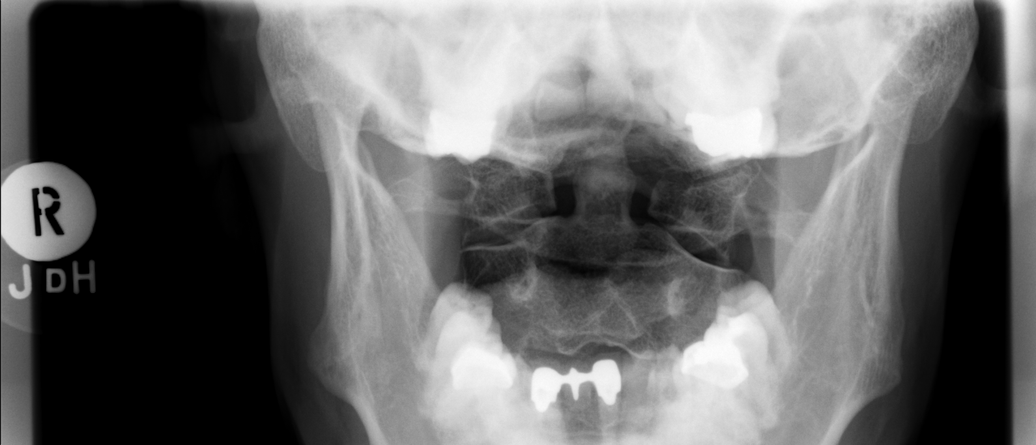

[5 of 5 positions shown; findings below may reference images not displayed]

FINDINGS: No fracture or malalignment. Marked loss of disc space height is
seen at C6-7 with endplate spurring. Prevertebral soft tissues
appear normal. Lung apices are clear.
IMPRESSION: No acute finding.

Advanced appearing degenerative disc disease C6-7.

## 2018-12-22 DIAGNOSIS — G4733 Obstructive sleep apnea (adult) (pediatric): Secondary | ICD-10-CM | POA: Diagnosis not present

## 2019-07-08 ENCOUNTER — Ambulatory Visit: Payer: Commercial Managed Care - PPO | Admitting: Orthopaedic Surgery

## 2019-07-09 ENCOUNTER — Encounter: Payer: Self-pay | Admitting: Orthopaedic Surgery

## 2019-07-09 ENCOUNTER — Ambulatory Visit (INDEPENDENT_AMBULATORY_CARE_PROVIDER_SITE_OTHER): Payer: Self-pay | Admitting: Orthopaedic Surgery

## 2019-07-09 ENCOUNTER — Ambulatory Visit (INDEPENDENT_AMBULATORY_CARE_PROVIDER_SITE_OTHER): Payer: Self-pay

## 2019-07-09 ENCOUNTER — Other Ambulatory Visit: Payer: Self-pay

## 2019-07-09 VITALS — Ht 71.0 in | Wt 195.0 lb

## 2019-07-09 DIAGNOSIS — M25512 Pain in left shoulder: Secondary | ICD-10-CM

## 2019-07-09 DIAGNOSIS — G8929 Other chronic pain: Secondary | ICD-10-CM

## 2019-07-09 DIAGNOSIS — S4352XA Sprain of left acromioclavicular joint, initial encounter: Secondary | ICD-10-CM
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
## 2019-07-27 ENCOUNTER — Telehealth: Payer: Self-pay | Admitting: Orthopaedic Surgery

## 2019-07-27 NOTE — Telephone Encounter (Signed)
Patient would like to have mri of the left shoulder set up. Patient's call back # [phone]

## 2019-07-28 ENCOUNTER — Other Ambulatory Visit: Payer: Self-pay | Admitting: Orthopaedic Surgery

## 2019-07-28 DIAGNOSIS — M25512 Pain in left shoulder: Secondary | ICD-10-CM

## 2019-07-28 NOTE — Telephone Encounter (Signed)
Spoke with patient. He is aware that order has been placed.

## 2019-07-28 NOTE — Telephone Encounter (Signed)
Ok to schedule.

## 2019-07-28 NOTE — Telephone Encounter (Signed)
Please advise 

## 2019-08-05 ENCOUNTER — Encounter: Payer: Self-pay | Admitting: Orthopaedic Surgery

## 2019-08-10 ENCOUNTER — Telehealth: Payer: Self-pay | Admitting: Orthopaedic Surgery

## 2019-08-10 NOTE — Telephone Encounter (Signed)
Patient called wanting to know if his appointment on Wednesday, December 23rd can be a televisit.  CB#[phone].  Thank you.

## 2019-08-12 NOTE — Telephone Encounter (Signed)
Please call patient tomorrow at your convenience with MRI results.

## 2019-08-13 ENCOUNTER — Ambulatory Visit: Payer: Self-pay | Admitting: Orthopaedic Surgery

## 2019-08-13 NOTE — Telephone Encounter (Signed)
Called and discussed the MRI report

## 2019-08-19 ENCOUNTER — Encounter: Payer: Self-pay | Admitting: Orthopaedic Surgery

## 2019-08-19 ENCOUNTER — Ambulatory Visit (INDEPENDENT_AMBULATORY_CARE_PROVIDER_SITE_OTHER): Payer: Commercial Managed Care - PPO | Admitting: Orthopaedic Surgery

## 2019-08-19 ENCOUNTER — Other Ambulatory Visit: Payer: Self-pay

## 2019-08-19 DIAGNOSIS — G8929 Other chronic pain: Secondary | ICD-10-CM

## 2019-08-19 DIAGNOSIS — M25512 Pain in left shoulder: Secondary | ICD-10-CM

## 2019-08-19 MED ORDER — METHYLPREDNISOLONE ACETATE 40 MG/ML IJ SUSP
80.0000 mg | INTRAMUSCULAR | Status: AC | PRN
  Administered 2019-08-19: 80 mg via INTRA_ARTICULAR

## 2019-08-19 MED ORDER — BUPIVACAINE HCL 0.5 % IJ SOLN
2.0000 mL | INTRAMUSCULAR | Status: AC | PRN
  Administered 2019-08-19: 2 mL via INTRA_ARTICULAR

## 2019-08-19 MED ORDER — LIDOCAINE HCL 2 % IJ SOLN
2.0000 mL | INTRAMUSCULAR | Status: AC | PRN
  Administered 2019-08-19: 2 mL

## 2019-08-19 NOTE — Progress Notes (Signed)
Office Visit Note   Patient: Douglas Hendricks           Date of Birth: 04-Sep-1955           MRN: [id number] Visit Date: 08/19/2019              Requested by: Lawerance Cruel, [place],  [place] [id number] PCP: Lawerance Cruel, MD   Assessment & Plan: Visit Diagnoses:  1. Chronic left shoulder pain     Plan: Mr. Fournet had an MRI scan of his left shoulder on November 12.  I discussed this with him by phone and suggested he might want to get a cortisone injection he comes in the office today for that procedure.  The MRI scan revealed moderate supraspinatus and mild subscapularis and infraspinatus tendinopathy.  There was a potential partial-thickness bursal tear of the supraspinatus.  No evidence of a full-thickness tear.  There was a small tear of the posterior superior labrum but the glenohumeral joint appeared to be intact  He has a set of exercises at home for his right shoulder which she will use and will try the cortisone injection have him return in 3 to 4 weeks if no improvement.  Some degenerative changes at the [facility] joint and a type II acromium  Follow-Up Instructions: Return if symptoms worsen or fail to improve.   Orders:  Orders Placed This Encounter  Procedures  . Large Joint Inj: L subacromial bursa   No orders of the defined types were placed in this encounter.     Procedures: Large Joint Inj: L subacromial bursa on 08/19/2019 10:27 AM Indications: pain and diagnostic evaluation Details: 25 G 1.5 in needle, anterolateral approach  Arthrogram: No  Medications: 2 mL lidocaine 2 %; 2 mL bupivacaine 0.5 %; 80 mg methylPREDNISolone acetate 40 MG/ML Consent was given by the patient. Immediately prior to procedure a time out was called to verify the correct patient, procedure, equipment, support staff and site/side marked as required. Patient was prepped and draped in the usual sterile fashion.       Clinical Data: No additional findings.    Subjective: Chief Complaint  Patient presents with  . Left Shoulder - Follow-up    MRI results  Patient presents today for left shoulder follow up. He had an MRI done on 08/05/2019 and was called with the results. He is here today for a cortisone injection.  Pain in left shoulder is mostly with overhead activity such as reaching for objects or putting his arm in the sleep of his shirt or coat  HPI  Review of Systems   Objective: Vital Signs: Ht 5\' 11"  (1.803 m)   Wt 195 lb (88.5 kg)   BMI 27.20 kg/m   Physical Exam  Ortho Exam awake alert and oriented x3.  Comfortable sitting.  Positive impingement with pain along the anterior and lateral subacromial region.  No pain posteriorly no popping or clicking.  No pain at the acromioclavicular joint.  Had some pain with overhead motion in the anterior subacromial region.  Biceps intact.  Skin intact.  Good grip and release.  Good strength.  Neurologically intact.  Specialty Comments:  No specialty comments available.  Imaging: No results found.   PMFS History: Patient Active Problem List   Diagnosis Date Noted  . Pain in left shoulder 08/19/2019  . Acromioclavicular sprain, left, initial encounter 07/09/2019  . Chronic right shoulder pain 08/25/2018  . Ventral hernia without obstruction or gangrene 03/18/2014  Past Medical History:  Diagnosis Date  . Arthritis   . Hernia, inguinal, left 2017  . Hyperlipidemia   . Thoracic stomach hernia 2015    Family History  Problem Relation Age of Onset  . Cancer Mother        pancreatic  . COPD Father   . Cancer Father        esophageal    Past Surgical History:  Procedure Laterality Date  . ABDOMINAL HERNIA REPAIR    . hip pain Bilateral   . HIP SURGERY Right   . INGUINAL HERNIA REPAIR Left   . INNER EAR SURGERY Right   . KNEE SURGERY Left   . SHOULDER SURGERY    . TOTAL HIP ARTHROPLASTY     right  . TYMPANOSTOMY Right    Social History   Occupational History  . Not  on file  Tobacco Use  . Smoking status: Never Smoker  . Smokeless tobacco: Never Used  Substance and Sexual Activity  . Alcohol use: Yes    Comment: 1-2  . Drug use: No  . Sexual activity: Not on file

## 2019-10-26 HISTORY — PX: COLONOSCOPY: SHX174

## 2019-10-29 ENCOUNTER — Encounter: Payer: Self-pay | Admitting: Orthopaedic Surgery

## 2019-10-29 ENCOUNTER — Other Ambulatory Visit: Payer: Self-pay

## 2019-10-29 ENCOUNTER — Ambulatory Visit (INDEPENDENT_AMBULATORY_CARE_PROVIDER_SITE_OTHER): Payer: Commercial Managed Care - PPO | Admitting: Orthopaedic Surgery

## 2019-10-29 VITALS — Ht 71.0 in | Wt 198.0 lb

## 2019-10-29 DIAGNOSIS — M25512 Pain in left shoulder: Secondary | ICD-10-CM | POA: Diagnosis not present

## 2019-10-29 DIAGNOSIS — G8929 Other chronic pain: Secondary | ICD-10-CM

## 2019-10-29 MED ORDER — BUPIVACAINE HCL 0.5 % IJ SOLN
2.0000 mL | INTRAMUSCULAR | Status: AC | PRN
  Administered 2019-10-29: 14:00:00 2 mL via INTRA_ARTICULAR

## 2019-10-29 MED ORDER — METHYLPREDNISOLONE ACETATE 40 MG/ML IJ SUSP
80.0000 mg | INTRAMUSCULAR | Status: AC | PRN
  Administered 2019-10-29: 80 mg via INTRA_ARTICULAR

## 2019-10-29 MED ORDER — LIDOCAINE HCL 2 % IJ SOLN
2.0000 mL | INTRAMUSCULAR | Status: AC | PRN
  Administered 2019-10-29: 2 mL

## 2019-10-29 NOTE — Progress Notes (Signed)
Office Visit Note   Patient: Douglas Hendricks           Date of Birth: 24-Oct-1955           MRN: [id number] Visit Date: 10/29/2019              Requested by: Lawerance Cruel, [place],  [place] [id number] PCP: Lawerance Cruel, MD   Assessment & Plan: Visit Diagnoses:  1. Chronic left shoulder pain     Plan: [name] was last seen in December.  He had an MRI scan of his left shoulder demonstrating a bursal surface partial tear of the supraspinatus.  I injected the subacromial space with cortisone he notes that for 2 weeks he did quite well.  He has had some recurrence of his pain with certain activities i.e. push-ups and certain strokes with swimming.  Pain seems to be fairly localized to the shoulder but on occasion has had some pain referred along the posterior aspect of his cervical spine.  He had full range of motion and no referred discomfort to his shoulder by motion of his neck.  I think his problem is related to the partial tear.  I am going to reinject the subacromial space and have him avoid activities for 2 weeks.  Then he will gradually increase his activities.  If he continues to have pain I think the next step would be an arthroscopic debridement including an SCD DCR and then evaluate the rotator cuff possibly with a mini open approach.  He might be a candidate for a patch I went over this in detail with him  Follow-Up Instructions: Return if symptoms worsen or fail to improve.   Orders:  Orders Placed This Encounter  Procedures  . Large Joint Inj: L subacromial bursa   No orders of the defined types were placed in this encounter.     Procedures: Large Joint Inj: L subacromial bursa on 10/29/2019 1:55 PM Indications: pain and diagnostic evaluation Details: 25 G 1.5 in needle, anterolateral approach  Arthrogram: No  Medications: 2 mL lidocaine 2 %; 2 mL bupivacaine 0.5 %; 80 mg methylPREDNISolone acetate 40 MG/ML Consent was given by the patient.  Immediately prior to procedure a time out was called to verify the correct patient, procedure, equipment, support staff and site/side marked as required. Patient was prepped and draped in the usual sterile fashion.       Clinical Data: No additional findings.   Subjective: Chief Complaint  Patient presents with  . Left Shoulder - Pain  Patient presents today for left shoulder pain. He was here last in December and received a cortisone injection. He said that the injection really helped for two weeks. He said that the pain has come back. If he lays on his back for 5 minutes he has pain in his proximal humerus. He has also noticed that after standing he has pain in his scapula that radiates into his neck. He is taking Ibuprofen only as needed. He wonders if weight lifting over the years has caused him this pain in his shoulder. He is right hand dominant.   HPI  Review of Systems  Constitutional: Negative for fatigue.  HENT: Negative for ear pain.   Eyes: Negative for pain.  Respiratory: Negative for shortness of breath.   Cardiovascular: Negative for leg swelling.  Gastrointestinal: Positive for constipation. Negative for diarrhea.  Endocrine: Negative for cold intolerance and heat intolerance.  Genitourinary: Negative for difficulty urinating.  Musculoskeletal: Negative for joint swelling.  Skin: Negative for rash.  Allergic/Immunologic: Negative for food allergies.  Neurological: Negative for weakness.  Hematological: Does not bruise/bleed easily.  Psychiatric/Behavioral: Positive for sleep disturbance.     Objective: Vital Signs: Ht 5\' 11"  (1.803 m)   Wt 198 lb (89.8 kg)   BMI 27.62 kg/m   Physical Exam Constitutional:      Appearance: He is well-developed.  Eyes:     Pupils: Pupils are equal, round, and reactive to light.  Pulmonary:     Effort: Pulmonary effort is normal.  Skin:    General: Skin is warm and dry.  Neurological:     Mental Status: He is alert and  oriented to person, place, and time.  Psychiatric:        Behavior: Behavior normal.     Ortho Exam awake alert and oriented x3.  Comfortable sitting.  Minimally positive impingement left shoulder particularly in external rotation.  Minimally positive impingement testing.  Good strength.  Good grip and release.  Reflexes symmetrical.  No pain with range of motion of cervical spine.  Full extension and flexion as well as rotation minimal discomfort along the anterior subacromial region  Specialty Comments:  No specialty comments available.  Imaging: No results found.   PMFS History: Patient Active Problem List   Diagnosis Date Noted  . Pain in left shoulder 08/19/2019  . Acromioclavicular sprain, left, initial encounter 07/09/2019  . Chronic right shoulder pain 08/25/2018  . Ventral hernia without obstruction or gangrene 03/18/2014   Past Medical History:  Diagnosis Date  . Arthritis   . Hernia, inguinal, left 2017  . Hyperlipidemia   . Thoracic stomach hernia 2015    Family History  Problem Relation Age of Onset  . Cancer Mother        pancreatic  . COPD Father   . Cancer Father        esophageal    Past Surgical History:  Procedure Laterality Date  . ABDOMINAL HERNIA REPAIR    . hip pain Bilateral   . HIP SURGERY Right   . INGUINAL HERNIA REPAIR Left   . INNER EAR SURGERY Right   . KNEE SURGERY Left   . SHOULDER SURGERY    . TOTAL HIP ARTHROPLASTY     right  . TYMPANOSTOMY Right    Social History   Occupational History  . Not on file  Tobacco Use  . Smoking status: Never Smoker  . Smokeless tobacco: Never Used  Substance and Sexual Activity  . Alcohol use: Yes    Comment: 1-2  . Drug use: No  . Sexual activity: Not on file

## 2019-12-14 ENCOUNTER — Telehealth: Payer: Self-pay | Admitting: Orthopaedic Surgery

## 2019-12-14 NOTE — Telephone Encounter (Signed)
Based on your last note that was nothing mentioned about therapy. Last office visit in early March. Please advise.

## 2019-12-14 NOTE — Telephone Encounter (Signed)
Patient called requesting the referral for PT be sent to [facility] at [facility].  CB#[phone].  Thank you.

## 2019-12-15 ENCOUNTER — Other Ambulatory Visit: Payer: Self-pay

## 2019-12-15 DIAGNOSIS — G8929 Other chronic pain: Secondary | ICD-10-CM

## 2019-12-15 NOTE — Telephone Encounter (Signed)
PT for left shoulder at [facility] and treat

## 2019-12-25 ENCOUNTER — Other Ambulatory Visit: Payer: Self-pay

## 2019-12-25 ENCOUNTER — Ambulatory Visit: Payer: Commercial Managed Care - PPO | Attending: Orthopaedic Surgery | Admitting: Physical Therapy

## 2019-12-25 ENCOUNTER — Encounter: Payer: Self-pay | Admitting: Physical Therapy

## 2019-12-25 DIAGNOSIS — M6281 Muscle weakness (generalized): Secondary | ICD-10-CM | POA: Diagnosis present

## 2019-12-25 DIAGNOSIS — M25512 Pain in left shoulder: Secondary | ICD-10-CM | POA: Diagnosis not present

## 2019-12-25 DIAGNOSIS — M25612 Stiffness of left shoulder, not elsewhere classified: Secondary | ICD-10-CM | POA: Diagnosis present

## 2019-12-25 DIAGNOSIS — G8929 Other chronic pain: Secondary | ICD-10-CM | POA: Diagnosis present
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
## 2019-12-25 NOTE — Patient Instructions (Addendum)
Access Code: [email or web address] URL: [email or web address] Date: 12/25/2019 Prepared by: Lavinia Sharps  Exercises Prone Single Arm Shoulder Extension - 1 x daily - 7 x weekly - 2 sets - 10 reps Prone Single Arm Shoulder Horizontal Abduction with Dumbbell - Palm Down - 1 x daily - 7 x weekly - 2 sets - 10 reps Green band shoulder extension 20x Green band rows 20x

## 2019-12-30 ENCOUNTER — Encounter: Payer: Self-pay | Admitting: Physical Therapy

## 2019-12-30 ENCOUNTER — Ambulatory Visit: Payer: Commercial Managed Care - PPO | Attending: Orthopaedic Surgery | Admitting: Physical Therapy

## 2019-12-30 ENCOUNTER — Other Ambulatory Visit: Payer: Self-pay

## 2019-12-30 DIAGNOSIS — G8929 Other chronic pain: Secondary | ICD-10-CM | POA: Diagnosis present

## 2019-12-30 DIAGNOSIS — M25512 Pain in left shoulder: Secondary | ICD-10-CM | POA: Diagnosis present

## 2019-12-30 DIAGNOSIS — M25612 Stiffness of left shoulder, not elsewhere classified: Secondary | ICD-10-CM

## 2019-12-30 DIAGNOSIS — M6281 Muscle weakness (generalized): Secondary | ICD-10-CM

## 2019-12-30 NOTE — Therapy (Signed)
[facility] [address] [place], [place], [address] Phone: [phone]   Fax:  [phone]  Physical Therapy Treatment  Patient Details  Name: Douglas Hendricks MRN: [id number] Date of Birth: 27-Aug-1956 Referring Provider (PT): Dr. [name]   Encounter Date: 12/30/2019  PT End of Session - 12/30/19 0755    Visit Number  2    Date for PT Re-Evaluation  02/19/20    Authorization Type  [employer]HC    PT Start Time  0800    PT Stop Time  0840    PT Time Calculation (min)  40 min    Activity Tolerance  Patient tolerated treatment well    Behavior During Therapy  [facility] for tasks assessed/performed       Past Medical History:  Diagnosis Date  . Arthritis   . Hernia, inguinal, left 2017  . Hyperlipidemia   . Thoracic stomach hernia 2015    Past Surgical History:  Procedure Laterality Date  . ABDOMINAL HERNIA REPAIR    . hip pain Bilateral   . HIP SURGERY Right   . INGUINAL HERNIA REPAIR Left   . INNER EAR SURGERY Right   . KNEE SURGERY Left   . SHOULDER SURGERY    . TOTAL HIP ARTHROPLASTY     right  . TYMPANOSTOMY Right     There were no vitals filed for this visit.  Subjective Assessment - 12/30/19 0805    Subjective  I did some power washing yesterday. My pain travels.    Pertinent History  2019 October right rotator cuff repair [facility] PT at [facility]    Limitations  House hold activities    Diagnostic tests  MRI tendonitis; arthritis, minor tears in Rotator cuff    Patient Stated Goals  I want to avoid another surgery    Currently in Pain?  Yes    Pain Score  3     Pain Location  Shoulder    Pain Orientation  Left    Pain Descriptors / Indicators  Aching    Pain Type  Chronic pain    Pain Onset  More than a month ago    Pain Frequency  Intermittent    Aggravating Factors   overhead activitiy, laying on left shoulder, swimming    Pain Relieving Factors  ice    Multiple Pain Sites  No                        [facility] Adult PT Treatment/Exercise - 12/30/19 0001      Shoulder Exercises: ROM/Strengthening   Ranger  ranger on left shoulder for flexion and abduction with slowly increasing how high the ranger is, working on the scapula glenohumeral movement      Modalities   Modalities  Iontophoresis      Iontophoresis   Type of Iontophoresis  Dexamethasone    Location  left RTC insertion    Dose  1 ml, #1    Time  4 hour patch      Manual Therapy   Manual Therapy  Soft tissue mobilization;Joint mobilization    Joint Mobilization  PA mobilization to T3-T6 to help with thoracic mobility with shoulder movement    Soft tissue mobilization  to left upper quadrant to elongate after dry needling, release of the left pectoralis, release along the anterior left shoulder and A-C joint       Trigger Point Dry Needling - 12/30/19 0001    Consent Given?  Yes    Education Handout Provided  Yes    Muscles Treated Head and Neck  Levator scapulae   left   Muscles Treated Upper Quadrant  Subscapularis;Infraspinatus;Longissimus   left   Levator Scapulae Response  Twitch response elicited;Palpable increased muscle length    Infraspinatus Response  Twitch response elicited;Palpable increased muscle length    Subscapularis Response  Twitch response elicited;Palpable increased muscle length    Longissimus Response  Twitch response elicited;Palpable increased muscle length           PT Education - 12/30/19 0930    Education Details  information on dry needling and iontophoresis    Person(s) Educated  Patient    Methods  Explanation;Handout    Comprehension  Verbalized understanding       PT Short Term Goals - 12/25/19 1249      PT SHORT TERM GOAL #1   Title  be independent in initial HEP    Time  4    Period  Weeks    Status  New    Target Date  01/22/20      PT SHORT TERM GOAL #2   Title  The patient will have 170 degrees of left shoulder flexion needed for  reaching    Time  4    Period  Weeks    Status  New      PT SHORT TERM GOAL #3   Title  The patient will report a 30% improvement in left shoulder pain with turning in bed, pulling or sudden movements    Time  4    Period  Weeks    Status  New      PT SHORT TERM GOAL #4   Title  ...        PT Long Term Goals - 12/25/19 1251      PT LONG TERM GOAL #1   Title  be independent in advanced HEP    Time  8    Period  Weeks    Status  New      PT LONG TERM GOAL #2   Title  reduce FOTO to < or = to 27% limitation    Time  8    Period  Weeks    Status  New      PT LONG TERM GOAL #3   Title  The patient will have 4/5 external rotator strength and otherwise 5-/5 glenohumeral and scapular strength    Time  8    Status  New      PT LONG TERM GOAL #4   Title  The patient will report a > 50% improvement in left shoulder pain with pulling/pushing and usual home and volunteer activities    Time  8    Period  Weeks    Status  New      PT LONG TERM GOAL #5   Title  ...            Plan - 12/30/19 0758    Clinical Impression Statement  Patient reports his pain is intermittent. Patient was able to move his left shoulder better when the therapist assisted the scapular movement. Patient has increased trigger points in the left RTC, longissimus muscle, and pectoralis muscles. Patient reports his pain will travel around in his shoulder. Patient will benefit from PT to decrease pain with function and glenohumeral and scapular strength.    Personal Factors and Comorbidities  Comorbidity 1    Comorbidities  history of right rotator  cuff repair    Examination-Activity Limitations  Bed Mobility;Carry;Lift    Examination-Participation Restrictions  Other;Volunteer    Stability/Clinical Decision Making  Stable/Uncomplicated    Rehab Potential  Good    PT Frequency  1x / week    PT Duration  8 weeks    PT Treatment/Interventions  ADLs/Self Care Home Management;Cryotherapy;Electrical  Stimulation;Ultrasound;Moist Heat;Iontophoresis 4mg /ml Dexamethasone;Therapeutic exercise;Neuromuscular re-education;Manual techniques;Therapeutic activities;Patient/family education;Dry needling;Taping    PT Next Visit Plan  middle and lower trap strengthening; deltoid isometrics;  biceps, tricep and lat strengthening;  assess dry needling to the left shoulder, assess ionto    PT Home Exercise Plan  Access Code: [email or web address]    Recommended Other Services  MD signed initial eval    Consulted and Agree with Plan of Care  Patient       Patient will benefit from skilled therapeutic intervention in order to improve the following deficits and impairments:  Decreased range of motion, Impaired UE functional use, Pain, Decreased strength, Impaired perceived functional ability  Visit Diagnosis: Chronic left shoulder pain  Muscle weakness (generalized)  Stiffness of left shoulder, not elsewhere classified     Problem List Patient Active Problem List   Diagnosis Date Noted  . Pain in left shoulder 08/19/2019  . Acromioclavicular sprain, left, initial encounter 07/09/2019  . Chronic right shoulder pain 08/25/2018  . Ventral hernia without obstruction or gangrene 03/18/2014    Earlie Counts, PT 12/30/19 10:14 AM   [facility] [address] [place], [place], [address] Phone: [phone]   Fax:  [phone]  Name: Douglas Hendricks MRN: [id number] Date of Birth: Jun 09, 1956

## 2019-12-30 NOTE — Patient Instructions (Signed)
Trigger Point Dry Needling  . What is Trigger Point Dry Needling (DN)? o DN is a physical therapy technique used to treat muscle pain and dysfunction. Specifically, DN helps deactivate muscle trigger points (muscle knots).  o A thin filiform needle is used to penetrate the skin and stimulate the underlying trigger point. The goal is for a local twitch response (LTR) to occur and for the trigger point to relax. No medication of any kind is injected during the procedure.   . What Does Trigger Point Dry Needling Feel Like?  o The procedure feels different for each individual patient. Some patients report that they do not actually feel the needle enter the skin and overall the process is not painful. Very mild bleeding may occur. However, many patients feel a deep cramping in the muscle in which the needle was inserted. This is the local twitch response.   . How Will I feel after the treatment? o Soreness is normal, and the onset of soreness may not occur for a few hours. Typically this soreness does not last longer than two days.  o Bruising is uncommon, however; ice can be used to decrease any possible bruising.  o In rare cases feeling tired or nauseous after the treatment is normal. In addition, your symptoms may get worse before they get better, this period will typically not last longer than 24 hours.   . What Can I do After My Treatment? o Increase your hydration by drinking more water for the next 24 hours. o You may place ice or heat on the areas treated that have become sore, however, do not use heat on inflamed or bruised areas. Heat often brings more relief post needling. o You can continue your regular activities, but vigorous activity is not recommended initially after the treatment for 24 hours. o DN is best combined with other physical therapy such as strengthening, stretching, and other therapies.    [facility] [address], [address] [place], [place]  [address] Phone # [phone] Fax [phone]   IONTOPHORESIS PATIENT PRECAUTIONS & CONTRAINDICATIONS:  . Redness under one or both electrodes can occur.  This characterized by a uniform redness that usually disappears within 12 hours of treatment. . Small pinhead size blisters may result in response to the drug.  Contact your physician if the problem persists more than 24 hours. . On rare occasions, iontophoresis therapy can result in temporary skin reactions such as rash, inflammation, irritation or burns.  The skin reactions may be the result of individual sensitivity to the ionic solution used, the condition of the skin at the start of treatment, reaction to the materials in the electrodes, allergies or sensitivity to dexamethasone, or a poor connection between the patch and your skin.  Discontinue using iontophoresis if you have any of these reactions and report to your therapist. . Remove the Patch or electrodes if you have any undue sensation of pain or burning during the treatment and report discomfort to your therapist. . Tell your Therapist if you have had known adverse reactions to the application of electrical current. . If using the Patch, the LED light will turn off when treatment is complete and the patch can be removed.  Approximate treatment time is 1-3 hours.  Remove the patch when light goes off or after 6 hours. . The Patch can be worn during normal activity, however excessive motion where the electrodes have been placed can cause poor contact between the skin and the electrode or uneven electrical   current resulting in greater risk of skin irritation. . Keep out of the reach of children.   . DO NOT use if you have a cardiac pacemaker or any other electrically sensitive implanted device. . DO NOT use if you have a known sensitivity to dexamethasone. . DO NOT use during Magnetic Resonance Imaging (MRI). . DO NOT use over broken or compromised skin (e.g. sunburn, cuts, or acne)  due to the increased risk of skin reaction. . DO NOT SHAVE over the area to be treated:  To establish good contact between the Patch and the skin, excessive hair may be clipped. . DO NOT place the Patch or electrodes on or over your eyes, directly over your heart, or brain. . DO NOT reuse the Patch or electrodes as this may cause burns to occur.  

## 2020-01-05 ENCOUNTER — Other Ambulatory Visit: Payer: Self-pay

## 2020-01-05 ENCOUNTER — Encounter: Payer: Self-pay | Admitting: Physical Therapy

## 2020-01-05 ENCOUNTER — Ambulatory Visit: Payer: Commercial Managed Care - PPO | Admitting: Physical Therapy

## 2020-01-05 DIAGNOSIS — M25512 Pain in left shoulder: Secondary | ICD-10-CM | POA: Diagnosis not present

## 2020-01-05 DIAGNOSIS — M6281 Muscle weakness (generalized): Secondary | ICD-10-CM

## 2020-01-05 DIAGNOSIS — M25612 Stiffness of left shoulder, not elsewhere classified: Secondary | ICD-10-CM

## 2020-01-05 NOTE — Therapy (Signed)
[facility] [address] [place], [place], [address] Phone: [phone]   Fax:  [phone]  Physical Therapy Treatment  Patient Details  Name: Douglas Hendricks MRN: [id number] Date of Birth: Sep 10, 1955 Referring Provider (PT): Dr. [name]   Encounter Date: 01/05/2020  PT End of Session - 01/05/20 1151    Visit Number  3    Date for PT Re-Evaluation  02/19/20    Authorization Type  UHC    PT Start Time  1151    PT Stop Time  1234    PT Time Calculation (min)  43 min    Activity Tolerance  Patient tolerated treatment well    Behavior During Therapy  [facility] for tasks assessed/performed       Past Medical History:  Diagnosis Date  . Arthritis   . Hernia, inguinal, left 2017  . Hyperlipidemia   . Thoracic stomach hernia 2015    Past Surgical History:  Procedure Laterality Date  . ABDOMINAL HERNIA REPAIR    . hip pain Bilateral   . HIP SURGERY Right   . INGUINAL HERNIA REPAIR Left   . INNER EAR SURGERY Right   . KNEE SURGERY Left   . SHOULDER SURGERY    . TOTAL HIP ARTHROPLASTY     right  . TYMPANOSTOMY Right     There were no vitals filed for this visit.  Subjective Assessment - 01/05/20 1153    Subjective  Patient with very good response to DN and ionto, but then hurt shoulder on Friday when pulling rope out of propeller and it gave way. He had excruciating pain into deltoid area with shoulder extension. Pain has improved 50% each day since. When swimming yesterday he felt clunking in his shoulder with ER and IR (crawl stroke). No issue with breast stroke.    Pertinent History  2019 October right rotator cuff repair had PT at [facility]    Patient Stated Goals  I want to avoid another surgery    Currently in Pain?  Yes    Pain Score  2     Pain Location  Shoulder    Pain Orientation  Left    Pain Descriptors / Indicators  Aching;Dull                       [facility] Adult PT Treatment/Exercise -  01/05/20 0001      Exercises   Exercises  Shoulder      Shoulder Exercises: Prone   Extension  Left;20 reps;Weights    Extension Weight (lbs)  2    Extension Limitations  pain without wt at end range     Horizontal ABduction 1  Strengthening;Left;20 reps;Weights    Horizontal ABduction 1 Weight (lbs)  2    Horizontal ABduction 1 Limitations  T    Horizontal ABduction 2  Left;Weights;20 reps    Horizontal ABduction 2 Weight (lbs)  2    Horizontal ABduction 2 Limitations  Y pain without wt       Shoulder Exercises: Standing   External Rotation Limitations  pain with red    Internal Rotation  Strengthening;Left;12 reps;Theraband    Theraband Level (Shoulder Internal Rotation)  Level 2 (Red)      Shoulder Exercises: ROM/Strengthening   Other ROM/Strengthening Exercises  flexion, behind the head, extension and ABD      Shoulder Exercises: Isometric Strengthening   External Rotation  5X10"    ABduction  5X10"  Iontophoresis   Type of Iontophoresis  Dexamethasone    Location  left RTC insertion    Dose  1 ml, #2    Time  4 hour patch      Manual Therapy   Manual Therapy  Soft tissue mobilization;Joint mobilization;Passive ROM    Manual therapy comments  skilled palpation and monitoring of soft tissues during DN    Soft tissue mobilization  to left pectoralis and deltoids.    Passive ROM  to left shouder into IR/ER       Trigger Point Dry Needling - 01/05/20 0001    Consent Given?  Yes    Education Handout Provided  Previously provided    Muscles Treated Upper Quadrant  Pectoralis major;Pectoralis minor;Deltoid    Dry Needling Comments  left    Pectoralis Major Response  Twitch response elicited;Palpable increased muscle length    Pectoralis Minor Response  Palpable increased muscle length;Twitch response elicited    Deltoid Response  Twitch response elicited;Palpable increased muscle length             PT Short Term Goals - 12/25/19 1249      PT SHORT TERM  GOAL #1   Title  be independent in initial HEP    Time  4    Period  Weeks    Status  New    Target Date  01/22/20      PT SHORT TERM GOAL #2   Title  The patient will have 170 degrees of left shoulder flexion needed for reaching    Time  4    Period  Weeks    Status  New      PT SHORT TERM GOAL #3   Title  The patient will report a 30% improvement in left shoulder pain with turning in bed, pulling or sudden movements    Time  4    Period  Weeks    Status  New      PT SHORT TERM GOAL #4   Title  ...        PT Long Term Goals - 12/25/19 1251      PT LONG TERM GOAL #1   Title  be independent in advanced HEP    Time  8    Period  Weeks    Status  New      PT LONG TERM GOAL #2   Title  reduce FOTO to < or = to 27% limitation    Time  8    Period  Weeks    Status  New      PT LONG TERM GOAL #3   Title  The patient will have 4/5 external rotator strength and otherwise 5-/5 glenohumeral and scapular strength    Time  8    Status  New      PT LONG TERM GOAL #4   Title  The patient will report a > 50% improvement in left shoulder pain with pulling/pushing and usual home and volunteer activities    Time  8    Period  Weeks    Status  New      PT LONG TERM GOAL #5   Title  ...            Plan - 01/05/20 1653    Clinical Impression Statement  Patient reports initial relief after last session, but then he reinjured shoulder on 01/01/20 pulling a rope from a boat propeller and had significant pain increase. Pain has decreased  but he is experiencing clunking sound when swimming the crawl. No pain with breast stroke. Patient with very tight pectoralis muscles today. PT could not reproduce clunk, but pt had relief with post glide of humeral head during shoulder IR and ER. Great release of pectorals with DN and manual therapy today with no pain reported with passive IR/ER afterwards.    Comorbidities  history of right rotator cuff repair    PT Treatment/Interventions   ADLs/Self Care Home Management;Cryotherapy;Electrical Stimulation;Ultrasound;Moist Heat;Iontophoresis 4mg /ml Dexamethasone;Therapeutic exercise;Neuromuscular re-education;Manual techniques;Therapeutic activities;Patient/family education;Dry needling;Taping    PT Next Visit Plan  middle and lower trap strengthening; deltoid isometrics;  biceps, tricep and lat strengthening;  assess dry needling to the left shoulder, assess ionto    PT Home Exercise Plan  Access Code:       Patient will benefit from skilled therapeutic intervention in order to improve the following deficits and impairments:  Decreased range of motion, Impaired UE functional use, Pain, Decreased strength, Impaired perceived functional ability  Visit Diagnosis: Chronic left shoulder pain  Muscle weakness (generalized)  Stiffness of left shoulder, not elsewhere classified     Problem List Patient Active Problem List   Diagnosis Date Noted  . Pain in left shoulder 08/19/2019  . Acromioclavicular sprain, left, initial encounter 07/09/2019  . Chronic right shoulder pain 08/25/2018  . Ventral hernia without obstruction or gangrene 03/18/2014    03/20/2014 PT 01/05/2020, 9:44 PM  [facility] [address] [place], [place], [place] Phone: [phone]   Fax:  [phone]  Name: Ferman Basilio MRN: Dwyane Luo Date of Birth: 1955-12-07

## 2020-01-13 ENCOUNTER — Encounter: Payer: Self-pay | Admitting: Physical Therapy

## 2020-01-13 ENCOUNTER — Other Ambulatory Visit: Payer: Self-pay

## 2020-01-13 ENCOUNTER — Ambulatory Visit: Payer: Commercial Managed Care - PPO | Admitting: Physical Therapy

## 2020-01-13 DIAGNOSIS — G8929 Other chronic pain: Secondary | ICD-10-CM

## 2020-01-13 DIAGNOSIS — M25512 Pain in left shoulder: Secondary | ICD-10-CM | POA: Diagnosis not present

## 2020-01-13 DIAGNOSIS — M25612 Stiffness of left shoulder, not elsewhere classified: Secondary | ICD-10-CM

## 2020-01-13 DIAGNOSIS — M6281 Muscle weakness (generalized): Secondary | ICD-10-CM

## 2020-01-13 NOTE — Therapy (Signed)
[facility] [address] [place], [place], [address] Phone: [phone]   Fax:  [phone]  Physical Therapy Treatment  Patient Details  Name: Douglas Hendricks MRN: [id number] Date of Birth: 1955/11/22 Referring Provider (PT): Dr. [name]   Encounter Date: 01/13/2020  PT End of Session - 01/13/20 0841    Visit Number  4    Date for PT Re-Evaluation  02/19/20    Authorization Type  UHC    Authorization - Visit Number  4    Authorization - Number of Visits  60    PT Start Time  0800    PT Stop Time  0840    PT Time Calculation (min)  40 min    Activity Tolerance  Patient tolerated treatment well;No increased pain    Behavior During Therapy  WFL for tasks assessed/performed       Past Medical History:  Diagnosis Date  . Arthritis   . Hernia, inguinal, left 2017  . Hyperlipidemia   . Thoracic stomach hernia 2015    Past Surgical History:  Procedure Laterality Date  . ABDOMINAL HERNIA REPAIR    . hip pain Bilateral   . HIP SURGERY Right   . INGUINAL HERNIA REPAIR Left   . INNER EAR SURGERY Right   . KNEE SURGERY Left   . SHOULDER SURGERY    . TOTAL HIP ARTHROPLASTY     right  . TYMPANOSTOMY Right     There were no vitals filed for this visit.  Subjective Assessment - 01/13/20 0803    Subjective  Went swimming and did well. When swimming I had less clicking, 40% less with less pain.    Pertinent History  2019 October right rotator cuff repair had PT at [facility]    Limitations  House hold activities    Diagnostic tests  MRI tendonitis; arthritis, minor tears in Rotator cuff    Patient Stated Goals  I want to avoid another surgery    Currently in Pain?  Yes    Pain Score  2     Pain Location  Shoulder    Pain Orientation  Left    Pain Descriptors / Indicators  Aching;Dull    Pain Type  Chronic pain    Pain Onset  More than a month ago    Pain Frequency  Intermittent    Aggravating Factors   overhead  activity, laying on left shoulder, swimming    Pain Relieving Factors  ice    Multiple Pain Sites  No         [facility] PT Assessment - 01/13/20 0001      Strength   Left Shoulder Flexion  4+/5    Left Shoulder Extension  4+/5    Left Shoulder ABduction  4+/5    Left Shoulder Internal Rotation  4+/5    Left Shoulder External Rotation  4-/5    Left Shoulder Horizontal ABduction  4/5                    [facility] Adult PT Treatment/Exercise - 01/13/20 0001      Shoulder Exercises: Supine   Protraction  Strengthening;Both;15 reps;Theraband    Theraband Level (Shoulder Protraction)  Level 3 (Green)      Shoulder Exercises: Prone   Extension  20 reps;Weights;Both    Extension Weight (lbs)  2    Horizontal ABduction 1  Strengthening;20 reps;Weights;Both    Horizontal ABduction 1 Weight (lbs)  2  Horizontal ABduction 1 Limitations  T palm down, then thumb up    Horizontal ABduction 2  Weights;20 reps;Both    Horizontal ABduction 2 Weight (lbs)  1    Horizontal ABduction 2 Limitations  Y pain without wt       Shoulder Exercises: Sidelying   Other Sidelying Exercises  side plank on knees bil. 10x      Shoulder Exercises: Standing   External Rotation Limitations  pain with red    Internal Rotation  Strengthening;Left;12 reps;Theraband    Theraband Level (Shoulder Internal Rotation)  Level 2 (Red)    Internal Rotation Limitations  towel roll between shoulder and body      Modalities   Modalities  Iontophoresis      Iontophoresis   Type of Iontophoresis  Dexamethasone    Location  left RTC insertion    Dose  1 ml, #3    Time  4 hour patch             PT Education - 01/13/20 0841    Education Details  Access Code: [email or web address]    Person(s) Educated  Patient    Methods  Explanation;Demonstration;Verbal cues;Handout    Comprehension  Verbalized understanding;Returned demonstration       PT Short Term Goals - 01/13/20 0844      PT SHORT TERM GOAL #1   Title   be independent in initial HEP    Time  4    Period  Weeks    Status  Achieved      PT SHORT TERM GOAL #2   Title  The patient will have 170 degrees of left shoulder flexion needed for reaching    Time  4    Period  Weeks    Status  Achieved      PT SHORT TERM GOAL #3   Title  The patient will report a 30% improvement in left shoulder pain with turning in bed, pulling or sudden movements    Baseline  --    Time  4    Period  Weeks    Status  On-going        PT Long Term Goals - 12/25/19 1251      PT LONG TERM GOAL #1   Title  be independent in advanced HEP    Time  8    Period  Weeks    Status  New      PT LONG TERM GOAL #2   Title  reduce FOTO to < or = to 27% limitation    Time  8    Period  Weeks    Status  New      PT LONG TERM GOAL #3   Title  The patient will have 4/5 external rotator strength and otherwise 5-/5 glenohumeral and scapular strength    Time  8    Status  New      PT LONG TERM GOAL #4   Title  The patient will report a > 50% improvement in left shoulder pain with pulling/pushing and usual home and volunteer activities    Time  8    Period  Weeks    Status  New      PT LONG TERM GOAL #5   Title  ...            Plan - 01/13/20 0808    Clinical Impression Statement  Patient has progressed his HEP and monitored with pain the the advanced exercises. Patient is now  doing both shoulders together to get more scapular strength. Patient did not have trigger points in the left shoulder girdle. Patient is having less clunking and pain with swimming. Patient is working on his shoulder stabilty. Patient will benefit from skilled therapy to work on manual work and strength of left shoulder.    Personal Factors and Comorbidities  Comorbidity 1    Comorbidities  history of right rotator cuff repair    Examination-Activity Limitations  Bed Mobility;Carry;Lift    Stability/Clinical Decision Making  Stable/Uncomplicated    Rehab Potential  Good    PT  Frequency  1x / week    PT Duration  8 weeks    PT Treatment/Interventions  ADLs/Self Care Home Management;Cryotherapy;Electrical Stimulation;Ultrasound;Moist Heat;Iontophoresis 4mg /ml Dexamethasone;Therapeutic exercise;Neuromuscular re-education;Manual techniques;Therapeutic activities;Patient/family education;Dry needling;Taping    PT Next Visit Plan  ionto #4; dry needling to upper trap and levator if needed, work on closed chain for stability of left shoulder    PT Home Exercise Plan  Access Code:    Consulted and Agree with Plan of Care  Patient       Patient will benefit from skilled therapeutic intervention in order to improve the following deficits and impairments:  Decreased range of motion, Impaired UE functional use, Pain, Decreased strength, Impaired perceived functional ability  Visit Diagnosis: Chronic left shoulder pain  Muscle weakness (generalized)  Stiffness of left shoulder, not elsewhere classified     Problem List Patient Active Problem List   Diagnosis Date Noted  . Pain in left shoulder 08/19/2019  . Acromioclavicular sprain, left, initial encounter 07/09/2019  . Chronic right shoulder pain 08/25/2018  . Ventral hernia without obstruction or gangrene 03/18/2014    03/20/2014, PT 01/13/20 8:45 AM   [facility] [address], [address] [place], [place], [place] Phone: [phone]   Fax:  [phone]  Name: Douglas Hendricks MRN: Dwyane Luo Date of Birth: 04-Mar-1956

## 2020-01-13 NOTE — Patient Instructions (Signed)
Access Code: [email or web address] URL: [email or web address] Date: 12/30/2019 Prepared by: Eulis Foster  Exercises Prone Shoulder Horizontal Abduction with External Rotation - 1 x daily - 7 x weekly - 2 sets - 10 reps Prone Horizontal Abduction with Palms Down - 1 x daily - 7 x weekly - 2 sets - 10 reps Prone Lower Trapezius Strengthening on Swiss Ball - 1 x daily - 7 x weekly - 2 sets - 10 reps Prone Shoulder Extension with Dumbbells - 1 x daily - 7 x weekly - 2 sets - 10 reps Shoulder External Rotation with Anchored Resistance - 1 x daily - 7 x weekly - 1 sets - 10 reps Supine Serratus Punches Resistance - 1 x daily - 7 x weekly - 3 sets - 10 reps Side Plank on Knees - 1 x daily - 7 x weekly - 1 sets - 10 reps  Patient Education Trigger Point Dry Needling [place] Patient Instructions [facility] [address] [place], [place] [address] Phone # [phone] Fax [phone]

## 2020-01-21 ENCOUNTER — Ambulatory Visit: Payer: Commercial Managed Care - PPO | Admitting: Physical Therapy

## 2020-01-21 ENCOUNTER — Telehealth: Payer: Self-pay | Admitting: Orthopaedic Surgery

## 2020-01-21 ENCOUNTER — Encounter: Payer: Self-pay | Admitting: Physical Therapy

## 2020-01-21 ENCOUNTER — Other Ambulatory Visit: Payer: Self-pay

## 2020-01-21 DIAGNOSIS — M25612 Stiffness of left shoulder, not elsewhere classified: Secondary | ICD-10-CM

## 2020-01-21 DIAGNOSIS — G8929 Other chronic pain: Secondary | ICD-10-CM

## 2020-01-21 DIAGNOSIS — M6281 Muscle weakness (generalized): Secondary | ICD-10-CM

## 2020-01-21 DIAGNOSIS — M25512 Pain in left shoulder: Secondary | ICD-10-CM | POA: Diagnosis not present

## 2020-01-21 NOTE — Telephone Encounter (Signed)
Spoke with patient. He is coming in to discuss with Dr.Whitfield before proceeding.

## 2020-01-21 NOTE — Patient Instructions (Signed)
Access Code: [email or web address] URL: [email or web address] Date: 12/30/2019 Prepared by: Eulis Foster  Exercises Prone Horizontal Abduction with Palms Down - 1 x daily - 7 x weekly - 2 sets - 10 reps Prone Lower Trapezius Strengthening on Swiss Ball - 1 x daily - 7 x weekly - 2 sets - 10 reps Prone Shoulder Extension with Dumbbells - 1 x daily - 7 x weekly - 2 sets - 10 reps Shoulder External Rotation with Anchored Resistance - 1 x daily - 7 x weekly - 1 sets - 10 reps Supine Serratus Punches Resistance - 1 x daily - 7 x weekly - 3 sets - 10 reps Side Plank on Knees - 1 x daily - 7 x weekly - 1 sets - 10 reps  Patient Education Trigger Point Dry Needling [place] Patient Instructions Cont[employer]

## 2020-01-21 NOTE — Telephone Encounter (Signed)
Will complete surgery sheet-call pt and ask if he wants to come in prior to surgery or just schedule

## 2020-01-21 NOTE — Telephone Encounter (Signed)
Patient called stating that PT is helping, but not solving the problem.  The patient is wanting to move forward with the surgery.  CB#[phone].  Thank you.

## 2020-01-21 NOTE — Telephone Encounter (Signed)
Please advise. Do you want patient to come in to discuss? Or do you want to call?

## 2020-01-21 NOTE — Therapy (Signed)
[facility] [address], [place], [place], [address] Phone: [phone]   Fax:  [phone]  Physical Therapy Treatment  Patient Details  Name: Douglas Hendricks MRN: [id number] Date of Birth: 1956-04-16 Referring Provider (PT): Dr. Durward Fortes   Encounter Date: 01/21/2020  PT End of Session - 01/21/20 0803    Visit Number  5    Date for PT Re-Evaluation  02/19/20    Authorization Type  UHC    Authorization - Visit Number  5    Authorization - Number of Visits  60    PT Start Time  0800    PT Stop Time  0840    PT Time Calculation (min)  40 min    Activity Tolerance  Patient tolerated treatment well;No increased pain    Behavior During Therapy  WFL for tasks assessed/performed       Past Medical History:  Diagnosis Date  . Arthritis   . Hernia, inguinal, left 2017  . Hyperlipidemia   . Thoracic stomach hernia 2015    Past Surgical History:  Procedure Laterality Date  . ABDOMINAL HERNIA REPAIR    . hip pain Bilateral   . HIP SURGERY Right   . INGUINAL HERNIA REPAIR Left   . INNER EAR SURGERY Right   . KNEE SURGERY Left   . SHOULDER SURGERY    . TOTAL HIP ARTHROPLASTY     right  . TYMPANOSTOMY Right     There were no vitals filed for this visit.  Subjective Assessment - 01/21/20 0807    Subjective  I swam 3 times and had pain level vrom 5/10 then 4/10 and last swim pain level was intermitternt 3/10.    Pertinent History  2019 October right rotator cuff repair had PT at [facility]    Diagnostic tests  MRI tendonitis; arthritis, minor tears in Rotator cuff    Patient Stated Goals  I want to avoid another surgery    Currently in Pain?  Yes    Pain Location  Shoulder   localized in left RTC insertion   Pain Orientation  Left    Pain Descriptors / Indicators  Aching    Pain Type  Chronic pain    Pain Onset  More than a month ago    Pain Frequency  Intermittent    Aggravating Factors   bringing left arm up and  over for swimming, paddle board    Pain Relieving Factors  ice                        [facility] Adult PT Treatment/Exercise - 01/21/20 0001      Self-Care   Self-Care  Other Self-Care Comments    Other Self-Care Comments   educated patient on how to use heat for 3 mintues and ice for 1 minute 3 times to reduce inflammation to left RTC tendon      Shoulder Exercises: Prone   Horizontal ABduction 1  Strengthening;20 reps;Weights;Both    Horizontal ABduction 1 Limitations  T palm down, then thumb up    Horizontal ABduction 2  Strengthening;Both;5 reps    Horizontal ABduction 2 Limitations  increased pain so stopped      Shoulder Exercises: ROM/Strengthening   UBE (Upper Arm Bike)  level 2 for going forward 2 min; backward 3 mph      Modalities   Modalities  Iontophoresis      Iontophoresis   Type of Iontophoresis  Dexamethasone  Location  left RTC insertion    Dose  1 ml, #4    Time  4 hour patch      Manual Therapy   Manual Therapy  Soft tissue mobilization    Manual therapy comments  skilled palpation and monitoring of soft tissues during DN    Soft tissue mobilization  left rhomboid, upper trap, and levator scapulae to elongate muscle after dry needling; tranverse friction massage to RTC tendon and educated patient on how to perform the same       Trigger Point Dry Needling - 01/21/20 0001    Consent Given?  Yes    Education Handout Provided  Previously provided    Muscles Treated Head and Neck  Upper trapezius;Levator scapulae   left   Muscles Treated Upper Quadrant  Rhomboids   left   Levator Scapulae Response  Twitch response elicited;Palpable increased muscle length    Rhomboids Response  Twitch response elicited;Palpable increased muscle length           PT Education - 01/21/20 0851    Education Details  Access Code: [email or web address]; alternate ice and heat to promote blood flow to the tendon; stopped horizontal abduction with thumb up; transverse  friction massage to left RTC insertion    Person(s) Educated  Patient    Methods  Explanation;Demonstration;Handout    Comprehension  Verbalized understanding;Returned demonstration       PT Short Term Goals - 01/21/20 0856      PT SHORT TERM GOAL #1   Title  be independent in initial HEP    Time  4    Period  Weeks    Status  Achieved    Target Date  01/22/20      PT SHORT TERM GOAL #2   Title  The patient will have 170 degrees of left shoulder flexion needed for reaching    Baseline  170    Time  4    Period  Weeks    Status  Achieved      PT SHORT TERM GOAL #3   Title  The patient will report a 30% improvement in left shoulder pain with turning in bed, pulling or sudden movements    Time  4    Period  Weeks        PT Long Term Goals - 12/25/19 1251      PT LONG TERM GOAL #1   Title  be independent in advanced HEP    Time  8    Period  Weeks    Status  New      PT LONG TERM GOAL #2   Title  reduce FOTO to < or = to 27% limitation    Time  8    Period  Weeks    Status  New      PT LONG TERM GOAL #3   Title  The patient will have 4/5 external rotator strength and otherwise 5-/5 glenohumeral and scapular strength    Time  8    Status  New      PT LONG TERM GOAL #4   Title  The patient will report a > 50% improvement in left shoulder pain with pulling/pushing and usual home and volunteer activities    Time  8    Period  Weeks    Status  New      PT LONG TERM GOAL #5   Title  .Marland KitchenMarland Kitchen  Plan - 01/21/20 0835    Clinical Impression Statement  Patient pain is now isolated to the left RTC insertion instead of being dispersed into the Deltoid, RTC, and pectoralis. Increased pain when he moves his left arm into an horizontal adduction and downward or upward. Trigger points in the left Rhomboid and levator scapulae. He is being consistent with his HEP. No goals met at this time. He is an active individual. Patient will benefit from skilled therapy to work  on manual work and strength of left shoulder.    Personal Factors and Comorbidities  Comorbidity 1    Comorbidities  history of right rotator cuff repair    Examination-Activity Limitations  Bed Mobility;Carry;Lift    Examination-Participation Restrictions  Other;Volunteer    Stability/Clinical Decision Making  Stable/Uncomplicated    Rehab Potential  Good    PT Frequency  1x / week    PT Duration  8 weeks    PT Treatment/Interventions  ADLs/Self Care Home Management;Cryotherapy;Electrical Stimulation;Ultrasound;Moist Heat;Iontophoresis '4mg'$ /ml Dexamethasone;Therapeutic exercise;Neuromuscular re-education;Manual techniques;Therapeutic activities;Patient/family education;Dry needling;Taping    PT Next Visit Plan  ionto #5; dry needling to upper trap and levator, rhomboids if needed, work on closed chain for stability of left shoulder; see if the pain is decreasing or do we need to discharge to HEP and be consulted with MD    PT Home Exercise Plan  Access Code: [email or web address]    Consulted and Agree with Plan of Care  Patient       Patient will benefit from skilled therapeutic intervention in order to improve the following deficits and impairments:  Decreased range of motion, Impaired UE functional use, Pain, Decreased strength, Impaired perceived functional ability  Visit Diagnosis: Chronic left shoulder pain  Muscle weakness (generalized)  Stiffness of left shoulder, not elsewhere classified     Problem List Patient Active Problem List   Diagnosis Date Noted  . Pain in left shoulder 08/19/2019  . Acromioclavicular sprain, left, initial encounter 07/09/2019  . Chronic right shoulder pain 08/25/2018  . Ventral hernia without obstruction or gangrene 03/18/2014    Earlie Counts, PT 01/21/20 8:58 AM   [facility] [address], [place], [place], [address] Phone: [phone]   Fax:  [phone]  Name: Douglas Hendricks MRN:  [id number] Date of Birth: [birth year]

## 2020-01-29 ENCOUNTER — Other Ambulatory Visit: Payer: Self-pay

## 2020-01-29 ENCOUNTER — Ambulatory Visit: Payer: Commercial Managed Care - PPO | Attending: Orthopaedic Surgery | Admitting: Physical Therapy

## 2020-01-29 DIAGNOSIS — M25612 Stiffness of left shoulder, not elsewhere classified: Secondary | ICD-10-CM | POA: Insufficient documentation

## 2020-01-29 DIAGNOSIS — M25512 Pain in left shoulder: Secondary | ICD-10-CM | POA: Insufficient documentation

## 2020-01-29 DIAGNOSIS — G8929 Other chronic pain: Secondary | ICD-10-CM | POA: Insufficient documentation

## 2020-01-29 DIAGNOSIS — M6281 Muscle weakness (generalized): Secondary | ICD-10-CM | POA: Diagnosis present

## 2020-01-29 NOTE — Patient Instructions (Signed)
Access Code: Access Code: [email or web address] URL: [email or web address] Date: 01/29/2020 Prepared by: Lavinia Sharps  Exercises Prone Horizontal Abduction with Palms Down - 1 x daily - 7 x weekly - 2 sets - 10 reps Prone Lower Trapezius Strengthening on Swiss Ball - 1 x daily - 7 x weekly - 2 sets - 10 reps Prone Shoulder Extension with Dumbbells - 1 x daily - 7 x weekly - 2 sets - 10 reps Shoulder External Rotation with Anchored Resistance - 1 x daily - 7 x weekly - 1 sets - 10 reps Supine Serratus Punches Resistance - 1 x daily - 7 x weekly - 3 sets - 10 reps Side Plank on Knees - 1 x daily - 7 x weekly - 1 sets - 10 reps Shoulder Flexion Wall Walk - 1 x daily - 7 x weekly - 1 sets - 10 reps  Patient Education Trigger Point Dry Needling [place] Patient Instructions Contrast [employer] URL: [email or web address] Date: 01/29/2020 Prepared by: Lavinia Sharps  Exercises Prone Horizontal Abduction with Palms Down - 1 x daily - 7 x weekly - 2 sets - 10 reps Prone Lower Trapezius Strengthening on Swiss Ball - 1 x daily - 7 x weekly - 2 sets - 10 reps Prone Shoulder Extension with Dumbbells - 1 x daily - 7 x weekly - 2 sets - 10 reps Shoulder External Rotation with Anchored Resistance - 1 x daily - 7 x weekly - 1 sets - 10 reps Supine Serratus Punches Resistance - 1 x daily - 7 x weekly - 3 sets - 10 reps Side Plank on Knees - 1 x daily - 7 x weekly - 1 sets - 10 reps Shoulder Flexion Wall Walk - 1 x daily - 7 x weekly - 1 sets - 10 reps  Patient Education Trigger Point Dry Needling [place] Patient Instructions Contrast [employer]

## 2020-01-29 NOTE — Therapy (Signed)
[facility] [address] [place], [place], [address] Phone: [phone]   Fax:  [phone]  Physical Therapy Treatment/Discharge Summary   Patient Details  Name: Douglas Hendricks MRN: [id number] Date of Birth: [birth year] Referring Provider (PT): Dr. Durward Fortes   Encounter Date: 01/29/2020  PT End of Session - 01/29/20 1259    Visit Number  6    Date for PT Re-Evaluation  02/19/20    Authorization Type  UHC    Authorization - Visit Number  6    Authorization - Number of Visits  60    PT Start Time  0845    PT Stop Time  0926    PT Time Calculation (min)  41 min    Activity Tolerance  Patient tolerated treatment well;No increased pain       Past Medical History:  Diagnosis Date  . Arthritis   . Hernia, inguinal, left 2017  . Hyperlipidemia   . Thoracic stomach hernia 2015    Past Surgical History:  Procedure Laterality Date  . ABDOMINAL HERNIA REPAIR    . hip pain Bilateral   . HIP SURGERY Right   . INGUINAL HERNIA REPAIR Left   . INNER EAR SURGERY Right   . KNEE SURGERY Left   . SHOULDER SURGERY    . TOTAL HIP ARTHROPLASTY     right  . TYMPANOSTOMY Right     There were no vitals filed for this visit.  Subjective Assessment - 01/29/20 0846    Subjective  The DN helped.  Now the pain is isolated to the anterior/lateral  shoulder region.  Although last night had some upper arm pain.  I do have an appt with MD to discuss surgery.  Swimming 2 miles/week.  Reduced pain while wearing the patch but then pain returns.  Stand up paddle boarding bothers me after 1 hour.    Pertinent History  2019 October right rotator cuff repair [facility] PT at [facility]    Currently in Pain?  Yes    Pain Score  2     Pain Location  Shoulder    Pain Orientation  Left         [facility] PT Assessment - 01/29/20 0001      Observation/Other Assessments   Focus on Therapeutic Outcomes (FOTO)   31% limitation      AROM   Left Shoulder Flexion   167 Degrees    Left Shoulder ABduction  174 Degrees    Left Shoulder Internal Rotation  --   T8   Left Shoulder External Rotation  66 Degrees      Strength   Left Shoulder Flexion  5/5    Left Shoulder Extension  5/5    Left Shoulder ABduction  4+/5    Left Shoulder Internal Rotation  4+/5    Left Shoulder External Rotation  4-/5    Left Shoulder Horizontal ABduction  5/5      Special Tests   Other special tests  +Hawkins Kennedy +lat sign at 0 degrees abduction                     [facility] Adult PT Treatment/Exercise - 01/29/20 0001      Shoulder Exercises: Prone   Other Prone Exercises  review of technique with prone Ts and Ys      Shoulder Exercises: Standing   External Rotation  Strengthening;Left;10 reps;Theraband    Theraband Level (Shoulder External Rotation)  Level 2 (Red)  External Rotation Limitations  small arc of movement and dynamic isometrics (side stepping with arm fixed)     Other Standing Exercises  wall walk with red band around wrists 10x     Other Standing Exercises  wall red band scoops 10x and clocks 5x       Shoulder Exercises: ROM/Strengthening   UBE (Upper Arm Bike)  5 min while discussing progress/status       Iontophoresis   Type of Iontophoresis  Dexamethasone    Location  left RTC insertion    Dose  1 ml, #5    Time  4 hour patch             PT Education - 01/29/20 1258    Education Details  dynamic isometric external rotation;  wall band walks    Person(s) Educated  Patient    Methods  Explanation;Demonstration;Handout    Comprehension  Verbalized understanding;Returned demonstration       PT Short Term Goals - 01/29/20 1306      PT SHORT TERM GOAL #1   Title  be independent in initial HEP    Status  Achieved      PT SHORT TERM GOAL #2   Title  The patient will have 170 degrees of left shoulder flexion needed for reaching    Status  Achieved      PT SHORT TERM GOAL #3   Title  The patient will report a 30%  improvement in left shoulder pain with turning in bed, pulling or sudden movements    Status  Not Met        PT Long Term Goals - 01/29/20 1306      PT LONG TERM GOAL #1   Title  be independent in advanced HEP    Status  Achieved      PT LONG TERM GOAL #2   Title  reduce FOTO to < or = to 27% limitation    Status  Partially Met      PT LONG TERM GOAL #3   Title  The patient will have 4/5 external rotator strength and otherwise 5-/5 glenohumeral and scapular strength    Status  Partially Met      PT LONG TERM GOAL #4   Title  The patient will report a > 50% improvement in left shoulder pain with pulling/pushing and usual home and volunteer activities    Status  Not Met            Plan - 01/29/20 0905    Clinical Impression Statement  The patient reports periscapular and pectoral region pain has been relieved with DN.  His pain is now localized to the anterior/lateral shoulder region.  His shoulder ROM is WNLs and strength is good except pain and weakness with external rotation and slight deficit with abduction.  + Michel Bickers.  He is highly compliant with his HEP however he continues to have pain with activities including paddleboarding and swimming.  He plans to follow up with his orthopedist and is considering rotator cuff surgery.  Will discharge from PT at this time with partial goals met.    Comorbidities  history of right rotator cuff repair    Rehab Potential  Good    PT Frequency  1x / week    PT Duration  8 weeks    PT Treatment/Interventions  ADLs/Self Care Home Management;Cryotherapy;Electrical Stimulation;Ultrasound;Moist Heat;Iontophoresis '4mg'$ /ml Dexamethasone;Therapeutic exercise;Neuromuscular re-education;Manual techniques;Therapeutic activities;Patient/family education;Dry needling;Taping    PT Next Visit Plan  discharge  PT Home Exercise Plan  Access Code: [email or web address]       Patient will benefit from skilled therapeutic intervention in order to improve the  following deficits and impairments:  Decreased range of motion, Impaired UE functional use, Pain, Decreased strength, Impaired perceived functional ability  Visit Diagnosis: Chronic left shoulder pain  Muscle weakness (generalized)  Stiffness of left shoulder, not elsewhere classified    PHYSICAL THERAPY DISCHARGE SUMMARY  Visits from Start of Care: 6  Current functional level related to goals / functional outcomes: See clinical summary above   Remaining deficits: As above   Education / Equipment: HEP Plan: Patient agrees to discharge.  Patient goals were partially met. Patient is being discharged due to the patient's request.  ?????      Problem List Patient Active Problem List   Diagnosis Date Noted  . Pain in left shoulder 08/19/2019  . Acromioclavicular sprain, left, initial encounter 07/09/2019  . Chronic right shoulder pain 08/25/2018  . Ventral hernia without obstruction or gangrene 03/18/2014  Ruben Im, PT 01/29/20 1:11 PM Phone: [phone] Fax: [phone] Alvera Singh 01/29/2020, 1:09 PM  [facility] [address], [place], [place], [address] Phone: [phone]   Fax:  [phone]  Name: Douglas Hendricks MRN: [id number] Date of Birth: 1955/11/01

## 2020-02-02 ENCOUNTER — Encounter: Payer: 59 | Admitting: Physical Therapy

## 2020-02-09 ENCOUNTER — Encounter: Payer: 59 | Admitting: Physical Therapy

## 2020-02-11 ENCOUNTER — Ambulatory Visit: Payer: Commercial Managed Care - PPO | Admitting: Orthopaedic Surgery

## 2020-02-16 ENCOUNTER — Ambulatory Visit (INDEPENDENT_AMBULATORY_CARE_PROVIDER_SITE_OTHER): Payer: Commercial Managed Care - PPO | Admitting: Orthopaedic Surgery

## 2020-02-16 ENCOUNTER — Encounter: Payer: Self-pay | Admitting: Orthopaedic Surgery

## 2020-02-16 ENCOUNTER — Other Ambulatory Visit: Payer: Self-pay

## 2020-02-16 ENCOUNTER — Ambulatory Visit: Payer: 59 | Admitting: Physical Therapy

## 2020-02-16 VITALS — Ht 71.0 in | Wt 195.0 lb

## 2020-02-16 DIAGNOSIS — M7542 Impingement syndrome of left shoulder: Secondary | ICD-10-CM | POA: Diagnosis not present

## 2020-02-16 DIAGNOSIS — S4352XA Sprain of left acromioclavicular joint, initial encounter: Secondary | ICD-10-CM

## 2020-02-16 DIAGNOSIS — M25512 Pain in left shoulder: Secondary | ICD-10-CM | POA: Diagnosis not present

## 2020-02-16 DIAGNOSIS — G8929 Other chronic pain: Secondary | ICD-10-CM

## 2020-02-16 DIAGNOSIS — M75112 Incomplete rotator cuff tear or rupture of left shoulder, not specified as traumatic: Secondary | ICD-10-CM | POA: Diagnosis not present

## 2020-02-16 NOTE — Progress Notes (Signed)
Office Visit Note   Patient: Douglas Hendricks           Date of Birth: 1956-04-02           MRN: [id number] Visit Date: 02/16/2020              Requested by: Lawerance Cruel, [place],  [place] [address] PCP: Lawerance Cruel, MD   Assessment & Plan: Visit Diagnoses:  1. Chronic left shoulder pain   2. Nontraumatic incomplete tear of left rotator cuff   3. Impingement syndrome of left shoulder   4. Acromioclavicular sprain, left, initial encounter     Plan:  #1: At this time we had a long discussion about his pathology and symptoms.  He is not interested in any more conservative treatment.  He has completed his physical therapy.  He would like to proceed with surgical intervention in the very near future. #2: We will schedule him for a arthroscopic subacromial decompression with distal clavicle excision and as indicated possible biceps tenodesis and mini open rotator cuff repair with possible patch.  Follow-Up Instructions: No follow-ups on file.   Orders:  No orders of the defined types were placed in this encounter.  No orders of the defined types were placed in this encounter.     Procedures: No procedures performed   Clinical Data: No additional findings.   Subjective: Chief Complaint  Patient presents with  . Left Shoulder - Pain   HPI Patient presents today for left shoulder pain. He wants to discuss arthroscopic surgery. He takes Ibuprofen at bedtime if needed, due to difficulty sleeping with his shoulder pain. He is right hand dominant.  He has finished a course of physical therapy which has had some beneficial effect.  Still having symptoms though.  He is an avid Academic librarian.   Review of Systems  Constitutional: Negative for fatigue.  HENT: Negative for ear pain.   Eyes: Negative for photophobia and pain.  Respiratory: Negative for shortness of breath.   Cardiovascular: Negative for leg swelling.  Gastrointestinal: Negative for  constipation and diarrhea.  Endocrine: Negative for cold intolerance and heat intolerance.  Genitourinary: Negative for difficulty urinating.  Musculoskeletal: Positive for joint swelling.  Skin: Negative for rash.  Allergic/Immunologic: Negative for food allergies.  Neurological: Negative for weakness.  Hematological: Does not bruise/bleed easily.  Psychiatric/Behavioral: Positive for sleep disturbance.     Objective: Vital Signs: Ht 5\' 11"  (1.803 m)   Wt 195 lb (88.5 kg)   BMI 27.20 kg/m   Physical Exam Constitutional:      Appearance: Normal appearance. He is well-developed and normal weight.  HENT:     Head: Normocephalic.  Eyes:     Pupils: Pupils are equal, round, and reactive to light.  Pulmonary:     Effort: Pulmonary effort is normal.  Skin:    General: Skin is warm and dry.  Neurological:     Mental Status: He is alert and oriented to person, place, and time.  Psychiatric:        Behavior: Behavior normal.     Ortho Exam  Exam today reveals few degrees shy of forward flexion as well as abduction.  Positive empty can test.  Negative speeds test.  Some crepitance with range of motion.  Neurovascular intact.  Good strength.  Specialty Comments:  No specialty comments available.  Imaging:   PMFS History: Current Outpatient Medications  Medication Sig Dispense Refill  . finasteride (PROSCAR) 5 MG tablet Take  by mouth.    . Fish Oil OIL by Does not apply route.    . Ginkgo Biloba 60 MG CAPS Take by mouth.    . Ginkgo Biloba Extract (GINKGO BILOBA MEMORY ENHANCER) 60 MG CAPS Take by mouth.    . Glucosamine-Chondroitin ER 500-200 MG TBCR Take by mouth.    Marland Kitchen ibuprofen (ADVIL,MOTRIN) 200 MG tablet Take 200 mg by mouth every 6 (six) hours as needed.    . meloxicam (MOBIC) 15 MG tablet Take 15 mg by mouth daily.    . Misc Natural Products (GLUCOSAMINE CHONDROITIN ADV PO) Take by mouth.    . Multiple Vitamin (MULTIVITAMIN) capsule daily.    . nabumetone (RELAFEN)  500 MG tablet TAKE 1 TABLET BY MOUTH EVERY 12 HOURS AS NEEDED FOR SHOULDER PAIN  0  . oxyCODONE-acetaminophen (ROXICET) 5-325 MG per tablet Take 1 tablet by mouth every 4 (four) hours as needed. 40 tablet 0  . Red Yeast Rice 600 MG TABS Take by mouth.    . sildenafil (VIAGRA) 50 MG tablet Take by mouth.    . traMADol (ULTRAM) 50 MG tablet tramadol 50 mg tablet    . Turmeric 500 MG CAPS Take by mouth.     No current facility-administered medications for this visit.    Patient Active Problem List   Diagnosis Date Noted  . Partial tear of left rotator cuff 02/16/2020  . Impingement syndrome of left shoulder 02/16/2020  . Pain in left shoulder 08/19/2019  . Acromioclavicular sprain, left, initial encounter 07/09/2019  . Chronic right shoulder pain 08/25/2018  . Ventral hernia without obstruction or gangrene 03/18/2014   Past Medical History:  Diagnosis Date  . Arthritis   . Hernia, inguinal, left 2017  . Hyperlipidemia   . Thoracic stomach hernia 2015    Family History  Problem Relation Age of Onset  . Cancer Mother        pancreatic  . COPD Father   . Cancer Father        esophageal    Past Surgical History:  Procedure Laterality Date  . ABDOMINAL HERNIA REPAIR    . hip pain Bilateral   . HIP SURGERY Right   . INGUINAL HERNIA REPAIR Left   . INNER EAR SURGERY Right   . KNEE SURGERY Left   . SHOULDER SURGERY    . TOTAL HIP ARTHROPLASTY     right  . TYMPANOSTOMY Right    Social History   Occupational History  . Not on file  Tobacco Use  . Smoking status: Never Smoker  . Smokeless tobacco: Never Used  Vaping Use  . Vaping Use: Never used  Substance and Sexual Activity  . Alcohol use: Yes    Comment: 1-2  . Drug use: No  . Sexual activity: Not on file

## 2020-03-25 ENCOUNTER — Telehealth: Payer: Self-pay | Admitting: Orthopaedic Surgery

## 2020-03-25 NOTE — Telephone Encounter (Signed)
[facility]. He states his arm is doing significantly better.  Doesn't necessarily want to cancel, but not sure if he should continue or wait. Patient would like to speak with Dr [name] so he can advise.  Pt's cb  [phone]

## 2020-03-28 NOTE — Telephone Encounter (Signed)
Please call to discuss

## 2020-03-28 NOTE — Telephone Encounter (Signed)
Called-Think pt should cancel surgery until he is more symptomatic-he will call [name]

## 2020-03-28 NOTE — Telephone Encounter (Signed)
Please see below from Dr.Whitfield.

## 2020-04-19 ENCOUNTER — Inpatient Hospital Stay: Payer: Commercial Managed Care - PPO | Admitting: Orthopaedic Surgery

## 2020-04-27 HISTORY — PX: TYMPANOSTOMY: SHX2586

## 2020-09-22 ENCOUNTER — Other Ambulatory Visit: Payer: Self-pay | Admitting: Orthopaedic Surgery

## 2020-09-22 DIAGNOSIS — M25512 Pain in left shoulder: Secondary | ICD-10-CM

## 2020-09-22 DIAGNOSIS — G8929 Other chronic pain: Secondary | ICD-10-CM

## 2020-10-31 DIAGNOSIS — H90A31 Mixed conductive and sensorineural hearing loss, unilateral, right ear with restricted hearing on the contralateral side: Secondary | ICD-10-CM | POA: Diagnosis not present

## 2020-10-31 DIAGNOSIS — H90A22 Sensorineural hearing loss, unilateral, left ear, with restricted hearing on the contralateral side: Secondary | ICD-10-CM | POA: Diagnosis not present

## 2020-11-18 DIAGNOSIS — Z23 Encounter for immunization: Secondary | ICD-10-CM | POA: Diagnosis not present

## 2020-11-18 DIAGNOSIS — Z0000 Encounter for general adult medical examination without abnormal findings: Secondary | ICD-10-CM | POA: Diagnosis not present

## 2020-11-18 DIAGNOSIS — Z125 Encounter for screening for malignant neoplasm of prostate: Secondary | ICD-10-CM | POA: Diagnosis not present

## 2020-11-18 DIAGNOSIS — E7800 Pure hypercholesterolemia, unspecified: Secondary | ICD-10-CM | POA: Diagnosis not present

## 2020-12-30 DIAGNOSIS — R3915 Urgency of urination: Secondary | ICD-10-CM | POA: Diagnosis not present

## 2020-12-30 DIAGNOSIS — N401 Enlarged prostate with lower urinary tract symptoms: Secondary | ICD-10-CM | POA: Diagnosis not present

## 2021-01-13 DIAGNOSIS — H2513 Age-related nuclear cataract, bilateral: Secondary | ICD-10-CM | POA: Diagnosis not present

## 2021-01-13 DIAGNOSIS — H524 Presbyopia: Secondary | ICD-10-CM | POA: Diagnosis not present

## 2021-02-17 DIAGNOSIS — N401 Enlarged prostate with lower urinary tract symptoms: Secondary | ICD-10-CM | POA: Diagnosis not present

## 2021-02-17 DIAGNOSIS — R3915 Urgency of urination: Secondary | ICD-10-CM | POA: Diagnosis not present

## 2021-02-17 DIAGNOSIS — N529 Male erectile dysfunction, unspecified: Secondary | ICD-10-CM | POA: Diagnosis not present

## 2021-04-05 DIAGNOSIS — H2512 Age-related nuclear cataract, left eye: Secondary | ICD-10-CM | POA: Diagnosis not present

## 2021-04-05 DIAGNOSIS — H25812 Combined forms of age-related cataract, left eye: Secondary | ICD-10-CM | POA: Diagnosis not present

## 2021-06-30 DIAGNOSIS — Z20828 Contact with and (suspected) exposure to other viral communicable diseases: Secondary | ICD-10-CM | POA: Diagnosis not present

## 2021-06-30 DIAGNOSIS — R053 Chronic cough: Secondary | ICD-10-CM | POA: Diagnosis not present

## 2021-06-30 DIAGNOSIS — B349 Viral infection, unspecified: Secondary | ICD-10-CM | POA: Diagnosis not present

## 2021-06-30 DIAGNOSIS — R6883 Chills (without fever): Secondary | ICD-10-CM | POA: Diagnosis not present

## 2021-06-30 DIAGNOSIS — R52 Pain, unspecified: Secondary | ICD-10-CM | POA: Diagnosis not present

## 2021-07-03 DIAGNOSIS — R809 Proteinuria, unspecified: Secondary | ICD-10-CM | POA: Diagnosis not present

## 2021-07-03 DIAGNOSIS — R059 Cough, unspecified: Secondary | ICD-10-CM | POA: Diagnosis not present

## 2021-07-03 DIAGNOSIS — R945 Abnormal results of liver function studies: Secondary | ICD-10-CM | POA: Diagnosis not present

## 2021-07-03 DIAGNOSIS — J189 Pneumonia, unspecified organism: Secondary | ICD-10-CM | POA: Diagnosis not present

## 2021-07-03 DIAGNOSIS — Z20822 Contact with and (suspected) exposure to covid-19: Secondary | ICD-10-CM | POA: Diagnosis not present

## 2021-07-03 DIAGNOSIS — R509 Fever, unspecified: Secondary | ICD-10-CM | POA: Diagnosis not present

## 2021-12-19 ENCOUNTER — Ambulatory Visit
Admission: RE | Admit: 2021-12-19 | Discharge: 2021-12-19 | Disposition: A | Discharge status: Home / Self Care | Payer: Medicare Other | Source: Ambulatory Visit | Attending: Family Medicine | Admitting: Family Medicine

## 2021-12-19 ENCOUNTER — Other Ambulatory Visit: Payer: Self-pay | Admitting: Family Medicine

## 2021-12-19 DIAGNOSIS — Z09 Encounter for follow-up examination after completed treatment for conditions other than malignant neoplasm: Secondary | ICD-10-CM

## 2021-12-19 DIAGNOSIS — M542 Cervicalgia: Secondary | ICD-10-CM

## 2022-01-15 ENCOUNTER — Ambulatory Visit (INDEPENDENT_AMBULATORY_CARE_PROVIDER_SITE_OTHER): Payer: Medicare Other | Admitting: Physical Medicine and Rehabilitation

## 2022-01-15 ENCOUNTER — Encounter: Payer: Self-pay | Admitting: Physical Medicine and Rehabilitation

## 2022-01-15 VITALS — BP 126/73 | HR 43

## 2022-01-15 DIAGNOSIS — M47812 Spondylosis without myelopathy or radiculopathy, cervical region: Secondary | ICD-10-CM

## 2022-01-15 DIAGNOSIS — M542 Cervicalgia: Secondary | ICD-10-CM | POA: Diagnosis not present

## 2022-01-15 NOTE — Progress Notes (Unsigned)
Douglas Hendricks - 66 y.o. male MRN [id number]  Date of birth: 09/17/1955  Office Visit Note: Visit Date: 01/15/2022 PCP: [name], MD Referred by: [name], MD  Subjective: Chief Complaint  Patient presents with   Neck - Pain   Head - Pain   HPI: Douglas Hendricks is a 66 y.o. male who comes in today for evaluation of chronic, worsening and severe left sided neck pain, previously treated patient in 2019. Patient reports pain has been ongoing for several years, worsened over the last several months.  Patient reports pain is exacerbated by movement and activity specifically side-to-side rotation of the neck when swimming.  Reports his pain is most severe in the mornings after waking up.  He describes his pain as a constant sore and aching sensation, currently rates a 7 out of 10.  Patient reports some relief of pain with home exercise regimen, rest and use of medications.  Patient reports history of formal physical therapy/dry needling at [facility], no relief of pain with these treatments. Patients recent cervical MRI exhibits facet arthropathy at C3-C4, C4-C5 and C5-C6. No high grade spinal canal stenosis noted. Patient had left C5-C6 and C6-C7 facet joint/medial branch blocks performed in our office on 10/07/2017 and reports significant and sustained pain relief until recently. Patient states his pain is negatively impacting his daily life. Patient did have both lumbar epidural steroid injection and facet blocks performed in our office in 2016 and reports significant and sustained relief of lower back pain.   Patient denies focal weakness, tingling and numbness. Patient denies recent trauma or falls.    Review of Systems  Musculoskeletal:  Positive for neck pain.  Neurological:  Negative for tingling, sensory change, focal weakness and weakness.  All other systems reviewed and are negative. Otherwise per HPI.  Assessment & Plan: Visit Diagnoses:    ICD-10-CM   1. Cervicalgia   M54.2     2. Neck pain on left side  M54.2 Ambulatory referral to Physical Medicine Rehab    CANCELED: Ambulatory referral to Physical Medicine Rehab    3. Facet arthropathy, cervical  M47.812 Ambulatory referral to Physical Medicine Rehab    CANCELED: Ambulatory referral to Physical Medicine Rehab       Plan: Findings:  Chronic, worsening and severe left sided neck pain. No paraesthesias or radicular symptoms noted. Patient continues to have severe pain despite good conservative therapies such as formal physical therapy, home exercise regimen, rest and use of medications.  Patient's clinical presentation and exam are consistent with facet mediated pain.  He does have significant pain with side-to-side rotation of his neck.  We believe the neck step is to perform diagnostic and hopefully therapeutic left C5-C6 and C6-C7 facet joint/medial branch blocks under fluoroscopic guidance.  If patient gets good relief of pain with diagnostic facet blocks we did discuss the possibility of longer sustained pain relief with radiofrequency ablation procedure. I did provide the patient with educational material regarding radiofrequency ablation to take home and review.  Patient has no questions at this time about injection procedure. No red flag symptoms noted upon exam today.    Meds & Orders: No orders of the defined types were placed in this encounter.   Orders Placed This Encounter  Procedures   Ambulatory referral to Physical Medicine Rehab    Follow-up: Return for Left C5-C6 and C6-C7 facet joint/medial branch blocks .   Procedures: No procedures performed      Clinical History: MRI  CERVICAL SPINE WITHOUT CONTRAST  TECHNIQUE: Multiplanar, multisequence MR imaging of the cervical spine was performed. No intravenous contrast was administered.  COMPARISON: Radiography 12/19/2021  FINDINGS: Alignment: Slightly exaggerated upper cervical lordosis. Mild scoliotic curvature convex to the  right.  Vertebrae: No fracture or focal bone lesion.  Cord: No cord compression or focal cord lesion.  Posterior Fossa, vertebral arteries, paraspinal tissues: Negative  Disc levels:  The foramen magnum is widely patent. There is ordinary mild osteoarthritis of the C1-2 articulation but no encroachment upon the neural structures.  C2-3: Minimal facet degeneration on the left. No canal or foraminal stenosis.  C3-4: Endplate osteophytes and bulging of the disc more prominent towards the left. Facet osteoarthritis on the left. No canal stenosis. Moderate left foraminal narrowing could affect the left C4 nerve.  C4-5: Endplate osteophytes and minimal bulging of the disc towards the left. Facet osteoarthritis on the left. No canal stenosis. Left foraminal stenosis could affect the C5 nerve.  C5-6: Endplate osteophytes and mild bulging of the disc. Facet degeneration on the left. No compressive canal stenosis. Left foraminal stenosis could affect the left C6 nerve. There is a small synovial cyst in a subligamentous location on the left.  C6-7: Endplate osteophytes and bulging of the disc. No compressive canal or foraminal narrowing.  C7-T1: Minimal disc bulge. Mild facet degeneration on the left. Mild left foraminal narrowing.  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  Lesser foraminal narrowing on the left at C7-T1 due to encroachment by facet osteophytes.   Electronically Signed By: Paulina FusiMark Shogry M.D. On: 12/28/2021 16:01   He reports that he has never smoked. He has never used smokeless tobacco. No results for input(s): HGBA1C, LABURIC in the last 8760 hours.  Objective:  VS:  HT:    WT:   BMI:     BP:126/73  HR:(!) 43bpm  TEMP: ( )  RESP:  Physical Exam Vitals and nursing note reviewed.  HENT:     Head: Normocephalic and  atraumatic.     Right Ear: External ear normal.     Left Ear: External ear normal.     Nose: Nose normal.     Mouth/Throat:     Mouth: Mucous membranes are moist.  Eyes:     Extraocular Movements: Extraocular movements intact.  Cardiovascular:     Rate and Rhythm: Normal rate.     Pulses: Normal pulses.  Pulmonary:     Effort: Pulmonary effort is normal.  Abdominal:     General: Abdomen is flat.  Musculoskeletal:        General: Tenderness present.     Cervical back: Tenderness present.     Comments: Discomfort noted with extension and side-to-side rotation. Patient has good strength in the upper extremities including 5 out of 5 strength in wrist extension, long finger flexion and APB.  There is no atrophy of the hands intrinsically.  Sensation intact bilaterally. Negative Hoffman's sign.   Skin:    General: Skin is warm and dry.     Capillary Refill: Capillary refill takes less than 2 seconds.  Neurological:     General: No focal deficit present.     Mental Status: He is alert and oriented to person, place, and time.  Psychiatric:        Mood and Affect: Mood normal.        Behavior: Behavior normal.  Ortho Exam  Imaging: No results found.  Past Medical/Family/Surgical/Social History: Medications & Allergies reviewed per EMR, new medications updated. Patient Active Problem List   Diagnosis Date Noted   Partial tear of left rotator cuff 02/16/2020   Impingement syndrome of left shoulder 02/16/2020   Pain in left shoulder 08/19/2019   Acromioclavicular sprain, left, initial encounter 07/09/2019   Chronic right shoulder pain 08/25/2018   Ventral hernia without obstruction or gangrene 03/18/2014   Past Medical History:  Diagnosis Date   Arthritis    Hernia, inguinal, left 2017   Hyperlipidemia    Thoracic stomach hernia 2015   Family History  Problem Relation Age of Onset   Cancer Mother        pancreatic   COPD Father    Cancer Father        esophageal    Past Surgical History:  Procedure Laterality Date   ABDOMINAL HERNIA REPAIR     hip pain Bilateral    HIP SURGERY Right    INGUINAL HERNIA REPAIR Left    INNER EAR SURGERY Right    KNEE SURGERY Left    SHOULDER SURGERY     TOTAL HIP ARTHROPLASTY     right   TYMPANOSTOMY Right    Social History   Occupational History   Not on file  Tobacco Use   Smoking status: Never   Smokeless tobacco: Never  Vaping Use   Vaping Use: Never used  Substance and Sexual Activity   Alcohol use: Yes    Comment: 1-2   Drug use: No   Sexual activity: Not on file

## 2022-01-15 NOTE — Progress Notes (Unsigned)
Pt state neck pain that the travels up the left side of his head. Pt state laying down at night and getting up out of bed makes th pain worse. Pt state he uses pain cream to help ease his pain.  Numeric Pain Rating Scale and Functional Assessment Average Pain 6 Pain Right Now 2 My pain is intermittent, dull, and aching Pain is worse with:  laying down Pain improves with: heat/ice   In the last MONTH (on 0-10 scale) has pain interfered with the following?  1. General activity like being  able to carry out your everyday physical activities such as walking, climbing stairs, carrying groceries, or moving a chair?  Rating(5)  2. Relation with others like being able to carry out your usual social activities and roles such as  activities at home, at work and in your community. Rating(6)  3. Enjoyment of life such that you have  been bothered by emotional problems such as feeling anxious, depressed or irritable?  Rating(7)

## 2022-02-14 ENCOUNTER — Ambulatory Visit: Payer: Self-pay

## 2022-02-14 ENCOUNTER — Ambulatory Visit (INDEPENDENT_AMBULATORY_CARE_PROVIDER_SITE_OTHER): Payer: Medicare Other | Admitting: Physical Medicine and Rehabilitation

## 2022-02-14 ENCOUNTER — Encounter: Payer: Self-pay | Admitting: Physical Medicine and Rehabilitation

## 2022-02-14 VITALS — BP 131/89 | HR 83

## 2022-02-14 DIAGNOSIS — M47812 Spondylosis without myelopathy or radiculopathy, cervical region: Secondary | ICD-10-CM | POA: Diagnosis not present

## 2022-02-14 MED ORDER — BUPIVACAINE HCL 0.5 % IJ SOLN
3.0000 mL | Freq: Once | INTRAMUSCULAR | Status: AC
  Administered 2022-02-14: 3 mL

## 2022-02-14 NOTE — Progress Notes (Signed)
Pt state neck pain that the travels up the left side of his head. Pt state laying down at night and getting up out of bed makes th pain worse. Pt state he uses pain cream to help ease his pain.  Numeric Pain Rating Scale and Functional Assessment Average Pain 2   In the last MONTH (on 0-10 scale) has pain interfered with the following?  1. General activity like being  able to carry out your everyday physical activities such as walking, climbing stairs, carrying groceries, or moving a chair?  Rating(9)   +Driver, -BT, -Dye Allergies.

## 2022-02-14 NOTE — Patient Instructions (Signed)

## 2022-03-11 NOTE — Procedures (Signed)
Cervical Facet Joint Intra-Articular Injection with Fluoroscopic Guidance  Patient: Douglas Hendricks      Date of Birth: 1955/08/29 MRN: [id number] PCP: [name], MD      Visit Date: 02/14/2022   Universal Protocol:    Date/Time: 07/16/238:29 PM  Consent Given By: the patient  Position: PRONE  Additional Comments: Vital signs were monitored before and after the procedure. Patient was prepped and draped in the usual sterile fashion. The correct patient, procedure, and site was verified.   Injection Procedure Details:  Procedure Site One Meds Administered:  Meds ordered this encounter  Medications   bupivacaine (MARCAINE) 0.5 % (with pres) injection 3 mL     Laterality: Left  Location/Site:  C5-6 C6-7  Needle size: 25 G  Needle type: Spinal  Needle Placement: Articular  Findings:  -Contrast Used: 0.5 mL iohexol 180 mg iodine/mL   -Comments: Excellent flow of contrast producing a partial arthrogram.  Procedure Details: The region overlying the facet joints mentioned above were localized under fluoroscopic visualization. The needle was inserted down to the level of the lateral mass of the superior articular process of the facet joint to be injected. Then, the needle was "walked off" inferiorly into the lateral aspect of the facet joint. Bi-planar images were used for confirming placement and spot radiographs were documented.  A 0.25 ml volume of Omnipaque-240 was injected into the facet joint and a standard partial arthrogram was obtained. Radiographs were obtained of the arthrogram. A 0.5 ml. volume of the steroid/anesthetic solution was injected into the joint. This procedure was repeated for each facet joint injected.   Additional Comments:  The patient tolerated the procedure well Dressing: Band-Aid    Post-procedure details: Patient was observed during the procedure. Post-procedure instructions were reviewed.  Patient left the clinic in stable  condition.

## 2022-03-11 NOTE — Progress Notes (Signed)
Douglas Hendricks - 66 y.o. male MRN [id number]  Date of birth: March 18, 1956  Office Visit Note: Visit Date: 02/14/2022 PCP: [name], MD Referred by: [name], MD  Subjective: Chief Complaint  Patient presents with   Neck - Pain   HPI:  Douglas Hendricks is a 66 y.o. male who comes in today at the request of Douglas Goodie, FNP for planned Left  C5-6 and C6-7 Cervical facet/medial branch block with fluoroscopic guidance.  The patient has failed conservative care including home exercise, medications, time and activity modification.  This injection will be diagnostic and hopefully therapeutic.  Please see requesting physician notes for further details and justification.  Exam has shown concordant pain with facet joint loading.   ROS Otherwise per HPI.  Assessment & Plan: Visit Diagnoses:    ICD-10-CM   1. Cervical spondylosis without myelopathy  M47.812 XR C-ARM NO REPORT    Facet Injection    bupivacaine (MARCAINE) 0.5 % (with pres) injection 3 mL      Plan: No additional findings.   Meds & Orders:  Meds ordered this encounter  Medications   bupivacaine (MARCAINE) 0.5 % (with pres) injection 3 mL    Orders Placed This Encounter  Procedures   Facet Injection   XR C-ARM NO REPORT    Follow-up: Return for visit to requesting provider as needed.   Procedures: No procedures performed  Cervical Facet Joint Intra-Articular Injection with Fluoroscopic Guidance  Patient: Douglas Hendricks      Date of Birth: [birth year] MRN: [id number] PCP: [name], MD      Visit Date: 02/14/2022   Universal Protocol:    Date/Time: 07/16/238:29 PM  Consent Given By: the patient  Position: PRONE  Additional Comments: Vital signs were monitored before and after the procedure. Patient was prepped and draped in the usual sterile fashion. The correct patient, procedure, and site was verified.   Injection Procedure Details:  Procedure Site One Meds Administered:  Meds  ordered this encounter  Medications   bupivacaine (MARCAINE) 0.5 % (with pres) injection 3 mL     Laterality: Left  Location/Site:  C5-6 C6-7  Needle size: 25 G  Needle type: Spinal  Needle Placement: Articular  Findings:  -Contrast Used: 0.5 mL iohexol 180 mg iodine/mL   -Comments: Excellent flow of contrast producing a partial arthrogram.  Procedure Details: The region overlying the facet joints mentioned above were localized under fluoroscopic visualization. The needle was inserted down to the level of the lateral mass of the superior articular process of the facet joint to be injected. Then, the needle was "walked off" inferiorly into the lateral aspect of the facet joint. Bi-planar images were used for confirming placement and spot radiographs were documented.  A 0.25 ml volume of Omnipaque-240 was injected into the facet joint and a standard partial arthrogram was obtained. Radiographs were obtained of the arthrogram. A 0.5 ml. volume of the steroid/anesthetic solution was injected into the joint. This procedure was repeated for each facet joint injected.   Additional Comments:  The patient tolerated the procedure well Dressing: Band-Aid    Post-procedure details: Patient was observed during the procedure. Post-procedure instructions were reviewed.  Patient left the clinic in stable condition.   Clinical History: MRI CERVICAL SPINE WITHOUT CONTRAST  TECHNIQUE: Multiplanar, multisequence MR imaging of the cervical spine was performed. No intravenous contrast was administered.  COMPARISON: Radiography 12/19/2021  FINDINGS: Alignment: Slightly exaggerated upper cervical lordosis. Mild scoliotic curvature convex to the right.  Vertebrae: No fracture or focal bone lesion.  Cord: No cord compression or focal cord lesion.  Posterior Fossa, vertebral arteries, paraspinal tissues: Negative  Disc levels:  The foramen magnum is widely patent. There is ordinary  mild osteoarthritis of the C1-2 articulation but no encroachment upon the neural structures.  C2-3: Minimal facet degeneration on the left. No canal or foraminal stenosis.  C3-4: Endplate osteophytes and bulging of the disc more prominent towards the left. Facet osteoarthritis on the left. No canal stenosis. Moderate left foraminal narrowing could affect the left C4 nerve.  C4-5: Endplate osteophytes and minimal bulging of the disc towards the left. Facet osteoarthritis on the left. No canal stenosis. Left foraminal stenosis could affect the C5 nerve.  C5-6: Endplate osteophytes and mild bulging of the disc. Facet degeneration on the left. No compressive canal stenosis. Left foraminal stenosis could affect the left C6 nerve. There is a small synovial cyst in a subligamentous location on the left.  C6-7: Endplate osteophytes and bulging of the disc. No compressive canal or foraminal narrowing.  C7-T1: Minimal disc bulge. Mild facet degeneration on the left. Mild left foraminal narrowing.  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  Lesser foraminal narrowing on the left at C7-T1 due to encroachment by facet osteophytes.   Electronically Signed By: Paulina Fusi M.D. On: 12/28/2021 16:01     Objective:  VS:  HT:    WT:   BMI:     BP:131/89  HR:83bpm  TEMP: ( )  RESP:  Physical Exam Vitals and nursing note reviewed.  Constitutional:      General: He is not in acute distress.    Appearance: Normal appearance. He is not ill-appearing.  HENT:     Head: Normocephalic and atraumatic.     Right Ear: External ear normal.     Left Ear: External ear normal.  Eyes:     Extraocular Movements: Extraocular movements intact.  Cardiovascular:     Rate and Rhythm: Normal rate.     Pulses: Normal pulses.  Abdominal:     General: There is no  distension.     Palpations: Abdomen is soft.  Musculoskeletal:        General: No signs of injury.     Cervical back: Neck supple. Tenderness present. No rigidity.     Right lower leg: No edema.     Left lower leg: No edema.     Comments: Patient has good strength in the upper extremities with 5 out of 5 strength in wrist extension long finger flexion APB.  No intrinsic hand muscle atrophy.  Negative Hoffmann's test.  Lymphadenopathy:     Cervical: No cervical adenopathy.  Skin:    Findings: No erythema or rash.  Neurological:     General: No focal deficit present.     Mental Status: He is alert and oriented to person, place, and time.     Sensory: No sensory deficit.     Motor: No weakness or abnormal muscle tone.     Coordination: Coordination normal.  Psychiatric:        Mood and Affect: Mood normal.        Behavior: Behavior normal.      Imaging: No results found.

## 2022-06-19 ENCOUNTER — Ambulatory Visit (INDEPENDENT_AMBULATORY_CARE_PROVIDER_SITE_OTHER): Payer: Medicare Other | Admitting: Orthopaedic Surgery

## 2022-06-19 ENCOUNTER — Encounter: Payer: Self-pay | Admitting: Orthopaedic Surgery

## 2022-06-19 ENCOUNTER — Ambulatory Visit (INDEPENDENT_AMBULATORY_CARE_PROVIDER_SITE_OTHER): Payer: Medicare Other

## 2022-06-19 DIAGNOSIS — M25551 Pain in right hip: Secondary | ICD-10-CM

## 2022-06-19 MED ORDER — PREDNISONE 5 MG (21) PO TBPK: ORAL_TABLET | ORAL | 0 refills | Status: DC

## 2022-06-19 NOTE — Progress Notes (Signed)
Office Visit Note   Patient: Douglas Hendricks           Date of Birth: 06-27-1956           MRN: [id number] Visit Date: 06/19/2022              Requested by: [name], MD [address] [place],  [place] [address] PCP: [name], MD   Assessment & Plan: Visit Diagnoses:  1. Pain in right hip     Plan: Mr. Elmquist has a long history of problems referable to both hips.  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  The last was in 2014 at [facility].  A left hip replacement was performed in 2015.  He has been doing well without a problem until about 9 days ago when he experienced onset insidiously of right hip pain.  He is having groin and anterior thigh pain.  No injury or trauma.  No fever or chills.  Seems to be worse when he is inactive or lying down but not as bad when he is up and about.  He certainly has some pain with internal/external rotation of his right hip and none on the left.  X-rays were nondiagnostic.  He certainly could have some early poly wear which is not visible by x-ray.  I am going to try a Medrol Dosepak and then reevaluate in 1 week.  Consider bone scan and lab studies if no improvement  Follow-Up Instructions: Return in about 1 week (around 06/26/2022).   Orders:  Orders Placed This Encounter  Procedures   XR HIP UNILAT W OR W/O PELVIS 2-3 VIEWS RIGHT   Meds ordered this encounter  Medications   predniSONE (STERAPRED UNI-PAK 21 TAB) 5 MG (21) TBPK tablet    Sig: TAKE AS DIRECTED    Dispense:  21 tablet    Refill:  0      Procedures: No procedures performed   Clinical Data: No additional findings.   Subjective: Chief Complaint  Patient presents with   Right Hip - Pain   Insidious onset of right groin pain approximately 9 days ago without history of injury or trauma.  No fever or chills.  Past history is significant for 3 prior surgeries on his right hip  including index surgery of metal-on-metal prosthesis requiring revision on 2 occasions the last of which was in 2014.  Denies any back pain.  Has been taking Tylenol and occasional tramadol for pain.  Pain seems to be worse when he is lying down rather than when he is up on his feet HPI  Review of Systems   Objective: Vital Signs: There were no vitals taken for this visit.  Physical Exam Constitutional:      Appearance: He is well-developed.  Eyes:     Pupils: Pupils are equal, round, and reactive to light.  Pulmonary:     Effort: Pulmonary effort is normal.  Skin:    General: Skin is warm and dry.  Neurological:     Mental Status: He is alert and oriented to person, place, and time.  Psychiatric:        Behavior: Behavior normal.     Ortho Exam awake alert and oriented x3.  Comfortable sitting.  No obvious distress.  He does have some pain with internal/external rotation of his right hip in the groin but no obvious loss of motion.  Does have some pain  with even flexion of his hip.  Neurologically intact.  No edema.  No back pain.  No pain along the lateral aspect of his his hip  Specialty Comments:  MRI CERVICAL SPINE WITHOUT CONTRAST  TECHNIQUE: Multiplanar, multisequence MR imaging of the cervical spine was performed. No intravenous contrast was administered.  COMPARISON: Radiography 12/19/2021  FINDINGS: Alignment: Slightly exaggerated upper cervical lordosis. Mild scoliotic curvature convex to the right.  Vertebrae: No fracture or focal bone lesion.  Cord: No cord compression or focal cord lesion.  Posterior Fossa, vertebral arteries, paraspinal tissues: Negative  Disc levels:  The foramen magnum is widely patent. There is ordinary mild osteoarthritis of the C1-2 articulation but no encroachment upon the neural structures.  C2-3: Minimal facet degeneration on the left. No canal or foraminal stenosis.  C3-4: Endplate osteophytes and bulging of the disc more  prominent towards the left. Facet osteoarthritis on the left. No canal stenosis. Moderate left foraminal narrowing could affect the left C4 nerve.  C4-5: Endplate osteophytes and minimal bulging of the disc towards the left. Facet osteoarthritis on the left. No canal stenosis. Left foraminal stenosis could affect the C5 nerve.  C5-6: Endplate osteophytes and mild bulging of the disc. Facet degeneration on the left. No compressive canal stenosis. Left foraminal stenosis could affect the left C6 nerve. There is a small synovial cyst in a subligamentous location on the left.  C6-7: Endplate osteophytes and bulging of the disc. No compressive canal or foraminal narrowing.  C7-T1: Minimal disc bulge. Mild facet degeneration on the left. Mild left foraminal narrowing.  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  Lesser foraminal narrowing on the left at C7-T1 due to encroachment by facet osteophytes.   Electronically Signed By: Nelson Chimes M.D. On: 12/28/2021 16:01  Imaging: XR HIP UNILAT W OR W/O PELVIS 2-3 VIEWS RIGHT  Result Date: 06/19/2022 AP pelvis and lateral of the right hip were obtained.  There is a revision longstem femoral component without obvious abnormality.  No fracture.  There is no obvious loosening.  Acetabulum appears to be intact without loosening and there is no evidence of obvious poly wear.  There is some ectopic calcification about the greater trochanter but patient is asymptomatic in that area.  No obvious abnormalities of the hip replacement    PMFS History: Patient Active Problem List   Diagnosis Date Noted   Pain in right hip 06/19/2022   Partial tear of left rotator cuff 02/16/2020   Impingement syndrome of left shoulder 02/16/2020   Pain in left shoulder 08/19/2019   Acromioclavicular sprain, left, initial encounter  07/09/2019   Chronic right shoulder pain 08/25/2018   Ventral hernia without obstruction or gangrene 03/18/2014   Past Medical History:  Diagnosis Date   Arthritis    Hernia, inguinal, left 2017   Hyperlipidemia    Thoracic stomach hernia 2015    Family History  Problem Relation Age of Onset   Cancer Mother        pancreatic   COPD Father    Cancer Father        esophageal    Past Surgical History:  Procedure Laterality Date   ABDOMINAL HERNIA REPAIR     hip pain Bilateral    HIP SURGERY Right    INGUINAL HERNIA REPAIR Left    INNER EAR SURGERY Right    KNEE SURGERY Left  SHOULDER SURGERY     TOTAL HIP ARTHROPLASTY     right   TYMPANOSTOMY Right    Social History   Occupational History   Not on file  Tobacco Use   Smoking status: Never   Smokeless tobacco: Never  Vaping Use   Vaping Use: Never used  Substance and Sexual Activity   Alcohol use: Yes    Comment: 1-2   Drug use: No   Sexual activity: Not on file     Valeria Batman, MD   Note - This record has been created using Animal nutritionist.  Chart creation errors have been sought, but may not always  have been located. Such creation errors do not reflect on  the standard of medical care.

## 2022-06-26 ENCOUNTER — Ambulatory Visit (INDEPENDENT_AMBULATORY_CARE_PROVIDER_SITE_OTHER): Payer: Medicare Other | Admitting: Orthopaedic Surgery

## 2022-06-26 ENCOUNTER — Encounter: Payer: Self-pay | Admitting: Orthopaedic Surgery

## 2022-06-26 ENCOUNTER — Other Ambulatory Visit: Payer: Self-pay | Admitting: Physician Assistant

## 2022-06-26 DIAGNOSIS — Z96641 Presence of right artificial hip joint: Secondary | ICD-10-CM

## 2022-06-26 DIAGNOSIS — M25551 Pain in right hip: Secondary | ICD-10-CM

## 2022-06-26 MED ORDER — PREDNISONE 5 MG (21) PO TBPK: ORAL_TABLET | ORAL | 0 refills | Status: DC

## 2022-06-26 MED ORDER — TRAMADOL HCL 50 MG PO TABS: 100.0000 mg | ORAL_TABLET | Freq: Three times a day (TID) | ORAL | 0 refills | Status: DC

## 2022-06-26 NOTE — Progress Notes (Signed)
Office Visit Note   Patient: Douglas Hendricks           Date of Birth: April 28, 1956           MRN: [id number] Visit Date: 06/26/2022              Requested by: Lawerance Cruel, [place],  [place] [address] PCP: Lawerance Cruel, MD   Assessment & Plan: Visit Diagnoses:  1. History of right hip replacement   2. Pain in right hip     Plan: Mr. Douglas Hendricks is a pleasant 66 year old gentleman who follows up on his right hip pain.  He has a history of a right hip replacement and had been doing very well until a few weeks ago when he had insidious onset of right hip pain.  He describes this is being in the groin.  Denied any radicular symptoms.  He was placed on a Medrol Dosepak last week.  He did say it gave him good relief though not completely relief.  He did try going for a bicycle ride when he was better and the next day had increased symptoms in the right hip.  We discussed possible avenues of treatment at this point.  He is considering some physical therapy to see if this is muscular which certainly could be.  Given he has had a hip replacement cannot rule out loosening of the components.  He does have an extensive bike ride planned in [place] for the spring.  Given his plans and symptoms we will go forward to eliminate any possibility of loosening or wear in the component with a three-phase bone scan.  If the bone scan does not demonstrate any loosening he can consider physical therapy.  In the meantime he is planning a trip and would like to try 1 more steroid Dosepak.  He did not have any ill effects from the first 1.  He notices he has a lot of trouble especially at night.  We discussed taking tramadol as needed and have given him a refill of this as well.  Follow-Up Instructions: Return After bone scan.   Orders:  Orders Placed This Encounter  Procedures   NM Bone Scan 3 Phase   No orders of the defined types were placed in this encounter.     Procedures: No  procedures performed   Clinical Data: No additional findings.   Subjective: Chief Complaint  Patient presents with   Right Hip - Follow-up    HPI Mr. Douglas Hendricks is a pleasant 66 year old active gentleman who has history of right hip replacement done in [place].  He was doing quite well until a few weeks ago when he had insidious onset of right hip groin pain.  Denied any radicular pain.  We did place him on a steroid Dosepak last week which she said was helpful reducing the pain.  He did try to return to his cycling and had immediate return of his symptoms.  He is concerned that he has a extensive bike trip planned for the spring through [place] and wants to be sure that he is not doing any further harm to his hip  Review of Systems  All other systems reviewed and are negative.    Objective: Vital Signs: There were no vitals taken for this visit.  Physical Exam Constitutional:      Appearance: Normal appearance.  Pulmonary:     Effort: Pulmonary effort is normal.  Neurological:     General: No focal  deficit present.     Mental Status: He is alert.     Ortho Exam Examination of his right hip pain is focused in the groin with some increase with internal/external rotation.  His distal strength is 5 out of 5 he is neurovascularly intact.  No buttock pain no lower back pain Specialty Comments:  MRI CERVICAL SPINE WITHOUT CONTRAST  TECHNIQUE: Multiplanar, multisequence MR imaging of the cervical spine was performed. No intravenous contrast was administered.  COMPARISON: Radiography 12/19/2021  FINDINGS: Alignment: Slightly exaggerated upper cervical lordosis. Mild scoliotic curvature convex to the right.  Vertebrae: No fracture or focal bone lesion.  Cord: No cord compression or focal cord lesion.  Posterior Fossa, vertebral arteries, paraspinal tissues: Negative  Disc levels:  The foramen magnum is widely patent. There is ordinary mild osteoarthritis of the C1-2  articulation but no encroachment upon the neural structures.  C2-3: Minimal facet degeneration on the left. No canal or foraminal stenosis.  C3-4: Endplate osteophytes and bulging of the disc more prominent towards the left. Facet osteoarthritis on the left. No canal stenosis. Moderate left foraminal narrowing could affect the left C4 nerve.  C4-5: Endplate osteophytes and minimal bulging of the disc towards the left. Facet osteoarthritis on the left. No canal stenosis. Left foraminal stenosis could affect the C5 nerve.  C5-6: Endplate osteophytes and mild bulging of the disc. Facet degeneration on the left. No compressive canal stenosis. Left foraminal stenosis could affect the left C6 nerve. There is a small synovial cyst in a subligamentous location on the left.  C6-7: Endplate osteophytes and bulging of the disc. No compressive canal or foraminal narrowing.  C7-T1: Minimal disc bulge. Mild facet degeneration on the left. Mild left foraminal narrowing.  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  Lesser foraminal narrowing on the left at C7-T1 due to encroachment by facet osteophytes.   Electronically Signed By: Nelson Chimes M.D. On: 12/28/2021 16:01  Imaging: No results found.   PMFS History: Patient Active Problem List   Diagnosis Date Noted   Pain in right hip 06/19/2022   Partial tear of left rotator cuff 02/16/2020   Impingement syndrome of left shoulder 02/16/2020   Pain in left shoulder 08/19/2019   Acromioclavicular sprain, left, initial encounter 07/09/2019   Chronic right shoulder pain 08/25/2018   Ventral hernia without obstruction or gangrene 03/18/2014   Past Medical History:  Diagnosis Date   Arthritis    Hernia, inguinal, left 2017   Hyperlipidemia    Thoracic stomach hernia 2015    Family History  Problem  Relation Age of Onset   Cancer Mother        pancreatic   COPD Father    Cancer Father        esophageal    Past Surgical History:  Procedure Laterality Date   ABDOMINAL HERNIA REPAIR     hip pain Bilateral    HIP SURGERY Right    INGUINAL HERNIA REPAIR Left    INNER EAR SURGERY Right    KNEE SURGERY Left    SHOULDER SURGERY     TOTAL HIP ARTHROPLASTY     right   TYMPANOSTOMY Right    Social History   Occupational History   Not on file  Tobacco Use   Smoking status: Never   Smokeless tobacco: Never  Vaping Use   Vaping Use: Never used  Substance and  Sexual Activity   Alcohol use: Yes    Comment: 1-2   Drug use: No   Sexual activity: Not on file

## 2022-07-05 ENCOUNTER — Encounter (HOSPITAL_COMMUNITY)
Admission: RE | Admit: 2022-07-05 | Discharge: 2022-07-05 | Disposition: A | Discharge status: Home / Self Care | Payer: Medicare Other | Source: Ambulatory Visit | Attending: Orthopaedic Surgery | Admitting: Orthopaedic Surgery

## 2022-07-05 DIAGNOSIS — Z96641 Presence of right artificial hip joint: Secondary | ICD-10-CM | POA: Insufficient documentation

## 2022-07-05 MED ORDER — TECHNETIUM TC 99M MEDRONATE IV KIT
20.0000 | PACK | Freq: Once | INTRAVENOUS | Status: AC | PRN
  Administered 2022-07-05: 20 via INTRAVENOUS

## 2022-07-10 ENCOUNTER — Encounter: Payer: Self-pay | Admitting: Orthopaedic Surgery

## 2022-07-11 ENCOUNTER — Encounter: Payer: Self-pay | Admitting: Orthopaedic Surgery

## 2022-07-11 ENCOUNTER — Ambulatory Visit (INDEPENDENT_AMBULATORY_CARE_PROVIDER_SITE_OTHER): Payer: Medicare Other | Admitting: Orthopaedic Surgery

## 2022-07-11 DIAGNOSIS — M25551 Pain in right hip: Secondary | ICD-10-CM | POA: Diagnosis not present

## 2022-07-11 NOTE — Progress Notes (Signed)
Office Visit Note   Patient: Douglas Hendricks           Date of Birth: 1955-12-30           MRN: [id number] Visit Date: 07/11/2022              Requested by: Lawerance Cruel, [place],  [place] [address] PCP: Lawerance Cruel, MD   Assessment & Plan: Visit Diagnoses:  1. Pain in right hip     Plan: Mr. Million returns for reevaluation of his right hip pain.  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  He thinks he may have a ceramic hip ball.  He has been having some trouble now as per the previous notes for nearly a month.  The pain was somewhat insidious in onset without obvious injury or trauma.  His x-rays were nondiagnostic.  I ordered a three-phase bone scan and there was a nonspecific third phase increase around the proximal femur but not about the acetabulum or femoral stem.  They did not think this was consistent with either infection or loosening.  He still having some groin pain and occasional thigh discomfort it seems to be worse after increased activity.  There is no evidence of obvious poly wear.  I am going to order a CBC sed rate C-reactive protein and a c-Met.  Also have right hip aspiration performed and send the fluid if any for cell, cell count as well as culture and sensitivity.  Follow-Up Instructions: Return in about 2 weeks (around 07/25/2022).   Orders:  Orders Placed This Encounter  Procedures   CBC with Differential/Platelet   Sedimentation rate   C-reactive protein   Comprehensive Metabolic Panel (CMET)   AMB referral to sports medicine   No orders of the defined types were placed in this encounter.     Procedures: No procedures performed   Clinical Data: No additional findings.   Subjective: Chief Complaint  Patient presents with   Right Hip - Follow-up    Bone scan review  Patient presents  today for follow up on his right hip. He had a bone scan and is here today to review those results.  HPI  Review of Systems   Objective: Vital Signs: There were no vitals taken for this visit.  Physical Exam Constitutional:      Appearance: He is well-developed.  Eyes:     Pupils: Pupils are equal, round, and reactive to light.  Pulmonary:     Effort: Pulmonary effort is normal.  Skin:    General: Skin is warm and dry.  Neurological:     Mental Status: He is alert and oriented to person, place, and time.  Psychiatric:        Behavior: Behavior normal.     Ortho Exam awake alert and oriented x3.  Comfortable sitting.  Is not walking with an ambulatory gait no using any ambulatory aid.  He had a little discomfort on the extreme of internal/external rotation.  No obvious loss of hip motion.  Straight leg raise negative.  No low back pain.  No thigh discomfort. no knee pain  Specialty Comments:  MRI CERVICAL SPINE WITHOUT CONTRAST  TECHNIQUE: Multiplanar, multisequence MR imaging of the cervical spine was performed. No intravenous contrast was administered.  COMPARISON: Radiography 12/19/2021  FINDINGS: Alignment: Slightly exaggerated upper cervical lordosis.  Mild scoliotic curvature convex to the right.  Vertebrae: No fracture or focal bone lesion.  Cord: No cord compression or focal cord lesion.  Posterior Fossa, vertebral arteries, paraspinal tissues: Negative  Disc levels:  The foramen magnum is widely patent. There is ordinary mild osteoarthritis of the C1-2 articulation but no encroachment upon the neural structures.  C2-3: Minimal facet degeneration on the left. No canal or foraminal stenosis.  C3-4: Endplate osteophytes and bulging of the disc more prominent towards the left. Facet osteoarthritis on the left. No canal stenosis. Moderate left foraminal narrowing could affect the left C4 nerve.  C4-5: Endplate osteophytes and minimal bulging of the disc  towards the left. Facet osteoarthritis on the left. No canal stenosis. Left foraminal stenosis could affect the C5 nerve.  C5-6: Endplate osteophytes and mild bulging of the disc. Facet degeneration on the left. No compressive canal stenosis. Left foraminal stenosis could affect the left C6 nerve. There is a small synovial cyst in a subligamentous location on the left.  C6-7: Endplate osteophytes and bulging of the disc. No compressive canal or foraminal narrowing.  C7-T1: Minimal disc bulge. Mild facet degeneration on the left. Mild left foraminal narrowing.  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  Lesser foraminal narrowing on the left at C7-T1 due to encroachment by facet osteophytes.   Electronically Signed By: Nelson Chimes M.D. On: 12/28/2021 16:01  Imaging: No results found.   PMFS History: Patient Active Problem List   Diagnosis Date Noted   Pain in right hip 06/19/2022   Partial tear of left rotator cuff 02/16/2020   Impingement syndrome of left shoulder 02/16/2020   Pain in left shoulder 08/19/2019   Acromioclavicular sprain, left, initial encounter 07/09/2019   Chronic right shoulder pain 08/25/2018   Ventral hernia without obstruction or gangrene 03/18/2014   Past Medical History:  Diagnosis Date   Arthritis    Hernia, inguinal, left 2017   Hyperlipidemia    Thoracic stomach hernia 2015    Family History  Problem Relation Age of Onset   Cancer Mother        pancreatic   COPD Father    Cancer Father        esophageal    Past Surgical History:  Procedure Laterality Date   ABDOMINAL HERNIA REPAIR     hip pain Bilateral    HIP SURGERY Right    INGUINAL HERNIA REPAIR Left    INNER EAR SURGERY Right    KNEE SURGERY Left    SHOULDER SURGERY     TOTAL HIP ARTHROPLASTY     right   TYMPANOSTOMY Right    Social  History   Occupational History   Not on file  Tobacco Use   Smoking status: Never   Smokeless tobacco: Never  Vaping Use   Vaping Use: Never used  Substance and Sexual Activity   Alcohol use: Yes    Comment: 1-2   Drug use: No   Sexual activity: Not on file

## 2022-07-12 LAB — COMPREHENSIVE METABOLIC PANEL
AG Ratio: 1.6 (calc) (ref 1.0–2.5)
ALT: 25 U/L (ref 9–46)
AST: 22 U/L (ref 10–35)
Albumin: 4.1 g/dL (ref 3.6–5.1)
Alkaline phosphatase (APISO): 57 U/L (ref 35–144)
BUN: 15 mg/dL (ref 7–25)
CO2: 27 mmol/L (ref 20–32)
Calcium: 9.6 mg/dL (ref 8.6–10.3)
Chloride: 106 mmol/L (ref 98–110)
Creat: 0.93 mg/dL (ref 0.70–1.35)
Globulin: 2.6 g/dL (calc) (ref 1.9–3.7)
Glucose, Bld: 99 mg/dL (ref 65–99)
Potassium: 4.3 mmol/L (ref 3.5–5.3)
Sodium: 140 mmol/L (ref 135–146)
Total Bilirubin: 0.7 mg/dL (ref 0.2–1.2)
Total Protein: 6.7 g/dL (ref 6.1–8.1)

## 2022-07-12 LAB — CBC WITH DIFFERENTIAL/PLATELET
Absolute Monocytes: 968 cells/uL — ABNORMAL HIGH (ref 200–950)
Basophils Absolute: 57 cells/uL (ref 0–200)
Basophils Relative: 0.7 %
Eosinophils Absolute: 172 cells/uL (ref 15–500)
Eosinophils Relative: 2.1 %
HCT: 40.4 % (ref 38.5–50.0)
Hemoglobin: 14.1 g/dL (ref 13.2–17.1)
Lymphs Abs: 976 cells/uL (ref 850–3900)
MCH: 32 pg (ref 27.0–33.0)
MCHC: 34.9 g/dL (ref 32.0–36.0)
MCV: 91.6 fL (ref 80.0–100.0)
MPV: 11 fL (ref 7.5–12.5)
Monocytes Relative: 11.8 %
Neutro Abs: 6027 cells/uL (ref 1500–7800)
Neutrophils Relative %: 73.5 %
Platelets: 230 10*3/uL (ref 140–400)
RBC: 4.41 10*6/uL (ref 4.20–5.80)
RDW: 12.8 % (ref 11.0–15.0)
Total Lymphocyte: 11.9 %
WBC: 8.2 10*3/uL (ref 3.8–10.8)

## 2022-07-12 LAB — C-REACTIVE PROTEIN: CRP: 1.7 mg/L (ref <8.0)

## 2022-07-12 LAB — SEDIMENTATION RATE: Sed Rate: 9 mm/h (ref 0–20)

## 2022-07-13 ENCOUNTER — Encounter: Payer: Self-pay | Admitting: Sports Medicine

## 2022-07-13 ENCOUNTER — Ambulatory Visit (INDEPENDENT_AMBULATORY_CARE_PROVIDER_SITE_OTHER): Payer: Medicare Other | Admitting: Sports Medicine

## 2022-07-13 ENCOUNTER — Ambulatory Visit: Payer: Medicare Other

## 2022-07-13 DIAGNOSIS — M25551 Pain in right hip: Secondary | ICD-10-CM | POA: Diagnosis not present

## 2022-07-13 MED ORDER — LIDOCAINE HCL 1 % IJ SOLN
6.0000 mL | INTRAMUSCULAR | Status: AC | PRN
  Administered 2022-07-13: 6 mL

## 2022-07-13 NOTE — Progress Notes (Signed)
   Procedure Note  Patient: Douglas Hendricks             Date of Birth: 10-10-1955           MRN: [id number]             Visit Date: 07/13/2022  Procedures: Visit Diagnoses:  1. Pain in right hip    Large Joint Inj: R hip joint on 07/13/2022 9:49 AM Indications: pain Details: 18 G 3.5 in needle, ultrasound-guided anterior approach Medications: 6 mL lidocaine 1 % Aspirate: 0 mL Outcome: tolerated well, no immediate complications  Procedure: US-guided intra-articular hip aspiration, right After discussion on risks/benefits/indications and informed verbal consent was obtained, a timeout was performed. Patient was lying supine on exam table. The hip was cleaned with chloraprep and alcohol swabs. Then utilizing ultrasound guidance, the patient's femoral head and neck junction of the hip prosthesis was identified.  Using a 22-gauge, 3.5 inch needle the tract was anesthetized with 6 cc of lidocaine down to the hip joint.  Following appropriate numbing, this was removed and an 18-gauge, 3.5 inch needle was inserted around the hip prosthesis and multiple passes aspiration was attempted without fluid yield.  The syringe was switched under sterile precautions and 3 cc of normal saline was pushed through which did not attempt to get some flow back.  Aspiration was attempted in multiple locations around the hip prosthesis without any nail deformity.  The needle was removed and pressure was applied for a few minutes before a Band-Aid was applied on the overlying skin. Patient tolerated procedure well without immediate complications.  IMPRESSION: Dry-tap of attempted hip aspiration with prosthesis  Procedure, treatment alternatives, risks and benefits explained, specific risks discussed. Consent was given by the patient. Immediately prior to procedure a time out was called to verify the correct patient, procedure, equipment, support staff and site/side marked as required. Patient was prepped and draped in the  usual sterile fashion.     - Did discuss with the patient briefly that given his normal labs, relatively normal bone scan and dry tap attempted aspiration that infection or loosening is much less likely  - Patient will follow-up with Dr. [name] to discuss next steps  Madelyn Brunner, DO Primary Care Sports Medicine Physician  [facility] - Orthopedics  This note was dictated using Dragon naturally speaking software and may contain errors in syntax, spelling, or content which have not been identified prior to signing this note.

## 2022-07-17 ENCOUNTER — Other Ambulatory Visit: Payer: Self-pay

## 2022-07-17 ENCOUNTER — Encounter: Payer: Self-pay | Admitting: Orthopaedic Surgery

## 2022-07-17 DIAGNOSIS — M25551 Pain in right hip: Secondary | ICD-10-CM

## 2022-07-27 ENCOUNTER — Other Ambulatory Visit: Payer: Self-pay | Admitting: Physician Assistant

## 2022-07-27 DIAGNOSIS — M5416 Radiculopathy, lumbar region: Secondary | ICD-10-CM

## 2022-07-30 LAB — COMPREHENSIVE METABOLIC PANEL: EGFR: 81

## 2022-08-09 ENCOUNTER — Ambulatory Visit
Admission: RE | Admit: 2022-08-09 | Discharge: 2022-08-09 | Disposition: A | Discharge status: Home / Self Care | Payer: Medicare Other | Source: Ambulatory Visit | Attending: Orthopaedic Surgery | Admitting: Orthopaedic Surgery

## 2022-08-09 DIAGNOSIS — M25551 Pain in right hip: Secondary | ICD-10-CM

## 2022-08-10 ENCOUNTER — Ambulatory Visit
Admission: RE | Admit: 2022-08-10 | Discharge: 2022-08-10 | Disposition: A | Discharge status: Home / Self Care | Payer: Medicare Other | Source: Ambulatory Visit | Attending: Physician Assistant | Admitting: Physician Assistant

## 2022-08-10 DIAGNOSIS — M5416 Radiculopathy, lumbar region: Secondary | ICD-10-CM

## 2022-08-14 NOTE — Progress Notes (Signed)
Discussed results with Mr [facility] leave a set of L-S spine exercises for him at the front desk-thanks

## 2022-08-15 ENCOUNTER — Ambulatory Visit: Payer: Medicare Other | Admitting: Orthopaedic Surgery

## 2022-10-01 DIAGNOSIS — M79641 Pain in right hand: Secondary | ICD-10-CM | POA: Diagnosis not present

## 2022-10-01 DIAGNOSIS — M2540 Effusion, unspecified joint: Secondary | ICD-10-CM | POA: Diagnosis not present

## 2022-10-01 DIAGNOSIS — R29898 Other symptoms and signs involving the musculoskeletal system: Secondary | ICD-10-CM | POA: Diagnosis not present

## 2022-10-01 DIAGNOSIS — M79642 Pain in left hand: Secondary | ICD-10-CM | POA: Diagnosis not present

## 2022-10-04 DIAGNOSIS — R3912 Poor urinary stream: Secondary | ICD-10-CM | POA: Diagnosis not present

## 2022-10-04 DIAGNOSIS — R3915 Urgency of urination: Secondary | ICD-10-CM | POA: Diagnosis not present

## 2022-10-09 DIAGNOSIS — R1030 Lower abdominal pain, unspecified: Secondary | ICD-10-CM | POA: Diagnosis not present

## 2022-11-20 DIAGNOSIS — L259 Unspecified contact dermatitis, unspecified cause: Secondary | ICD-10-CM | POA: Diagnosis not present

## 2022-12-12 DIAGNOSIS — R3912 Poor urinary stream: Secondary | ICD-10-CM | POA: Diagnosis not present

## 2022-12-12 DIAGNOSIS — M62838 Other muscle spasm: Secondary | ICD-10-CM | POA: Diagnosis not present

## 2022-12-12 DIAGNOSIS — M6289 Other specified disorders of muscle: Secondary | ICD-10-CM | POA: Diagnosis not present

## 2022-12-12 DIAGNOSIS — M6281 Muscle weakness (generalized): Secondary | ICD-10-CM | POA: Diagnosis not present

## 2022-12-19 ENCOUNTER — Ambulatory Visit: Payer: Medicare Other | Admitting: Orthopaedic Surgery

## 2022-12-19 ENCOUNTER — Ambulatory Visit (INDEPENDENT_AMBULATORY_CARE_PROVIDER_SITE_OTHER): Payer: Medicare Other

## 2022-12-19 ENCOUNTER — Other Ambulatory Visit: Payer: Self-pay

## 2022-12-19 ENCOUNTER — Encounter: Payer: Self-pay | Admitting: Orthopaedic Surgery

## 2022-12-19 ENCOUNTER — Ambulatory Visit (INDEPENDENT_AMBULATORY_CARE_PROVIDER_SITE_OTHER): Payer: Medicare Other | Admitting: Sports Medicine

## 2022-12-19 DIAGNOSIS — M25561 Pain in right knee: Secondary | ICD-10-CM | POA: Diagnosis not present

## 2022-12-19 DIAGNOSIS — G8929 Other chronic pain: Secondary | ICD-10-CM

## 2022-12-19 DIAGNOSIS — M25512 Pain in left shoulder: Secondary | ICD-10-CM | POA: Diagnosis not present

## 2022-12-19 MED ORDER — METHYLPREDNISOLONE ACETATE 40 MG/ML IJ SUSP
40.0000 mg | INTRAMUSCULAR | Status: AC | PRN
  Administered 2022-12-19: 40 mg via INTRA_ARTICULAR

## 2022-12-19 MED ORDER — METHYLPREDNISOLONE ACETATE 40 MG/ML IJ SUSP
80.0000 mg | INTRAMUSCULAR | Status: AC | PRN
  Administered 2022-12-19: 80 mg via INTRA_ARTICULAR

## 2022-12-19 MED ORDER — BUPIVACAINE HCL 0.5 % IJ SOLN
2.0000 mL | INTRAMUSCULAR | Status: AC | PRN
  Administered 2022-12-19: 2 mL via INTRA_ARTICULAR

## 2022-12-19 MED ORDER — BUPIVACAINE HCL 0.25 % IJ SOLN
2.0000 mL | INTRAMUSCULAR | Status: AC | PRN
  Administered 2022-12-19: 2 mL via INTRA_ARTICULAR

## 2022-12-19 MED ORDER — LIDOCAINE HCL 1 % IJ SOLN
2.0000 mL | INTRAMUSCULAR | Status: AC | PRN
  Administered 2022-12-19: 2 mL

## 2022-12-19 NOTE — Progress Notes (Signed)
   Procedure Note  Patient: Leory Allinson             Date of Birth: May 26, 1956           MRN: [id number]             Visit Date: 12/19/2022  Procedures: Visit Diagnoses:  1. Chronic left shoulder pain    Large Joint Inj: L glenohumeral on 12/19/2022 10:17 AM Indications: pain Details: 22 G 3.5 in needle, ultrasound-guided posterior approach Medications: 2 mL lidocaine 1 %; 2 mL bupivacaine 0.25 %; 80 mg methylPREDNISolone acetate 40 MG/ML Outcome: tolerated well, no immediate complications Procedure, treatment alternatives, risks and benefits explained, specific risks discussed. Consent was given by the patient. Immediately prior to procedure a time out was called to verify the correct patient, procedure, equipment, support staff and site/side marked as required. Patient was prepped and draped in the usual sterile fashion.    - I evaluated the patient about 10 minutes post-injection and he had improvement in pain and range of motion - follow-up with Dr. Roda Shutters as indicated; I am happy to see them as needed  Madelyn Brunner, DO Primary Care Sports Medicine Physician  [facility] - Orthopedics  This note was dictated using Dragon naturally speaking software and may contain errors in syntax, spelling, or content which have not been identified prior to signing this note.

## 2022-12-19 NOTE — Progress Notes (Signed)
Office Visit Note   Patient: Douglas Hendricks           Date of Birth: [birth year]           MRN: [id number] Visit Date: 12/19/2022              Requested by: Douglas Floro, MD [address] [place],  [place] [address] PCP: Douglas Floro, MD   Assessment & Plan: Visit Diagnoses:  1. Chronic pain of right knee   2. Chronic left shoulder pain     Plan: Impression is 67 year old gentleman with right knee pain and left shoulder pain.  In regards to the left shoulder impression is partial rotator cuff tear and probable labral tear.  This is likely due to overactivity and overuse.  Strength and function of the shoulder overall well-preserved.  I would like to do an ultrasound-guided intra-articular injection for the shoulder.  We will send him to Dr. Shon Baton.  For the right knee impression is flareup of right patellofemoral arthritis.  Not reporting any mechanical symptoms.  Steroid injection performed today.  Will use knee brace and Voltaren gel and modify activity as needed.  Follow-Up Instructions: No follow-ups on file.   Orders:  Orders Placed This Encounter  Procedures   Large Joint Inj: R knee   XR KNEE 3 VIEW RIGHT   XR Shoulder Left   No orders of the defined types were placed in this encounter.     Procedures: Large Joint Inj: R knee on 12/19/2022 9:46 AM Indications: pain Details: 22 G needle  Arthrogram: No  Medications: 40 mg methylPREDNISolone acetate 40 MG/ML; 2 mL lidocaine 1 %; 2 mL bupivacaine 0.5 % Consent was given by the patient. Patient was prepped and draped in the usual sterile fashion.       Clinical Data: No additional findings.   Subjective: Chief Complaint  Patient presents with   Right Knee - Pain   Left Shoulder - Pain    HPI  Patient is a very pleasant 67 year old gentleman here for evaluation of right knee pain and left shoulder pain.  He has had left shoulder pain for some time and was a former patient of Dr. Cleophas Hendricks.  He  had 2 subacromial injections in the past with good relief.  He also found relief in swimming but his pain has now worsened and swimming no longer helps with the pain.  With the right knee he describes lateral pain.  Denies any mechanical symptoms or swelling.  Denies any trauma or injuries.  Review of Systems  Constitutional: Negative.   HENT: Negative.    Eyes: Negative.   Respiratory: Negative.    Cardiovascular: Negative.   Gastrointestinal: Negative.   Endocrine: Negative.   Genitourinary: Negative.   Skin: Negative.   Allergic/Immunologic: Negative.   Neurological: Negative.   Hematological: Negative.   Psychiatric/Behavioral: Negative.    All other systems reviewed and are negative.    Objective: Vital Signs: There were no vitals taken for this visit.  Physical Exam Vitals and nursing note reviewed.  Constitutional:      Appearance: He is well-developed.  HENT:     Head: Normocephalic and atraumatic.  Eyes:     Pupils: Pupils are equal, round, and reactive to light.  Pulmonary:     Effort: Pulmonary effort is normal.  Abdominal:     Palpations: Abdomen is soft.  Musculoskeletal:        General: Normal range of motion.     Cervical  back: Neck supple.  Skin:    General: Skin is warm.  Neurological:     Mental Status: He is alert and oriented to person, place, and time.  Psychiatric:        Behavior: Behavior normal.        Thought Content: Thought content normal.        Judgment: Judgment normal.     Ortho Exam  Examination left shoulder shows full passive and active range of motion without significant pain.  Manual muscle testing of the rotator cuff shows good strength to supraspinatus and a 4 out of 5 strength with infraspinatus.  He has pain with Speed test and pain with O'Brien.  Negative crank test.  Examination right knee shows no joint effusion.  Tenderness along the lateral patellofemoral facet.  No joint line tenderness.  Collaterals and cruciates are  stable.  Specialty Comments:  MRI CERVICAL SPINE WITHOUT CONTRAST  TECHNIQUE: Multiplanar, multisequence MR imaging of the cervical spine was performed. No intravenous contrast was administered.  COMPARISON: Radiography 12/19/2021  FINDINGS: Alignment: Slightly exaggerated upper cervical lordosis. Mild scoliotic curvature convex to the right.  Vertebrae: No fracture or focal bone lesion.  Cord: No cord compression or focal cord lesion.  Posterior Fossa, vertebral arteries, paraspinal tissues: Negative  Disc levels:  The foramen magnum is widely patent. There is ordinary mild osteoarthritis of the C1-2 articulation but no encroachment upon the neural structures.  C2-3: Minimal facet degeneration on the left. No canal or foraminal stenosis.  C3-4: Endplate osteophytes and bulging of the disc more prominent towards the left. Facet osteoarthritis on the left. No canal stenosis. Moderate left foraminal narrowing could affect the left C4 nerve.  C4-5: Endplate osteophytes and minimal bulging of the disc towards the left. Facet osteoarthritis on the left. No canal stenosis. Left foraminal stenosis could affect the C5 nerve.  C5-6: Endplate osteophytes and mild bulging of the disc. Facet degeneration on the left. No compressive canal stenosis. Left foraminal stenosis could affect the left C6 nerve. There is a small synovial cyst in a subligamentous location on the left.  C6-7: Endplate osteophytes and bulging of the disc. No compressive canal or foraminal narrowing.  C7-T1: Minimal disc bulge. Mild facet degeneration on the left. Mild left foraminal narrowing.  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  Lesser foraminal narrowing on the left at C7-T1 due to encroachment by facet osteophytes.   Electronically Signed By: Paulina Fusi  M.D. On: 12/28/2021 16:01  Imaging: XR KNEE 3 VIEW RIGHT  Result Date: 12/19/2022 X-rays demonstrate mild patellofemoral spurring  XR Shoulder Left  Result Date: 12/19/2022 X-rays demonstrate mild AC joint arthritis.  Otherwise unremarkable.    PMFS History: Patient Active Problem List   Diagnosis Date Noted   Pain in right hip 06/19/2022   Partial tear of left rotator cuff 02/16/2020   Impingement syndrome of left shoulder 02/16/2020   Pain in left shoulder 08/19/2019   Acromioclavicular sprain, left, initial encounter 07/09/2019   Chronic right shoulder pain 08/25/2018   Ventral hernia without obstruction or gangrene 03/18/2014   Past Medical History:  Diagnosis Date   Arthritis    Hernia, inguinal, left 2017   Hyperlipidemia    Thoracic stomach hernia 2015    Family History  Problem Relation Age of Onset   Cancer Mother        pancreatic  COPD Father    Cancer Father        esophageal    Past Surgical History:  Procedure Laterality Date   ABDOMINAL HERNIA REPAIR     hip pain Bilateral    HIP SURGERY Right    INGUINAL HERNIA REPAIR Left    INNER EAR SURGERY Right    KNEE SURGERY Left    SHOULDER SURGERY     TOTAL HIP ARTHROPLASTY     right   TYMPANOSTOMY Right    Social History   Occupational History   Not on file  Tobacco Use   Smoking status: Never   Smokeless tobacco: Never  Vaping Use   Vaping Use: Never used  Substance and Sexual Activity   Alcohol use: Yes    Comment: 1-2   Drug use: No   Sexual activity: Not on file

## 2023-01-16 DIAGNOSIS — E7800 Pure hypercholesterolemia, unspecified: Secondary | ICD-10-CM | POA: Diagnosis not present

## 2023-01-16 DIAGNOSIS — Z131 Encounter for screening for diabetes mellitus: Secondary | ICD-10-CM | POA: Diagnosis not present

## 2023-01-23 DIAGNOSIS — Z0000 Encounter for general adult medical examination without abnormal findings: Secondary | ICD-10-CM | POA: Diagnosis not present

## 2023-01-24 ENCOUNTER — Other Ambulatory Visit (HOSPITAL_COMMUNITY): Payer: Self-pay | Admitting: Family Medicine

## 2023-01-24 DIAGNOSIS — Z0000 Encounter for general adult medical examination without abnormal findings: Secondary | ICD-10-CM

## 2023-01-30 DIAGNOSIS — M6281 Muscle weakness (generalized): Secondary | ICD-10-CM | POA: Diagnosis not present

## 2023-01-30 DIAGNOSIS — M62838 Other muscle spasm: Secondary | ICD-10-CM | POA: Diagnosis not present

## 2023-02-20 ENCOUNTER — Ambulatory Visit (HOSPITAL_COMMUNITY)
Admission: RE | Admit: 2023-02-20 | Discharge: 2023-02-20 | Disposition: A | Discharge status: Home / Self Care | Payer: Medicare Other | Source: Ambulatory Visit | Attending: Family Medicine | Admitting: Family Medicine

## 2023-02-20 DIAGNOSIS — Z0000 Encounter for general adult medical examination without abnormal findings: Secondary | ICD-10-CM | POA: Insufficient documentation

## 2023-03-13 ENCOUNTER — Ambulatory Visit: Payer: Medicare Other | Admitting: Orthopaedic Surgery

## 2023-03-26 ENCOUNTER — Other Ambulatory Visit: Payer: Self-pay

## 2023-03-26 ENCOUNTER — Ambulatory Visit: Payer: Medicare Other | Admitting: Orthopaedic Surgery

## 2023-03-26 DIAGNOSIS — G8929 Other chronic pain: Secondary | ICD-10-CM

## 2023-03-26 DIAGNOSIS — M25512 Pain in left shoulder: Secondary | ICD-10-CM

## 2023-03-26 NOTE — Progress Notes (Signed)
Office Visit Note   Patient: Douglas Hendricks           Date of Birth: 05-Dec-1955           MRN: [id number] Visit Date: 03/26/2023              Requested by: [name], MD [address] [place],  [place] [address] PCP: [name], MD   Assessment & Plan: Visit Diagnoses:  1. Chronic left shoulder pain     Plan: Douglas Hendricks is a 67 year old gentleman with recurrent left shoulder pain.  Glenohumeral injection provided 6 weeks of relief but the pain has returned and now is constant.  Home exercises and Voltaren gel did not help.  At this point we will order an MRI to rule out structural abnormalities.  I will be in touch with him through MyChart after the MRI.  Follow-Up Instructions: No follow-ups on file.   Orders:  No orders of the defined types were placed in this encounter.  No orders of the defined types were placed in this encounter.     Procedures: No procedures performed   Clinical Data: No additional findings.   Subjective: Chief Complaint  Patient presents with   Left Shoulder - Pain    HPI Douglas Hendricks returns today for follow-up of left shoulder pain.  Glenohumeral injection helped tremendously but only for 6 weeks as the pain has now returned.  He was able to do the bike trip in [place] without any problems.  Review of Systems   Objective: Vital Signs: There were no vitals taken for this visit.  Physical Exam  Ortho Exam Examination of left shoulder shows no changes. Specialty Comments:  MRI CERVICAL SPINE WITHOUT CONTRAST  TECHNIQUE: Multiplanar, multisequence MR imaging of the cervical spine was performed. No intravenous contrast was administered.  COMPARISON: Radiography 12/19/2021  FINDINGS: Alignment: Slightly exaggerated upper cervical lordosis. Mild scoliotic curvature convex to the right.  Vertebrae: No fracture or focal bone lesion.  Cord: No cord compression or focal cord lesion.  Posterior Fossa, vertebral arteries,  paraspinal tissues: Negative  Disc levels:  The foramen magnum is widely patent. There is ordinary mild osteoarthritis of the C1-2 articulation but no encroachment upon the neural structures.  C2-3: Minimal facet degeneration on the left. No canal or foraminal stenosis.  C3-4: Endplate osteophytes and bulging of the disc more prominent towards the left. Facet osteoarthritis on the left. No canal stenosis. Moderate left foraminal narrowing could affect the left C4 nerve.  C4-5: Endplate osteophytes and minimal bulging of the disc towards the left. Facet osteoarthritis on the left. No canal stenosis. Left foraminal stenosis could affect the C5 nerve.  C5-6: Endplate osteophytes and mild bulging of the disc. Facet degeneration on the left. No compressive canal stenosis. Left foraminal stenosis could affect the left C6 nerve. There is a small synovial cyst in a subligamentous location on the left.  C6-7: Endplate osteophytes and bulging of the disc. No compressive canal or foraminal narrowing.  C7-T1: Minimal disc bulge. Mild facet degeneration on the left. Mild left foraminal narrowing.  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  Lesser foraminal narrowing on the left at C7-T1 due to encroachment by facet osteophytes.   Electronically Signed By: Paulina Fusi M.D. On: 12/28/2021 16:01  Imaging: No results found.   PMFS History: Patient Active Problem List   Diagnosis Date  Noted   Pain in right hip 06/19/2022   Partial tear of left rotator cuff 02/16/2020   Impingement syndrome of left shoulder 02/16/2020   Pain in left shoulder 08/19/2019   Acromioclavicular sprain, left, initial encounter 07/09/2019   Chronic right shoulder pain 08/25/2018   Ventral hernia without obstruction or gangrene 03/18/2014   Past Medical History:   Diagnosis Date   Arthritis    Hernia, inguinal, left 2017   Hyperlipidemia    Thoracic stomach hernia 2015    Family History  Problem Relation Age of Onset   Cancer Mother        pancreatic   COPD Father    Cancer Father        esophageal    Past Surgical History:  Procedure Laterality Date   ABDOMINAL HERNIA REPAIR     hip pain Bilateral    HIP SURGERY Right    INGUINAL HERNIA REPAIR Left    INNER EAR SURGERY Right    KNEE SURGERY Left    SHOULDER SURGERY     TOTAL HIP ARTHROPLASTY     right   TYMPANOSTOMY Right    Social History   Occupational History   Not on file  Tobacco Use   Smoking status: Never   Smokeless tobacco: Never  Vaping Use   Vaping status: Never Used  Substance and Sexual Activity   Alcohol use: Yes    Comment: 1-2   Drug use: No   Sexual activity: Not on file

## 2023-04-06 ENCOUNTER — Ambulatory Visit
Admission: RE | Admit: 2023-04-06 | Discharge: 2023-04-06 | Disposition: A | Discharge status: Home / Self Care | Payer: Medicare Other | Source: Ambulatory Visit | Attending: Orthopaedic Surgery | Admitting: Orthopaedic Surgery

## 2023-04-06 DIAGNOSIS — M25511 Pain in right shoulder: Secondary | ICD-10-CM | POA: Diagnosis not present

## 2023-04-06 DIAGNOSIS — G8929 Other chronic pain: Secondary | ICD-10-CM | POA: Diagnosis not present

## 2023-04-06 DIAGNOSIS — S46011A Strain of muscle(s) and tendon(s) of the rotator cuff of right shoulder, initial encounter: Secondary | ICD-10-CM | POA: Diagnosis not present

## 2023-04-12 ENCOUNTER — Encounter: Payer: Self-pay | Admitting: Orthopaedic Surgery

## 2023-04-25 ENCOUNTER — Other Ambulatory Visit: Payer: Self-pay | Admitting: Oncology

## 2023-04-25 DIAGNOSIS — Z006 Encounter for examination for normal comparison and control in clinical research program: Secondary | ICD-10-CM

## 2023-05-01 ENCOUNTER — Ambulatory Visit: Payer: Medicare Other | Admitting: Orthopaedic Surgery

## 2023-05-01 ENCOUNTER — Encounter: Payer: Self-pay | Admitting: Orthopaedic Surgery

## 2023-05-01 DIAGNOSIS — M75122 Complete rotator cuff tear or rupture of left shoulder, not specified as traumatic: Secondary | ICD-10-CM | POA: Diagnosis not present

## 2023-05-01 DIAGNOSIS — M7542 Impingement syndrome of left shoulder: Secondary | ICD-10-CM | POA: Diagnosis not present

## 2023-05-01 NOTE — Progress Notes (Signed)
Office Visit Note   Patient: Douglas Hendricks           Date of Birth: 17-Jul-1956           MRN: [id number] Visit Date: 05/01/2023              Requested by: Douglas Floro, MD [address] [place],  [place] [address] PCP: Douglas Floro, MD   Assessment & Plan: Visit Diagnoses:  1. Nontraumatic complete tear of left rotator cuff   2. Impingement syndrome of left shoulder     Plan: MRI of the left shoulder shows full-thickness tear of the supraspinatus with approximately 3 cm of retraction.  There is severe tendinosis of the biceps tendon as well as unfavorable acromial anatomy.  Based on these findings I have recommended arthroscopic surgery with rotator cuff repair, extensive debridement including biceps tenotomy and subacromial decompression.  Risk benefits prognosis reviewed with the patient.  [name] will contact the patient to schedule surgery.  Follow-Up Instructions: No follow-ups on file.   Orders:  No orders of the defined types were placed in this encounter.  No orders of the defined types were placed in this encounter.     Procedures: No procedures performed   Clinical Data: No additional findings.   Subjective: Chief Complaint  Patient presents with   Left Shoulder - Follow-up    MRI review    HPI [name] is a 67 year old gentleman here to review MRI scan.  The previous cortisone injection only gave him a few weeks of relief but it did allow him to enjoy his vacation in [place]. Review of Systems  Constitutional: Negative.   HENT: Negative.    Eyes: Negative.   Respiratory: Negative.    Cardiovascular: Negative.   Gastrointestinal: Negative.   Endocrine: Negative.   Genitourinary: Negative.   Skin: Negative.   Allergic/Immunologic: Negative.   Neurological: Negative.   Hematological: Negative.   Psychiatric/Behavioral: Negative.    All other systems reviewed and are negative.    Objective: Vital Signs: There were no vitals taken for  this visit.  Physical Exam Vitals and nursing note reviewed.  Constitutional:      Appearance: He is well-developed.  HENT:     Head: Normocephalic and atraumatic.  Eyes:     Pupils: Pupils are equal, round, and reactive to light.  Pulmonary:     Effort: Pulmonary effort is normal.  Abdominal:     Palpations: Abdomen is soft.  Musculoskeletal:        General: Normal range of motion.     Cervical back: Neck supple.  Skin:    General: Skin is warm.  Neurological:     Mental Status: He is alert and oriented to person, place, and time.  Psychiatric:        Behavior: Behavior normal.        Thought Content: Thought content normal.        Judgment: Judgment normal.     Ortho Exam Examination of the left shoulder shows pain with impingement testing.  He has pain and weakness with manual muscle testing of the supraspinatus.  Pain with Speed test. Specialty Comments:  MRI CERVICAL SPINE WITHOUT CONTRAST  TECHNIQUE: Multiplanar, multisequence MR imaging of the cervical spine was performed. No intravenous contrast was administered.  COMPARISON: Radiography 12/19/2021  FINDINGS: Alignment: Slightly exaggerated upper cervical lordosis. Mild scoliotic curvature convex to the right.  Vertebrae: No fracture or focal bone lesion.  Cord: No cord compression or focal cord  lesion.  Posterior Fossa, vertebral arteries, paraspinal tissues: Negative  Disc levels:  The foramen magnum is widely patent. There is ordinary mild osteoarthritis of the C1-2 articulation but no encroachment upon the neural structures.  C2-3: Minimal facet degeneration on the left. No canal or foraminal stenosis.  C3-4: Endplate osteophytes and bulging of the disc more prominent towards the left. Facet osteoarthritis on the left. No canal stenosis. Moderate left foraminal narrowing could affect the left C4 nerve.  C4-5: Endplate osteophytes and minimal bulging of the disc towards the left. Facet  osteoarthritis on the left. No canal stenosis. Left foraminal stenosis could affect the C5 nerve.  C5-6: Endplate osteophytes and mild bulging of the disc. Facet degeneration on the left. No compressive canal stenosis. Left foraminal stenosis could affect the left C6 nerve. There is a small synovial cyst in a subligamentous location on the left.  C6-7: Endplate osteophytes and bulging of the disc. No compressive canal or foraminal narrowing.  C7-T1: Minimal disc bulge. Mild facet degeneration on the left. Mild left foraminal narrowing.  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  Lesser foraminal narrowing on the left at C7-T1 due to encroachment by facet osteophytes.   Electronically Signed By: Paulina Fusi M.D. On: 12/28/2021 16:01  Imaging: No results found.   PMFS History: Patient Active Problem List   Diagnosis Date Noted   Pain in right hip 06/19/2022   Partial tear of left rotator cuff 02/16/2020   Impingement syndrome of left shoulder 02/16/2020   Pain in left shoulder 08/19/2019   Acromioclavicular sprain, left, initial encounter 07/09/2019   Chronic right shoulder pain 08/25/2018   Ventral hernia without obstruction or gangrene 03/18/2014   Past Medical History:  Diagnosis Date   Arthritis    Hernia, inguinal, left 2017   Hyperlipidemia    Thoracic stomach hernia 2015    Family History  Problem Relation Age of Onset   Cancer Mother        pancreatic   COPD Father    Cancer Father        esophageal    Past Surgical History:  Procedure Laterality Date   ABDOMINAL HERNIA REPAIR     hip pain Bilateral    HIP SURGERY Right    INGUINAL HERNIA REPAIR Left    INNER EAR SURGERY Right    KNEE SURGERY Left    SHOULDER SURGERY     TOTAL HIP ARTHROPLASTY     right   TYMPANOSTOMY Right    Social History   Occupational  History   Not on file  Tobacco Use   Smoking status: Never   Smokeless tobacco: Never  Vaping Use   Vaping status: Never Used  Substance and Sexual Activity   Alcohol use: Yes    Comment: 1-2   Drug use: No   Sexual activity: Not on file

## 2023-05-06 ENCOUNTER — Ambulatory Visit: Payer: Medicare Other | Admitting: Cardiology

## 2023-05-13 ENCOUNTER — Ambulatory Visit: Payer: Medicare Other | Admitting: Cardiology

## 2023-05-14 ENCOUNTER — Encounter (HOSPITAL_BASED_OUTPATIENT_CLINIC_OR_DEPARTMENT_OTHER): Payer: Self-pay | Admitting: Orthopaedic Surgery

## 2023-05-14 ENCOUNTER — Other Ambulatory Visit: Payer: Self-pay | Admitting: Physician Assistant

## 2023-05-14 MED ORDER — HYDROCODONE-ACETAMINOPHEN 7.5-325 MG PO TABS: 1.0000 | ORAL_TABLET | Freq: Four times a day (QID) | ORAL | 0 refills | Status: DC | PRN

## 2023-05-14 MED ORDER — ONDANSETRON HCL 4 MG PO TABS: 4.0000 mg | ORAL_TABLET | Freq: Three times a day (TID) | ORAL | 0 refills | Status: DC | PRN

## 2023-05-15 ENCOUNTER — Ambulatory Visit: Payer: Medicare Other | Attending: Cardiology | Admitting: Cardiology

## 2023-05-15 ENCOUNTER — Encounter: Payer: Self-pay | Admitting: Cardiology

## 2023-05-15 VITALS — BP 118/78 | HR 74 | Ht 71.0 in | Wt 210.4 lb

## 2023-05-15 DIAGNOSIS — R931 Abnormal findings on diagnostic imaging of heart and coronary circulation: Secondary | ICD-10-CM | POA: Diagnosis not present

## 2023-05-15 DIAGNOSIS — E785 Hyperlipidemia, unspecified: Secondary | ICD-10-CM | POA: Diagnosis not present

## 2023-05-15 MED ORDER — ROSUVASTATIN CALCIUM 20 MG PO TABS: 20.0000 mg | ORAL_TABLET | Freq: Every day | ORAL | 6 refills | Status: DC

## 2023-05-15 NOTE — Patient Instructions (Signed)
Medication Instructions:   Atorvastatin has been removed form your list    Start taking Rosuvastatin 10 mg ( 1/2 tablet of 20 mg )  daily  for one month if no symptoms increase to a 20 mg daily  *If you need a refill on your cardiac medications before your next appointment, please call your pharmacy*   Lab Work:  Lipid in 3 months - fasting  ( Dec 2024)   If you have labs (blood work) drawn today and your tests are completely normal, you will receive your results only by: MyChart Message (if you have MyChart) OR A paper copy in the mail If you have any lab test that is abnormal or we need to change your treatment, we will call you to review the results.   Testing/Procedures: No tneeded   Follow-Up: At [facility], you and your health needs are our priority.  As part of our continuing mission to provide you with exceptional heart care, we have created designated Provider Care Teams.  These Care Teams include your primary Cardiologist (physician) and Advanced Practice Providers (APPs -  Physician Assistants and Nurse Practitioners) who all work together to provide you with the care you need, when you need it.     Your next appointment:   6 month(s)  The format for your next appointment:   In Person  Provider:   Bryan Lemma, MD

## 2023-05-15 NOTE — Progress Notes (Signed)
Cardiology Office Note:  .   Date:  05/18/2023  ID:  Dwyane Luo, DOB 1956-04-23, MRN [id number] PCP: [name], MD  [facility] Providers Cardiologist:  Bryan Lemma, MD     Chief Complaint  Patient presents with   New Patient (Initial Visit)    Coronary Calcium Score 189 and statin intolerance    History of Present Illness: .     Jeremiyah Morgese is a borderline obese, albeit otherwise healthy 67 y.o. male non-smoker with a PMH notable for HLD (statin intolerance), OSA-CPAP who presents here for elevated Coronary Calcium Score and statin intolerance at the request of [name], MD.  Dwyane Luo was seen by [name] on 01/23/2023 for routine follow-up.  Due to hyperlipidemia, was referred for Coronary calcium score.  With the score of 189 and hepatic statin intolerance, he is referred for cardiology evaluation.    Subjective  INTERVAL HISTORY Yehuda Okubo is a relatively healthy, active recently retired gentleman who exercises pretty much routinely and at baseline eats a relatively healthy diet.  He does mention that in the past he tried atorvastatin 20 mg and had significant myalgias to the point where he was unable to do his routine exercise.  He tried to cut the dose in half and had similar symptoms.  He does not have much of a family history of coronary disease-brother had valve surgery, but his siblings have all been relatively healthy. Diet: Eats vegetables and minimal red meat.  Average water intake 64 ounces a day.  Exercise: Bikes on average 20 to 30 miles a day 2 days a week, does elliptical for 45 minutes 2 days a week and swims a mile 2 times a week.   With all of his activity that he does, he denies any issues at all with chest pain pressure or dyspnea with rest or exertion.  No angina or heart failure symptoms.  No arrhythmia symptom would be section of 1 episode.  About 2 weeks ago he went for longer right than usual up to 30 miles with a younger  colleague and was going a lot faster than usual.  He is heart rate got up into the 170s for sustained period of time and he felt a tightness in his chest with shortness of breath.  He had to stop riding for little while to catch his breath and allow his heart rate to come down.  It resolved relatively spontaneously with slowing down and he was able to complete his right.  He has not had any symptoms like this since.  Usually on average when he exercises doing elliptical or biking he tries to keep his heart rate around the 140s to 150s and in that rate he is doing fine.  This particular episode he was "pushing it harder than usual ".  Since then he is very careful to avoid excess heart rates.  ROS:  Review of Systems - Negative except episode of chest tightness associated with tachycardia during exercise noted above     Objective   Studies Reviewed: Marland Kitchen   EKG Interpretation Date/Time:  Wednesday May 15 2023 16:08:13 EDT Ventricular Rate:  70 PR Interval:  144 QRS Duration:  82 QT Interval:  376 QTC Calculation: 406 R Axis:   46  Text Interpretation: Normal sinus rhythm Nonspecific T wave abnormality No previous ECGs available Confirmed by Bryan Lemma ([id number]) on 05/15/2023 4:36:42 PM    Coronary Calcium Score 02/20/2023: Total score 189 (mostly LAD)  Labs  from PCPs office 01/16/2023: TC 228, TG 94, HDL 66, LDL 146   Risk Assessment/Calculations:      Physical Exam:   VS:  BP 118/78   Pulse 74   Ht 5\' 11"  (1.803 m)   Wt 210 lb 6.4 oz (95.4 kg)   SpO2 94%   BMI 29.34 kg/m    Wt Readings from Last 3 Encounters:  05/15/23 210 lb 6.4 oz (95.4 kg)  02/16/20 195 lb (88.5 kg)  10/29/19 198 lb (89.8 kg)    GEN: Well nourished, well developed in no acute distress; healthy-appearing.  Well-groomed. NECK: No JVD; No carotid bruits CARDIAC: Normal S1, S2; RRR, no murmurs, rubs, gallops RESPIRATORY:  Clear to auscultation without rales, wheezing or rhonchi ; nonlabored, good air  movement. ABDOMEN: Soft, non-tender, non-distended EXTREMITIES:  No edema; No deformity      ASSESSMENT AND PLAN: .    Problem List Items Addressed This Visit       Cardiology Problems   Elevated coronary artery calcium score (Chronic)    Coronary Score of 189 mostly in the LAD which is a little more concerning.  He is very active exercises vigorously and only had that 1 episode of shortness of breath and chest tightness when he kept his heart rate up above 170 beats a minute.  Has not had any symptoms with his other vigorous exercise maintaining heart rates in the 140s and 150s.  As such, he seems to have passed a stress test.  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  We discussed pathophysiology of atherosclerotic disease and the role of lipids most notably LDL as well as the treatment options with statins and Nexletol, Zetia as well as PCSK9 inhibitors.  He did not tolerate 20 mg atorvastatin, but understands that he probably needs to be on some therapy and is willing to try other medications.  Thankfully, since he is not symptomatic, and Coronary Calcium Score is not high risk, we can take the time to try to get him on appropriate medications starting off of any statin.  I do recommend 81 mg aspirin. We spent most of the visit discussing these issues.  Plan: Start aspirin 81 mg daily (okay to hold 5 to 7 days preop for surgeries or procedures.) Will try to start with rosuvastatin starting at 10 mg daily and then try to titrate up to 20 mg if tolerated.      Relevant Medications   rosuvastatin (CRESTOR) 20 MG tablet   Other Relevant Orders   Lipid panel   Hyperlipidemia LDL goal <70 (Chronic)    With coronary Score mostly in the LAD, we would want to shoot for an LDL less than 70 if possible given evidence of coronary artery disease.  Likely nonobstructive based on the fact that he is exercising vigorously with no symptoms. Previously intolerant of both 10 and 20  mg of atorvastatin due to myalgias and confusion.  Plan: Rechallenge with rosuvastatin starting at 10 mg daily and then increase to 20 mg after 1 month if tolerated. Reassess labs in 3 months Follow-up in 6 months.   If not able to tolerate rosuvastatin by the 82-month point, would probably then try pravastatin.  At 70-month follow-up we would also check lipid panel prior to that visit and determine his tolerance of statins. Depending on follow-up labs as well as his tolerance of statins, would potentially consider CVRR consultation to consider PCSK9 inhibitors although I think the next option would be likely  to start Nexlizet      Relevant Medications   rosuvastatin (CRESTOR) 20 MG tablet   Other Relevant Orders   Lipid panel   Other Visit Diagnoses     Abnormal CT scan of heart    -  Primary   Relevant Orders   EKG 12-Lead (Completed)   Lipid panel              Dispo: Return in about 6 months (around 11/12/2023) for 6 month follow-up with me.  Total time spent: 28 min spent with patient + 18 min spent charting = 46 min    Signed, Marykay Lex, MD, MS Bryan Lemma, M.D., M.S. Interventional Cardiologist  [facility]  Pager # [phone] Phone # [phone] [address] [place], [place] [address]

## 2023-05-18 ENCOUNTER — Encounter: Payer: Self-pay | Admitting: Cardiology

## 2023-05-18 NOTE — Assessment & Plan Note (Signed)
With coronary Score mostly in the LAD, we would want to shoot for an LDL less than 70 if possible given evidence of coronary artery disease.  Likely nonobstructive based on the fact that he is exercising vigorously with no symptoms. Previously intolerant of both 10 and 20 mg of atorvastatin due to myalgias and confusion.  Plan: Rechallenge with rosuvastatin starting at 10 mg daily and then increase to 20 mg after 1 month if tolerated. Reassess labs in 3 months Follow-up in 6 months.   If not able to tolerate rosuvastatin by the 66-month point, would probably then try pravastatin.  At 43-month follow-up we would also check lipid panel prior to that visit and determine his tolerance of statins. Depending on follow-up labs as well as his tolerance of statins, would potentially consider CVRR consultation to consider PCSK9 inhibitors although I think the next option would be likely to start Nexlizet

## 2023-05-18 NOTE — Assessment & Plan Note (Signed)
Coronary Score of 189 mostly in the LAD which is a little more concerning.  He is very active exercises vigorously and only had that 1 episode of shortness of breath and chest tightness when he kept his heart rate up above 170 beats a minute.  Has not had any symptoms with his other vigorous exercise maintaining heart rates in the 140s and 150s.  As such, he seems to have passed a stress test.  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  We discussed pathophysiology of atherosclerotic disease and the role of lipids most notably LDL as well as the treatment options with statins and Nexletol, Zetia as well as PCSK9 inhibitors.  He did not tolerate 20 mg atorvastatin, but understands that he probably needs to be on some therapy and is willing to try other medications.  Thankfully, since he is not symptomatic, and Coronary Calcium Score is not high risk, we can take the time to try to get him on appropriate medications starting off of any statin.  I do recommend 81 mg aspirin. We spent most of the visit discussing these issues.  Plan: Start aspirin 81 mg daily (okay to hold 5 to 7 days preop for surgeries or procedures.) Will try to start with rosuvastatin starting at 10 mg daily and then try to titrate up to 20 mg if tolerated.

## 2023-05-30 ENCOUNTER — Encounter: Payer: Medicare Other | Admitting: Physician Assistant

## 2023-06-18 ENCOUNTER — Ambulatory Visit: Payer: Medicare Other | Admitting: Licensed Clinical Social Worker

## 2023-06-18 DIAGNOSIS — F4329 Adjustment disorder with other symptoms: Secondary | ICD-10-CM | POA: Insufficient documentation

## 2023-06-18 NOTE — Progress Notes (Signed)
[facility] Counselor/Therapist Progress Note  Patient ID: Douglas Hendricks, MRN: [id number]    Date: 06/18/23  Time Spent: 1100  am - 1200 am : 60 Minutes  Treatment Type: Individual Therapy/Assessment/Treatment Plan  Reported Symptoms: Patient reports difficulty maintaining relationships  Mental Status Exam: Appearance:  Casual     Behavior: Appropriate  Motor: Normal  Speech/Language:  Clear and Coherent  Affect: Appropriate  Mood: normal  Thought process: normal  Thought content:   WNL  Sensory/Perceptual disturbances:   WNL  Orientation: oriented to person, place, time/date, situation, day of week, month of year, and year  Attention: Good  Concentration: Good  Memory: WNL  Fund of knowledge:  Good  Insight:   Good  Judgment:  Good  Impulse Control: Good   Risk Assessment: Danger to Self:  No Self-injurious Behavior: No Danger to Others: No Duty to Warn:no Physical Aggression / Violence:No  Access to Firearms a concern: No  Gang Involvement:No   Subjective:   Douglas Hendricks participated from office, located at [facility] with Clinician present. Douglas Hendricks consented to treatment.  Presenting Problem Chief Complaint: Patient reports having difficulty maintaining relationships.  What are the main stressors in your life right now, how long? Appetite Change   2 Relationship Issues 3  Previous mental health services Have you ever been treated for a mental health problem, when, where, by whom? Yes, depression in 2014,  Provider in [place] at a practice located on [address].  Are you currently seeing a therapist or counselor, counselor's name? No   Have you ever had a mental health hospitalization, how many times, length of stay? No   Have you ever been treated with medication, name, reason, response? Yes, Wellbutrin, Rx'd for depression, patient reports that the medication was effective and utilized between 9 and 15 months.  Have you ever  had suicidal thoughts or attempted suicide, when, how? No   Risk factors for Suicide Demographic factors:  Male, Age 66 or older, and Living alone Current mental status: No plan to harm self or others Loss factors: NA Historical factors: NA Risk Reduction factors: Positive social support Clinical factors:  NA Cognitive features that contribute to risk: NA    SUICIDE RISK:  Minimal: No identifiable suicidal ideation.  Patients presenting with no risk factors but with morbid ruminations; may be classified as minimal risk based on the severity of the depressive symptoms  Medical history Medical treatment and/or problems, explain: Yes Prostate Issues, allergies,  Do you have any issues with chronic pain?  Yes Left Shoulder, scheuled for surgery 07/10/2023.  Name of primary care physician/last physical exam: [name]/[name].  Allergies: Yes Medication, reactions? Seasonal allergies, unidentified prostate medication, Atorvastatin.   Current medications: Zofran, Uroxatral, Zyrtec, Multiple Vitamin, Crestor Prescribed by: [name] and [name], [name] Is there any history of mental health problems or substance abuse in your family, whom? Yes,  All of patients siblings, brother with Alcohol, Brother and sister are medicated for mental health issues. Has anyone in your family been hospitalized, who, where, length of stay? No   Social/family history Have you been married, how many times?  1 marriage  Do you have children?  2 children  How many pregnancies have you had?  0  Who lives in your current household? Patient lives alone  Military history: No/ NA  Religious/spiritual involvement: Patient reports doing a daily Bible study and a previous deacon and charity work in [employer]. What religion/faith base  are you? Baptist  Family of origin (childhood history)  Parents and 3 sisters and 3 brothers  Where were you born[place] Where did you [place] up[place] How many different homes have you lived? 15 Describe the atmosphere of the household where you grew up: "Energetic, parents encourages [place] to be independent." Do you have siblings, step/half siblings, list names, relation, sex, age? Yes/ Douglas Hendricks, Douglas Hendricks, Douglas Hendricks, Douglas Hendricks, Douglas Hendricks,  Douglas Hendricks  Are your parents separated/divorced, when and why? No Remained married and maintained a healthy marriage  Are your parents alive? Yes   Social supports (personal and professional): 3 sibling sthat he is very close with.  Education How many grades have you completed? post college graduate work or degree Did you have any problems in school, what type? No  Medications prescribed for these problems? No   Employment (financial issues): Retired for 4 years, Denied financial issues.   Legal history:Denied   Trauma/Abuse history: Have you ever been exposed to any form of abuse, what type? No NA  Have you ever been exposed to something traumatic, describe? Yes Patient reports that there was some trauma, a lot of conflict, both did ugly and nasty things to each other.  Substance use Do you use Caffeine? Yes Type, frequency? 3 cups of coffee daily  Do you use Nicotine? No Type, frequency, ppd? NA   Do you use Alcohol? Yes Type, frequency? 4 beers daily  How old were you went you first tasted alcohol? 16 Was this accepted by your family? Yes  When was your last drink, type, how much? Yesterday, glass of bourbon.  Have you ever used illicit drugs or taken more than prescribed, type, frequency, date of last usage? 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  Treatment Plan:  PT reports having difficulty maintaining relationships. Patient has been in the current  relationship for 2 years and 2 months.  Client Abilities/Strengths: "Stability, financial security, good health, charity work in the community, Investment banker, corporate."   Support System: Patient reports that 3 of his siblings are his support network.   Client Treatment Preferences Cognitive Behavioral Therapy  Client Statement of Needs "I want to become better at being in  a relationship ."       Treatment Level Every other week  Symptoms: Withdrawal, avoiding conflict, Patient reports the following:  What can I do / how must I change to be better at serious relationships? How can I communicate better?  I am often afraid / reluctant to raise issues (conflict avoidance). My hesitancy to discuss the future of our relationship. Conflict between my faith and our relationship. Issues that have caused stress: Perceiving 'vibes' that cause me to withdraw and become uncommunicative.  I've been told many times that her quietness does not mean she is upset with me. My inability to understand and empathize with her mood(s). Not inviting her to my son's wedding, and not even having the discussion about my thoughts / feelings My pre-occupation with the election and the potential outcome, and my potential plans based on the outcome of the election. My lack of effort at connecting Douglas Hendricks with my children.  Goals:   "I want to become better at being in  a relationship ."  Target Date: 06/17/2024  Frequency: biweekly  Progress: 0 Modality: individual    Therapist will provide referrals for additional resources as appropriate.  Therapist will provide psycho-education regarding withdrawal and symptoms related to her diagnosis and corresponding treatment approaches and interventions. Licensed Clinical Social Worker, Phyllis Ginger, LCSW will support the patient's ability to achieve the goals identified. will employ CBT, BA, Problem-solving, Solution Focused, Mindfulness,  coping skills, & other evidenced-based  practices will be used to promote progress towards healthy functioning to help manage decrease symptoms associated with his diagnosis.   Reduce overall level, frequency, and intensity of the feelings of depression, anxiety and stress evidenced by decreased from 6 to 7 days/week to 0 to 2 days/week per client report for at least 3 consecutive months. Verbally express understanding of the relationship between feelings of depression, anxiety and their impact on thinking patterns and behaviors. Verbalize an understanding of the role that distorted thinking plays in creating fears, excessive worry, and ruminations.            Douglas Hendricks participated in the creation of the treatment plan.  Phyllis Ginger MSW, LCSW DATE:06/18/2023  Plan: Douglas Hendricks  is to use CBT, mindfulness and coping skills to help manage decrease symptoms associated with their diagnosis.   Long-term goal:   Douglas Hendricks will reduce overall level, frequency, and intensity of the feelings of depression, anxiety and stress evidenced by decreased irritability, negative self talk, and helpless feelings from 6 to 7 days/week to 0 to 2 days/week per client report for at least 3 consecutive months.  Short-term goal:  Douglas Hendricks will verbally express understanding of the relationship between feelings of depression, anxiety and their impact on thinking patterns and behaviors. Verbalize an understanding of the role that distorted thinking plays in creating fears, excessive worry, and ruminations.  Phyllis Ginger MSW, LCSW Date: 06/18/2023

## 2023-06-28 ENCOUNTER — Encounter: Payer: Self-pay | Admitting: Orthopaedic Surgery

## 2023-07-02 ENCOUNTER — Other Ambulatory Visit: Payer: Self-pay

## 2023-07-02 ENCOUNTER — Telehealth: Payer: Self-pay | Admitting: Orthopaedic Surgery

## 2023-07-02 ENCOUNTER — Encounter (HOSPITAL_BASED_OUTPATIENT_CLINIC_OR_DEPARTMENT_OTHER): Payer: Self-pay | Admitting: Orthopaedic Surgery

## 2023-07-02 NOTE — Telephone Encounter (Signed)
Patient is scheduled for left shoulder scope, extensive debridement, subacromial decompression, and possible rotator cuff repair.  He has several questions related to surgery and would like for someone to get back with him prior to his procedure at [facility] on 07[facility]10-[facility].  Please call him at [phone].    -Patient had the Covid injection last Friday and wants to know if this okay.  -Patient is scheduled for the RSV vaccine next Monday on 07-08-23.  Is it okay to proceed with vaccine, or would you suggest he reschedule/postpone?   -Patient would like to know the recovery time associated with THIS surgery.  He is asking about his mobility, limitations, and if physical therapy is part of the recovery. How long or frequent with the sessions if physical therapy is ordered?    -Patient swims.  When is he able to return to the water?    -Patient was originally scheduled for 05-22-23 for this surgery, however it was rescheduled.  At that time, a couple of prescriptions were sent to [facility]. He states they were never picked up.  Will new scripts be written or are they still valid?  -Patient wants to know if there is still the recommendation of sleeping in recliner, as this was the case when he had RIGHT shoulder surgery.

## 2023-07-02 NOTE — Telephone Encounter (Signed)
Douglas Hendricks, I will call about the other stuff, but unsure about vaccination questions.  What are your thoughts on those?

## 2023-07-02 NOTE — Telephone Encounter (Signed)
COVID injection last Friday is fine.  Would reschedule the RSV vaccine for a month after surgery

## 2023-07-03 ENCOUNTER — Telehealth: Payer: Self-pay | Admitting: Physician Assistant

## 2023-07-03 NOTE — Telephone Encounter (Signed)
Pt returned call to [place]. Pt asked for a call back at [phone].

## 2023-07-03 NOTE — Telephone Encounter (Signed)
Left vm to return my call

## 2023-07-04 ENCOUNTER — Telehealth: Payer: Self-pay | Admitting: Physician Assistant

## 2023-07-04 NOTE — Telephone Encounter (Signed)
Pt returned call to [facility] [place]. Call pt at [phone].

## 2023-07-04 NOTE — Telephone Encounter (Signed)
Notified patient of all answers.

## 2023-07-04 NOTE — Telephone Encounter (Signed)
I tried calling an hour ago and went straight to vm again.  Can you read him answers to questions that I sent?

## 2023-07-04 NOTE — Telephone Encounter (Signed)
Pt returned call to [facility] asking for a call back. Please call pt at [phone].

## 2023-07-04 NOTE — Telephone Encounter (Signed)
Ok that he had covid injection.  Would reschedule RSV injection a month or so after surgery.  I would wait 6 weeks to get into a body of water such a swimming pool in order to avoid risk of infection  You do not have to sleep in a recliner, although many patient say this is most comfortable for several weeks following surgery.  We will resend meds a few day to one week prior to surgery  For a rotator cuff repair, he will be in a sling for 6 weeks.  We will start him in outpatient PT at his first post-op visit.  He will probably go to PT 2x/week, but up to him and therapist.  Recovery will likely take 4-6 months.

## 2023-07-05 ENCOUNTER — Other Ambulatory Visit: Payer: Self-pay | Admitting: Physician Assistant

## 2023-07-05 MED ORDER — OXYCODONE-ACETAMINOPHEN 5-325 MG PO TABS
1.0000 | ORAL_TABLET | Freq: Four times a day (QID) | ORAL | 0 refills | Status: AC | PRN
Start: 2023-07-05 — End: ?

## 2023-07-05 MED ORDER — ONDANSETRON HCL 4 MG PO TABS: 4.0000 mg | ORAL_TABLET | Freq: Three times a day (TID) | ORAL | 0 refills | Status: DC | PRN

## 2023-07-08 NOTE — Progress Notes (Signed)

## 2023-07-09 ENCOUNTER — Ambulatory Visit: Payer: Medicare Other | Admitting: Licensed Clinical Social Worker

## 2023-07-10 ENCOUNTER — Ambulatory Visit (HOSPITAL_BASED_OUTPATIENT_CLINIC_OR_DEPARTMENT_OTHER)
Admission: RE | Admit: 2023-07-10 | Discharge: 2023-07-10 | Disposition: A | Discharge status: Home / Self Care | Payer: Medicare Other | Attending: Orthopaedic Surgery | Admitting: Orthopaedic Surgery

## 2023-07-10 ENCOUNTER — Other Ambulatory Visit: Payer: Self-pay

## 2023-07-10 ENCOUNTER — Ambulatory Visit (HOSPITAL_BASED_OUTPATIENT_CLINIC_OR_DEPARTMENT_OTHER): Payer: Medicare Other | Admitting: Anesthesiology

## 2023-07-10 ENCOUNTER — Encounter (HOSPITAL_BASED_OUTPATIENT_CLINIC_OR_DEPARTMENT_OTHER): Payer: Self-pay | Admitting: Orthopaedic Surgery

## 2023-07-10 ENCOUNTER — Encounter (HOSPITAL_BASED_OUTPATIENT_CLINIC_OR_DEPARTMENT_OTHER)
Admission: RE | Disposition: A | Discharge status: Home / Self Care | Payer: Self-pay | Source: Home / Self Care | Attending: Orthopaedic Surgery

## 2023-07-10 DIAGNOSIS — M7542 Impingement syndrome of left shoulder: Secondary | ICD-10-CM

## 2023-07-10 DIAGNOSIS — M25812 Other specified joint disorders, left shoulder: Secondary | ICD-10-CM | POA: Diagnosis not present

## 2023-07-10 DIAGNOSIS — M778 Other enthesopathies, not elsewhere classified: Secondary | ICD-10-CM | POA: Insufficient documentation

## 2023-07-10 DIAGNOSIS — Z7982 Long term (current) use of aspirin: Secondary | ICD-10-CM | POA: Insufficient documentation

## 2023-07-10 DIAGNOSIS — I2510 Atherosclerotic heart disease of native coronary artery without angina pectoris: Secondary | ICD-10-CM | POA: Insufficient documentation

## 2023-07-10 DIAGNOSIS — E785 Hyperlipidemia, unspecified: Secondary | ICD-10-CM | POA: Diagnosis not present

## 2023-07-10 DIAGNOSIS — S46812A Strain of other muscles, fascia and tendons at shoulder and upper arm level, left arm, initial encounter: Secondary | ICD-10-CM

## 2023-07-10 DIAGNOSIS — M75102 Unspecified rotator cuff tear or rupture of left shoulder, not specified as traumatic: Secondary | ICD-10-CM | POA: Diagnosis not present

## 2023-07-10 DIAGNOSIS — X58XXXA Exposure to other specified factors, initial encounter: Secondary | ICD-10-CM | POA: Diagnosis not present

## 2023-07-10 DIAGNOSIS — N400 Enlarged prostate without lower urinary tract symptoms: Secondary | ICD-10-CM | POA: Insufficient documentation

## 2023-07-10 DIAGNOSIS — M24112 Other articular cartilage disorders, left shoulder: Secondary | ICD-10-CM | POA: Diagnosis not present

## 2023-07-10 DIAGNOSIS — Y939 Activity, unspecified: Secondary | ICD-10-CM | POA: Insufficient documentation

## 2023-07-10 DIAGNOSIS — S46012A Strain of muscle(s) and tendon(s) of the rotator cuff of left shoulder, initial encounter: Secondary | ICD-10-CM | POA: Insufficient documentation

## 2023-07-10 DIAGNOSIS — M7522 Bicipital tendinitis, left shoulder: Secondary | ICD-10-CM | POA: Diagnosis not present

## 2023-07-10 DIAGNOSIS — G8918 Other acute postprocedural pain: Secondary | ICD-10-CM | POA: Diagnosis not present

## 2023-07-10 DIAGNOSIS — M1990 Unspecified osteoarthritis, unspecified site: Secondary | ICD-10-CM | POA: Insufficient documentation

## 2023-07-10 DIAGNOSIS — M19012 Primary osteoarthritis, left shoulder: Secondary | ICD-10-CM | POA: Insufficient documentation

## 2023-07-10 HISTORY — DX: Obstructive sleep apnea (adult) (pediatric): G47.33

## 2023-07-10 HISTORY — DX: Benign prostatic hyperplasia without lower urinary tract symptoms: N40.0

## 2023-07-10 HISTORY — DX: Other complications of anesthesia, initial encounter: T88.59XA

## 2023-07-10 HISTORY — PX: SHOULDER ARTHROSCOPY WITH ROTATOR CUFF REPAIR AND SUBACROMIAL DECOMPRESSION: SHX5686

## 2023-07-10 SURGERY — SHOULDER ARTHROSCOPY WITH ROTATOR CUFF REPAIR AND SUBACROMIAL DECOMPRESSION
Anesthesia: Regional | Site: Shoulder | Laterality: Left

## 2023-07-10 MED ORDER — FENTANYL CITRATE (PF) 100 MCG/2ML IJ SOLN
INTRAMUSCULAR | Status: AC
  Filled 2023-07-10: qty 2

## 2023-07-10 MED ORDER — ONDANSETRON HCL 4 MG/2ML IJ SOLN
INTRAMUSCULAR | Status: DC | PRN
  Administered 2023-07-10: 4 mg via INTRAVENOUS

## 2023-07-10 MED ORDER — SUGAMMADEX SODIUM 200 MG/2ML IV SOLN
INTRAVENOUS | Status: DC | PRN
  Administered 2023-07-10: 400 mg via INTRAVENOUS

## 2023-07-10 MED ORDER — ONDANSETRON HCL 4 MG/2ML IJ SOLN
INTRAMUSCULAR | Status: AC
  Filled 2023-07-10: qty 2

## 2023-07-10 MED ORDER — LIDOCAINE 2% (20 MG/ML) 5 ML SYRINGE
INTRAMUSCULAR | Status: DC | PRN
  Administered 2023-07-10: 80 mg via INTRAVENOUS

## 2023-07-10 MED ORDER — FENTANYL CITRATE (PF) 100 MCG/2ML IJ SOLN
100.0000 ug | Freq: Once | INTRAMUSCULAR | Status: AC
  Administered 2023-07-10: 100 ug via INTRAVENOUS

## 2023-07-10 MED ORDER — PHENYLEPHRINE HCL-NACL 20-0.9 MG/250ML-% IV SOLN
INTRAVENOUS | Status: DC | PRN
  Administered 2023-07-10: 35 ug/min via INTRAVENOUS

## 2023-07-10 MED ORDER — BUPIVACAINE LIPOSOME 1.3 % IJ SUSP
INTRAMUSCULAR | Status: DC | PRN
  Administered 2023-07-10: 10 mL via PERINEURAL

## 2023-07-10 MED ORDER — OXYCODONE HCL 5 MG PO TABS: 5.0000 mg | ORAL_TABLET | Freq: Once | ORAL | Status: DC | PRN

## 2023-07-10 MED ORDER — MIDAZOLAM HCL 2 MG/2ML IJ SOLN
INTRAMUSCULAR | Status: AC
  Filled 2023-07-10: qty 2

## 2023-07-10 MED ORDER — FENTANYL CITRATE (PF) 100 MCG/2ML IJ SOLN: 25.0000 ug | INTRAMUSCULAR | Status: DC | PRN

## 2023-07-10 MED ORDER — LACTATED RINGERS IV SOLN: INTRAVENOUS | Status: DC

## 2023-07-10 MED ORDER — ACETAMINOPHEN 500 MG PO TABS
1000.0000 mg | ORAL_TABLET | Freq: Once | ORAL | Status: AC
  Administered 2023-07-10: 1000 mg via ORAL

## 2023-07-10 MED ORDER — CEFAZOLIN SODIUM-DEXTROSE 2-4 GM/100ML-% IV SOLN
INTRAVENOUS | Status: AC
  Filled 2023-07-10: qty 100

## 2023-07-10 MED ORDER — SODIUM CHLORIDE 0.9 % IR SOLN
Status: DC | PRN
  Administered 2023-07-10: 21000 mL

## 2023-07-10 MED ORDER — PROPOFOL 10 MG/ML IV BOLUS
INTRAVENOUS | Status: AC
  Filled 2023-07-10: qty 20

## 2023-07-10 MED ORDER — PROPOFOL 10 MG/ML IV BOLUS
INTRAVENOUS | Status: DC | PRN
  Administered 2023-07-10: 200 mg via INTRAVENOUS

## 2023-07-10 MED ORDER — FENTANYL CITRATE (PF) 100 MCG/2ML IJ SOLN
INTRAMUSCULAR | Status: DC | PRN
  Administered 2023-07-10: 100 ug via INTRAVENOUS

## 2023-07-10 MED ORDER — BUPIVACAINE HCL (PF) 0.5 % IJ SOLN
INTRAMUSCULAR | Status: DC | PRN
  Administered 2023-07-10: 10 mL via PERINEURAL

## 2023-07-10 MED ORDER — DEXAMETHASONE SODIUM PHOSPHATE 10 MG/ML IJ SOLN
INTRAMUSCULAR | Status: AC
  Filled 2023-07-10: qty 1

## 2023-07-10 MED ORDER — CEFAZOLIN SODIUM-DEXTROSE 2-4 GM/100ML-% IV SOLN
2.0000 g | INTRAVENOUS | Status: AC
  Administered 2023-07-10: 2 g via INTRAVENOUS

## 2023-07-10 MED ORDER — SUGAMMADEX SODIUM 200 MG/2ML IV SOLN: INTRAVENOUS | Status: DC | PRN

## 2023-07-10 MED ORDER — DEXAMETHASONE SODIUM PHOSPHATE 4 MG/ML IJ SOLN
INTRAMUSCULAR | Status: DC | PRN
  Administered 2023-07-10: 10 mg via INTRAVENOUS

## 2023-07-10 MED ORDER — ROCURONIUM 10MG/ML (10ML) SYRINGE FOR MEDFUSION PUMP - OPTIME
INTRAVENOUS | Status: DC | PRN
  Administered 2023-07-10: 20 mg via INTRAVENOUS
  Administered 2023-07-10: 70 mg via INTRAVENOUS

## 2023-07-10 MED ORDER — OXYCODONE HCL 5 MG/5ML PO SOLN: 5.0000 mg | Freq: Once | ORAL | Status: DC | PRN

## 2023-07-10 MED ORDER — AMISULPRIDE (ANTIEMETIC) 5 MG/2ML IV SOLN: 10.0000 mg | Freq: Once | INTRAVENOUS | Status: DC | PRN

## 2023-07-10 MED ORDER — EPHEDRINE SULFATE (PRESSORS) 50 MG/ML IJ SOLN
INTRAMUSCULAR | Status: DC | PRN
  Administered 2023-07-10: 10 mg via INTRAVENOUS

## 2023-07-10 MED ORDER — ACETAMINOPHEN 500 MG PO TABS
ORAL_TABLET | ORAL | Status: AC
  Filled 2023-07-10: qty 2

## 2023-07-10 MED ORDER — MIDAZOLAM HCL 2 MG/2ML IJ SOLN
2.0000 mg | Freq: Once | INTRAMUSCULAR | Status: AC
  Administered 2023-07-10: 2 mg via INTRAVENOUS

## 2023-07-10 MED ORDER — LIDOCAINE 2% (20 MG/ML) 5 ML SYRINGE
INTRAMUSCULAR | Status: AC
  Filled 2023-07-10: qty 5

## 2023-07-10 SURGICAL SUPPLY — 60 items
ANCHOR BIOCOMP SWIVELOCK (Anchor) IMPLANT
ANCHOR FIBERTAK 2.6X1.7 BLUE (Anchor) IMPLANT
ANCHOR SUT BIO SW 4.75X19.1 (Anchor) IMPLANT
ANCHOR SUT FBRTK 2.6 SOFT 1.7 (Anchor) IMPLANT
ANCHOR SWIVELOCK SP KL 4.75 (Anchor) IMPLANT
BLADE EXCALIBUR 4.0X13 (MISCELLANEOUS) IMPLANT
BURR OVAL 8 FLU 4.0X13 (MISCELLANEOUS) ×1 IMPLANT
CANNULA 5.75X71 LONG (CANNULA) ×1 IMPLANT
CANNULA SHOULDER 7CM (CANNULA) IMPLANT
CANNULA TWIST IN 8.25X7CM (CANNULA) IMPLANT
COOLER ICEMAN CLASSIC (MISCELLANEOUS) ×1 IMPLANT
DISSECTOR 3.8MM X 13CM (MISCELLANEOUS) ×1 IMPLANT
DRAPE IMP U-DRAPE 54X76 (DRAPES) ×1 IMPLANT
DRAPE INCISE IOBAN 66X45 STRL (DRAPES) ×1 IMPLANT
DRAPE STERI 35X30 U-POUCH (DRAPES) ×1 IMPLANT
DRAPE U-SHAPE 47X51 STRL (DRAPES) ×2 IMPLANT
DRAPE U-SHAPE 76X120 STRL (DRAPES) ×2 IMPLANT
DURAPREP 26ML APPLICATOR (WOUND CARE) ×2 IMPLANT
ELECT REM PT RETURN 9FT ADLT (ELECTROSURGICAL)
ELECTRODE REM PT RTRN 9FT ADLT (ELECTROSURGICAL) IMPLANT
GAUZE PAD ABD 8X10 STRL (GAUZE/BANDAGES/DRESSINGS) ×2 IMPLANT
GAUZE SPONGE 4X4 12PLY STRL (GAUZE/BANDAGES/DRESSINGS) ×2 IMPLANT
GAUZE XEROFORM 1X8 LF (GAUZE/BANDAGES/DRESSINGS) ×1 IMPLANT
GLOVE BIOGEL PI IND STRL 7.0 (GLOVE) IMPLANT
GLOVE BIOGEL PI IND STRL 7.5 (GLOVE) ×1 IMPLANT
GLOVE BIOGEL PI IND STRL 8 (GLOVE) IMPLANT
GLOVE ECLIPSE 7.0 STRL STRAW (GLOVE) ×2 IMPLANT
GLOVE INDICATOR 7.0 STRL GRN (GLOVE) ×1 IMPLANT
GLOVE SURG SYN 7.5 E (GLOVE) ×2
GLOVE SURG SYN 7.5 PF PI (GLOVE) ×2 IMPLANT
GLOVE SURG SYN 8.0 (GLOVE) ×1
GLOVE SURG SYN 8.0 PF PI (GLOVE) IMPLANT
GOWN STRL REUS W/ TWL LRG LVL3 (GOWN DISPOSABLE) ×1 IMPLANT
GOWN STRL REUS W/ TWL XL LVL3 (GOWN DISPOSABLE) IMPLANT
GOWN STRL REUS W/TWL LRG LVL3 (GOWN DISPOSABLE) ×1
GOWN STRL REUS W/TWL XL LVL3 (GOWN DISPOSABLE) ×1
GOWN STRL SURGICAL XL XLNG (GOWN DISPOSABLE) ×2 IMPLANT
MANIFOLD NEPTUNE II (INSTRUMENTS) ×1 IMPLANT
NDL HD SCORPION MEGA LOADER (NEEDLE) IMPLANT
PACK ARTHROSCOPY DSU (CUSTOM PROCEDURE TRAY) ×1 IMPLANT
PACK BASIN DAY SURGERY FS (CUSTOM PROCEDURE TRAY) ×1 IMPLANT
PAD COLD SHLDR WRAP-ON (PAD) ×1 IMPLANT
PAD ORTHO SHOULDER 7X19 LRG (SOFTGOODS) IMPLANT
SHEET MEDIUM DRAPE 40X70 STRL (DRAPES) ×1 IMPLANT
SLEEVE SCD COMPRESS KNEE MED (STOCKING) ×1 IMPLANT
SLING ARM FOAM STRAP LRG (SOFTGOODS) IMPLANT
SPIKE FLUID TRANSFER (MISCELLANEOUS) IMPLANT
SUT ETHILON 3 0 PS 1 (SUTURE) ×1 IMPLANT
SUT FIBERWIRE #2 38 T-5 BLUE (SUTURE)
SUT TIGER TAPE 7 IN WHITE (SUTURE) IMPLANT
SUTURE FIBERWR #2 38 T-5 BLUE (SUTURE) IMPLANT
SUTURE TAPE 1.3 40 TPR END (SUTURE) IMPLANT
SUTURE TAPE TIGERLINK 1.3MM BL (SUTURE) IMPLANT
SUTURETAPE 1.3 40 TPR END (SUTURE)
SUTURETAPE TIGERLINK 1.3MM BL (SUTURE)
TAPE FIBER 2MM 7IN #2 BLUE (SUTURE) IMPLANT
TOWEL GREEN STERILE FF (TOWEL DISPOSABLE) ×1 IMPLANT
TUBE CONNECTING 20X1/4 (TUBING) ×1 IMPLANT
TUBING ARTHROSCOPY IRRIG 16FT (MISCELLANEOUS) ×1 IMPLANT
WAND ABLATOR APOLLO I90 (BUR) ×1 IMPLANT

## 2023-07-10 NOTE — Anesthesia Procedure Notes (Addendum)
Anesthesia Regional Block: Interscalene brachial plexus block   Pre-Anesthetic Checklist: , timeout performed,  Correct Patient, Correct Site, Correct Laterality,  Correct Procedure, Correct Position, site marked,  Risks and benefits discussed,  Surgical consent,  Pre-op evaluation,  At surgeon's request and post-op pain management  Laterality: Left  Prep: chloraprep       Needles:  Injection technique: Single-shot  Needle Type: Echogenic Stimulator Needle     Needle Length: 9cm  Needle Gauge: 21     Additional Needles:   Procedures:,,,, ultrasound used (permanent image in chart),,    Narrative:  Start time: 07/10/2023 12:33 PM End time: 07/10/2023 12:35 PM Injection made incrementally with aspirations every 5 mL.  Performed by: Personally  Anesthesiologist: Linton Rump, MD  Additional Notes: Discussed risks and benefits of nerve block including, but not limited to, prolonged and/or permanent nerve injury involving sensory and/or motor function. Monitors were applied and a time-out was performed. The nerve and associated structures were visualized under ultrasound guidance. After negative aspiration, local anesthetic was slowly injected around the nerve. There was no evidence of high pressure during the procedure. There were no paresthesias. VSS remained stable and the patient tolerated the procedure well.

## 2023-07-10 NOTE — Anesthesia Postprocedure Evaluation (Signed)
Anesthesia Post Note  Patient: Douglas Hendricks  Procedure(s) Performed: LEFT SHOULDER ARTHROSCOPY, EXTENSIVE DEBRIDEMENT, SUBACROMIAL DECOMPRESSION, ROTATOR CUFF REPAIR (Left: Shoulder)     Patient location during evaluation: PACU Anesthesia Type: Regional and General Level of consciousness: awake and alert Pain management: pain level controlled Vital Signs Assessment: post-procedure vital signs reviewed and stable Respiratory status: spontaneous breathing, nonlabored ventilation, respiratory function stable and patient connected to nasal cannula oxygen Cardiovascular status: blood pressure returned to baseline and stable Postop Assessment: no apparent nausea or vomiting Anesthetic complications: no  No notable events documented.  Last Vitals:  Vitals:   07/10/23 1600 07/10/23 1607  BP: 103/74   Pulse: (!) 55 (!) 57  Resp: 11 10  Temp:    SpO2: 93% 94%    Last Pain:  Vitals:   07/10/23 1600  TempSrc:   PainSc: 0-No pain                 Gregorey Nabor L Danile Trier

## 2023-07-10 NOTE — H&P (Signed)
PREOPERATIVE H&P  Chief Complaint: left shoulder rotator cuff tear, biceps tendinopathy  HPI: Douglas Hendricks is a 67 y.o. male who presents for surgical treatment of left shoulder rotator cuff tear, biceps tendinopathy.  He denies any changes in medical history.  Past Surgical History:  Procedure Laterality Date   ABDOMINAL HERNIA REPAIR  2015   Umbilical   Cataract surgery Bilateral    COLONOSCOPY  10/2019   Repeated every 3 years   hip pain Bilateral    INGUINAL HERNIA REPAIR Left    KNEE SURGERY Left 1997   SHOULDER SURGERY     TOTAL HIP ARTHROPLASTY Right 06/29/2013   [facility]-Dr. Everardo Beals ([facility])   TYMPANOSTOMY Right 04/2020   Social History   Socioeconomic History   Marital status: Divorced    Spouse name: Not on file   Number of children: Not on file   Years of education: Not on file   Highest education level: Not on file  Occupational History   Not on file  Tobacco Use   Smoking status: Never   Smokeless tobacco: Never   Tobacco comments:    Drinks 2 12 ounce servings of coffee a day.  Vaping Use   Vaping status: Never Used  Substance and Sexual Activity   Alcohol use: Yes    Alcohol/week: 4.0 standard drinks of alcohol    Types: 4 Standard drinks or equivalent per week    Comment: 1-2   Drug use: No   Sexual activity: Not on file  Other Topics Concern   Not on file  Social History Narrative   Married father of 2.   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      Diet: Eats vegetables and minimal red meat.  Average water intake 64 ounces a day.   Exercise: Bikes on average 20 to 30 miles a day 2 days a week, does elliptical for 45 minutes 2 days a week and swims a mile 2 times a week.   Social Determinants of Health   Financial Resource Strain: Not on file  Food Insecurity: Not on file  Transportation Needs: Not on file  Physical Activity: Not on file  Stress: Not on file  Social Connections: Not  on file   Family History  Problem Relation Age of Onset   Cancer Mother        pancreatic   COPD Father    Cancer Father        esophageal   Ovarian cancer Sister        In remission x 15 years   Throat cancer Sister    Healthy Sister    Valvular heart disease Brother        Heart valve surgery   Hyperlipidemia Brother    Other Brother        Low back pain from spinal:/Spinal stenosis   Glaucoma Brother    Allergies  Allergen Reactions   Other Itching   Atorvastatin     Confusions, myalgia   Grass Pollen([id number]-[id number]-Swt Vern)    Prior to Admission medications   Medication Sig Start Date End Date Taking? Authorizing Provider  alfuzosin (UROXATRAL) 10 MG 24 hr tablet Take 10 mg by mouth in the morning and at bedtime.   Yes [provider]  aspirin EC 81 MG tablet Take 81 mg by mouth daily. Swallow whole.   Yes [provider]  Multiple Vitamin (MULTIVITAMIN) capsule daily.   Yes [provider]  rosuvastatin (CRESTOR) 20 MG tablet Take 1 tablet (20 mg total) by mouth daily. As directed 05/15/23 08/13/23 Yes Marykay Lex, MD  tadalafil (CIALIS) 5 MG tablet Take 5 mg by mouth daily as needed.   Yes [provider]  cetirizine (ZYRTEC) 10 MG tablet Take 10 mg by mouth daily.    [provider]  HYDROcodone-acetaminophen (NORCO) 7.5-325 MG tablet Take 1 tablet by mouth every 6 (six) hours as needed for moderate pain. Patient not taking: Reported on 05/15/2023 05/14/23   Cristie Hem, PA-C  ondansetron (ZOFRAN) 4 MG tablet Take 1 tablet (4 mg total) by mouth every 8 (eight) hours as needed for nausea or vomiting. Patient not taking: Reported on 05/15/2023 05/14/23   Cristie Hem, PA-C  ondansetron (ZOFRAN) 4 MG tablet Take 1 tablet (4 mg total) by mouth every 8 (eight) hours as needed for nausea or vomiting. 07/05/23   Cristie Hem, PA-C  oxyCODONE-acetaminophen (PERCOCET) 5-325 MG tablet Take 1 tablet by mouth every 6 (six) hours as  needed. To  be taken after surgery 07/05/23   Cristie Hem, PA-C     Positive ROS: All other systems have been reviewed and were otherwise negative with the exception of those mentioned in the HPI and as above.  Physical Exam: General: Alert, no acute distress Cardiovascular: No pedal edema Respiratory: No cyanosis, no use of accessory musculature GI: abdomen soft Skin: No lesions in the area of chief complaint Neurologic: Sensation intact distally Psychiatric: Patient is competent for consent with normal mood and affect Lymphatic: no lymphedema  MUSCULOSKELETAL: exam stable  Assessment: left shoulder rotator cuff tear, biceps tendinopathy  Plan: Plan for Procedure(s): LEFT SHOULDER ARTHROSCOPY, EXTENSIVE DEBRIDEMENT, SUBACROMIAL DECOMPRESSION, POSSIBLE ROTATOR CUFF REPAIR  The risks benefits and alternatives were discussed with the patient including but not limited to the risks of nonoperative treatment, versus surgical intervention including infection, bleeding, nerve injury,  blood clots, cardiopulmonary complications, morbidity, mortality, among others, and they were willing to proceed.   Glee Arvin, MD 07/10/2023 11:12 AM

## 2023-07-10 NOTE — Transfer of Care (Signed)
Immediate Anesthesia Transfer of Care Note  Patient: Douglas Hendricks  Procedure(s) Performed: LEFT SHOULDER ARTHROSCOPY, EXTENSIVE DEBRIDEMENT, SUBACROMIAL DECOMPRESSION, ROTATOR CUFF REPAIR (Left: Shoulder)  Patient Location: PACU  Anesthesia Type:GA combined with regional for post-op pain  Level of Consciousness: awake, alert , oriented, drowsy, and patient cooperative  Airway & Oxygen Therapy: Patient Spontanous Breathing and Patient connected to face mask oxygen  Post-op Assessment: Report given to RN and Post -op Vital signs reviewed and stable  Post vital signs: Reviewed and stable  Last Vitals:  Vitals Value Taken Time  BP 107/49 07/10/23 1536  Temp    Pulse 67 07/10/23 1542  Resp 15 07/10/23 1542  SpO2 96 % 07/10/23 1542  Vitals shown include unfiled device data.  Last Pain:  Vitals:   07/10/23 1154  TempSrc: Temporal  PainSc: 2          Complications: No notable events documented.

## 2023-07-10 NOTE — Progress Notes (Signed)
Assisted Dr. Jennifer Allan with left, interscalene , ultrasound guided block. Side rails up, monitors on throughout procedure. See vital signs in flow sheet. Tolerated Procedure well. 

## 2023-07-10 NOTE — Discharge Instructions (Addendum)
May take tylenol after 6pm as needed.   Post-operative patient instructions  Shoulder Arthroscopy   Ice:  Place intermittent ice or cooler pack over your shoulder, 30 minutes on and 30 minutes off.  Continue this for the first 72 hours after surgery, then save ice for use after therapy sessions or on more active days.   Weight:  You may not bear weight on your arm.   Motion:  Perform gentle shoulder motion as tolerated Dressing:  Perform 1st dressing change at 2 days postoperative. A moderate amount of blood tinged drainage is to be expected.  So if you bleed through the dressing on the first or second day or if you have fevers, it is fine to change the dressing/check the wounds early and redress wound.  If it bleeds through again, or if the incisions are leaking Tray blood, please call the office. May change dressing every 1-2 days thereafter to help watch wounds. You may place regular band-aids over the incisions.   Shower:  Light shower is ok after 2 days.  Please take shower, NO bath. Recover with gauze and ace wrap to help keep wounds protected.   Pain medication:  A narcotic pain medication has been prescribed.  Take as directed.  Typically you need narcotic pain medication more regularly during the first 3 to 5 days after surgery.  Decrease your use of the medication as the pain improves.  Narcotics can sometimes cause constipation, even after a few doses.  If you have problems with constipation, you can take an over the counter stool softener or light laxative.  If you have persistent problems, please notify your physician's office. Physical therapy: Additional activity guidelines to be provided by your physician or physical therapist at follow-up visits.  Driving: Do not recommend driving x 2 weeks post surgical, especially if surgery performed on right side. Should not drive while taking narcotic pain medications. It typically takes at least 2 weeks to restore sufficient neuromuscular  function for normal reaction times for driving safety.  Call [phone] for questions or problems. Evenings you will be forwarded to the hospital operator.  Ask for the orthopaedic physician on call. Please call if you experience:    Redness, foul smelling, or persistent drainage from the surgical site  worsening shoulder pain and swelling not responsive to medication  any calf pain and or swelling of the lower leg  temperatures greater than 101.5 F other questions or concerns  Per [facility] clinic policy, our goal is ensure optimal postoperative pain control with a multimodal pain management strategy. For all [facility] patients, our goal is to wean post-operative narcotic medications by 6 weeks post-operatively. If this is not possible due to utilization of pain medication prior to surgery, your [facility] doctor will support your acute post-operative pain control for the first 6 weeks postoperatively, with a plan to transition you back to your primary pain team following that. Cyndia Skeeters will work to ensure a Therapist, occupational.  Thank you for allowing [place] to be a part of your care.  Regional Anesthesia Blocks  1. You may not be able to move or feel the "blocked" extremity after a regional anesthetic block. This may last may last from 3-48 hours after placement, but it will go away. The length of time depends on the medication injected and your individual response to the medication. As the nerves start to wake up, you may experience tingling as the movement and feeling returns to your extremity. If the numbness and inability  to move your extremity has not gone away after 48 hours, please call your surgeon.   2. The extremity that is blocked will need to be protected until the numbness is gone and the strength has returned. Because you cannot feel it, you will need to take extra care to avoid injury. Because it may be weak, you may have difficulty moving it or using it. You may not know what position it  is in without looking at it while the block is in effect.  3. For blocks in the legs and feet, returning to weight bearing and walking needs to be done carefully. You will need to wait until the numbness is entirely gone and the strength has returned. You should be able to move your leg and foot normally before you try and bear weight or walk. You will need someone to be with you when you first try to ensure you do not fall and possibly risk injury.  4. Bruising and tenderness at the needle site are common side effects and will resolve in a few days.  5. Persistent numbness or new problems with movement should be communicated to the surgeon or the [facility] [phone] [facility] [phone]).   Post Anesthesia Home Care Instructions  Activity: Get plenty of rest for the remainder of the day. A responsible individual must stay with you for 24 hours following the procedure.  For the next 24 hours, DO NOT: -Drive a car -Advertising copywriter -Drink alcoholic beverages -Take any medication unless instructed by your physician -Make any legal decisions or sign important papers.  Meals: Start with liquid foods such as gelatin or soup. Progress to regular foods as tolerated. Avoid greasy, spicy, heavy foods. If nausea and/or vomiting occur, drink only clear liquids until the nausea and/or vomiting subsides. Call your physician if vomiting continues.  Special Instructions/Symptoms: Your throat may feel dry or sore from the anesthesia or the breathing tube placed in your throat during surgery. If this causes discomfort, gargle with warm salt water. The discomfort should disappear within 24 hours.  If you had a scopolamine patch placed behind your ear for the management of post- operative nausea and/or vomiting:  1. The medication in the patch is effective for 72 hours, after which it should be removed.  Wrap patch in a tissue and discard in the trash. Wash hands  thoroughly with soap and water. 2. You may remove the patch earlier than 72 hours if you experience unpleasant side effects which may include dry mouth, dizziness or visual disturbances. 3. Avoid touching the patch. Wash your hands with soap and water after contact with the patch.     Information for Discharge Teaching: EXPAREL (bupivacaine liposome injectable suspension)   Pain relief is important to your recovery. The goal is to control your pain so you can move easier and return to your normal activities as soon as possible after your procedure. Your physician may use several types of medicines to manage pain, swelling, and more.  Your surgeon or anesthesiologist gave you EXPAREL(bupivacaine) to help control your pain after surgery.  EXPAREL is a local anesthetic designed to release slowly over an extended period of time to provide pain relief by numbing the tissue around the surgical site. EXPAREL is designed to release pain medication over time and can control pain for up to 72 hours. Depending on how you respond to EXPAREL, you may require less pain medication during your recovery. EXPAREL can help reduce or eliminate  the need for opioids during the first few days after surgery when pain relief is needed the most. EXPAREL is not an opioid and is not addictive. It does not cause sleepiness or sedation.   Important! A teal colored band has been placed on your arm with the date, time and amount of EXPAREL you have received. Please leave this armband in place for the full 96 hours following administration, and then you may remove the band. If you return to the hospital for any reason within 96 hours following the administration of EXPAREL, the armband provides important information that your health care providers to know, and alerts them that you have received this anesthetic.    Possible side effects of EXPAREL: Temporary loss of sensation or ability to move in the area where medication was  injected. Nausea, vomiting, constipation Rarely, numbness and tingling in your mouth or lips, lightheadedness, or anxiety may occur. Call your doctor right away if you think you may be experiencing any of these sensations, or if you have other questions regarding possible side effects.  Follow all other discharge instructions given to you by your surgeon or nurse. Eat a healthy diet and drink plenty of water or other fluids.

## 2023-07-10 NOTE — Anesthesia Procedure Notes (Signed)
Procedure Name: Intubation Date/Time: 07/10/2023 2:01 PM  Performed by: Karen Kitchens, CRNAPre-anesthesia Checklist: Patient identified, Emergency Drugs available, Suction available and Patient being monitored Patient Re-evaluated:Patient Re-evaluated prior to induction Oxygen Delivery Method: Circle system utilized Preoxygenation: Pre-oxygenation with 100% oxygen Induction Type: IV induction Ventilation: Mask ventilation without difficulty Laryngoscope Size: Mac and 4 Grade View: Grade I Tube type: Oral Tube size: 8.0 mm Number of attempts: 1 Airway Equipment and Method: Stylet and Oral airway Placement Confirmation: ETT inserted through vocal cords under direct vision, positive ETCO2, breath sounds checked- equal and bilateral and CO2 detector Secured at: 23 cm Tube secured with: Tape Dental Injury: Teeth and Oropharynx as per pre-operative assessment

## 2023-07-10 NOTE — Op Note (Signed)
Date of Surgery: 07/10/2023  INDICATIONS: The patient is a 67 year old male with left shoulder pain that has failed conservative treatment;  The patient did consent to the procedure after discussion of the risks and benefits.  DIAGNOSES: Left shoulder, acute traumatic rotator cuff tear, biceps tendinitis, subacromial impingement, and degenerative labral tear .  POST-OPERATIVE DIAGNOSIS: same  PROCEDURE: Arthroscopic extensive debridement - 29823 Subdeltoid Bursa, Supraspinatus Tendon, Anterior Labrum, Superior Labrum, Posterior Labrum, and biceps tenotomy Arthroscopic subacromial decompression - 86578 Arthroscopic rotator cuff repair - 46962   OPERATIVE FINDING: Exam under anesthesia: Normal Articular space: Normal Chondral surfaces: Normal Biceps:  severe tendinosis Subscapularis: Intact  Supraspinatus: Complete tear retracted medial to articular margin Infraspinatus: Intact  Unfavorable acromial anatomy  SURGEON: N. Glee Arvin, M.D.  ASSIST: Oneal Grout, PA-C  ANESTHESIA:  general, regional  IV FLUIDS AND URINE: See anesthesia.  ESTIMATED BLOOD LOSS: minimal mL.  IMPLANTS:  Implant Name Type Inv. Item Serial No. Manufacturer Lot No. LRB No. Used Action  [facility] SUT FBRTK 1.3X2.6X1.7 SLF - [id number] Anchor ANCH SUT FBRTK 1.3X2.6X1.7 SLF  [employer] [id number] Left 1 Implanted  ANCH SUT FBRTAPE 1.3X2.6X1.7 - [id number] Anchor ANCH SUT FBRTAPE 1.3X2.6X1.7  [employer] [id number] Left 1 Implanted  [facility] SUT SWLK 19.1X4.75 - [id number] Anchor ANCH SUT SWLK 19.1X4.75  [employer] [id number] Left 1 Implanted  ANCHOR SWIVELOCK SP KL 4.75 - [id number] Anchor ANCHOR SWIVELOCK SP KL 4.75  [employer] [id number] Left 1 Implanted and Explanted  [facility] SUT SWLK 19.1X4.75 - [id number] Anchor ANCH SUT SWLK 19.1X4.75  [employer] [id number] Left 1 Implanted    COMPLICATIONS: None.  DESCRIPTION OF PROCEDURE: The patient was brought to the operating room and placed supine on the operating  table.  The patient had been signed prior to the procedure and this was documented. The patient had the anesthesia placed by the anesthesiologist.  A time-out was performed to confirm that this was the correct patient, site, side and location. The patient did receive antibiotics prior to the incision and was re-dosed during the procedure as needed at indicated intervals.  The patient was then positioned into the beach chair position with all bony prominences well padded. The patient had the operative extremity prepped and draped in the standard surgical fashion.    Bony landmarks were palpated and skin incisions were marked.  Posterior shoulder arthroscopy portal incision was made followed by an anterior shoulder arthroscopy portal.  Diagnostic shoulder arthroscopy ensued.  The chondral surfaces were unremarkable.  There was degenerative anterior superior and posterior labral tears.  These were debrided back to stable margins.  The there was severe tendinosis of the biceps tendon.  Biceps tenotomy was performed.  The stump of the biceps was debrided back to stable margins.  There was a fair amount of erythema in the rotator interval.  A full-thickness retracted supraspinatus tear was encountered.  This was retracted back to a few millimeters medial to the articular margin.  The articular surface of the supraspinatus was debrided back to stable margins.  The arthroscope was then repositioned into the subacromial space.  Subdeltoid and subacromial bursectomy was performed.  Subacromial decompression with acromioplasty and CA ligament release was performed.  The bursal surface of the supraspinatus was then debrided back to healthy margins for repair.  A cuff grasper was used and I was able to reduce the supraspinatus back to about 5 mm lateral to the articular margin due to the age of the tear.  The infraspinatus attachment  was intact.  The bone was then prepared with a high-speed bur.  I then performed a double row  repair by using two 2.7 mm all suture anchor as the medial row and two 4.75 mm swivel lock anchors for the lateral row and a 2 x 2 fashion.  I was very happy with the reduction of the supraspinatus back to the bone.  Excess fluid was expressed from the shoulder.  Incisions were closed with nylon.  Sterile dressings were applied.  Shoulder immobilizer with abduction pillow was placed.  Patient tolerated the procedure well had no many complications.  Tessa Lerner, my PA, was a medical necessity for the entirety of the surgery including opening, closing, limb positioning, retracting, exposing, and repairing.  POSTOPERATIVE PLAN: Patient will be discharged home and follow-up in the office in 1 week for suture removal and initiation of physical therapy.  Mayra Reel, MD [facility] 3:09 PM

## 2023-07-10 NOTE — Anesthesia Preprocedure Evaluation (Addendum)
Anesthesia Evaluation  Patient identified by MRN, date of birth, ID band Patient awake    Reviewed: Allergy & Precautions, NPO status , Patient's Chart, lab work & pertinent test results  History of Anesthesia Complications (+) history of anesthetic complications (had chest pain in 2008)  Airway Mallampati: III  TM Distance: >3 FB Neck ROM: Full    Dental  (+) Dental Advisory Given   Pulmonary neg shortness of breath, sleep apnea , neg COPD, neg recent URI, Smoking history: does not use CPAP.   Pulmonary exam normal breath sounds clear to auscultation       Cardiovascular (-) hypertension(-) angina + CAD  (-) Past MI, (-) Cardiac Stents and (-) CABG (-) dysrhythmias  Rhythm:Regular Rate:Normal  HLD   Neuro/Psych neg Seizures PSYCHIATRIC DISORDERS (adjustment disorder)      negative neurological ROS     GI/Hepatic negative GI ROS, Neg liver ROS,neg GERD  ,,  Endo/Other  negative endocrine ROS    Renal/GU negative Renal ROS   BPH    Musculoskeletal  (+) Arthritis ,    Abdominal   Peds  Hematology negative hematology ROS (+)   Anesthesia Other Findings   Reproductive/Obstetrics                             Anesthesia Physical Anesthesia Plan  ASA: 2  Anesthesia Plan: General and Regional   Post-op Pain Management: Regional block* and Tylenol PO (pre-op)*   Induction: Intravenous  PONV Risk Score and Plan: 2 and Ondansetron, Dexamethasone and Treatment may vary due to age or medical condition  Airway Management Planned: Oral ETT  Additional Equipment:   Intra-op Plan:   Post-operative Plan: Extubation in OR  Informed Consent: I have reviewed the patients History and Physical, chart, labs and discussed the procedure including the risks, benefits and alternatives for the proposed anesthesia with the patient or authorized representative who has indicated his/her understanding and  acceptance.     Dental advisory given  Plan Discussed with: CRNA and Anesthesiologist  Anesthesia Plan Comments: (Discussed potential risks of nerve blocks including, but not limited to, infection, bleeding, nerve damage, seizures, pneumothorax, respiratory depression, and potential failure of the block. Alternatives to nerve blocks discussed. All questions answered.  Risks of general anesthesia discussed including, but not limited to, sore throat, hoarse voice, chipped/damaged teeth, injury to vocal cords, nausea and vomiting, allergic reactions, lung infection, heart attack, stroke, and death. All questions answered. )        Anesthesia Quick Evaluation

## 2023-07-11 ENCOUNTER — Encounter (HOSPITAL_BASED_OUTPATIENT_CLINIC_OR_DEPARTMENT_OTHER): Payer: Self-pay | Admitting: Orthopaedic Surgery

## 2023-07-12 ENCOUNTER — Encounter: Payer: Self-pay | Admitting: Cardiology

## 2023-07-12 ENCOUNTER — Telehealth: Payer: Self-pay

## 2023-07-12 NOTE — Telephone Encounter (Signed)
FWD to pharmacy pool for assistance

## 2023-07-12 NOTE — Telephone Encounter (Signed)
Triage call: the patient has shoulder arthroscopy/debridement done 2 days ago. The bandage is to be changed today - he asked about what type/size of bandage needs to be applied. Ok to place bandaids over the incisions. Advised him to get some Tegaderm to put over those, to help keep the incisions dry until he comes in for his postop visit next week. The patient repeated those instructions back to me, voicing understanding. Advised him to contact us with any other issues that may arise.

## 2023-07-17 ENCOUNTER — Encounter: Payer: Self-pay | Admitting: Physical Therapy

## 2023-07-17 ENCOUNTER — Ambulatory Visit: Payer: Medicare Other | Admitting: Physical Therapy

## 2023-07-17 ENCOUNTER — Other Ambulatory Visit: Payer: Self-pay

## 2023-07-17 DIAGNOSIS — M6281 Muscle weakness (generalized): Secondary | ICD-10-CM | POA: Diagnosis not present

## 2023-07-17 DIAGNOSIS — R600 Localized edema: Secondary | ICD-10-CM

## 2023-07-17 DIAGNOSIS — M25512 Pain in left shoulder: Secondary | ICD-10-CM

## 2023-07-17 DIAGNOSIS — M25612 Stiffness of left shoulder, not elsewhere classified: Secondary | ICD-10-CM | POA: Diagnosis not present

## 2023-07-17 NOTE — Therapy (Signed)
OUTPATIENT PHYSICAL THERAPY UPPER EXTREMITY EVALUATION   Patient Name: Douglas Hendricks MRN: [id number] DOB:Apr 29, 1956, 67 y.o., male Today's Date: 07/17/2023  END OF SESSION:  PT End of Session - 07/17/23 1247     Visit Number 1    Number of Visits 15    Date for PT Re-Evaluation 09/06/23    Authorization Type [employer] Medicare    Authorization Time Period $20 copay    Progress Note Due on Visit 10    PT Start Time 1100    PT Stop Time 1144    PT Time Calculation (min) 44 min    Activity Tolerance Patient tolerated treatment well    Behavior During Therapy [facility] for tasks assessed/performed             Past Medical History:  Diagnosis Date   Arthritis    S/p right hip replacement with redo surgery in 2014   BPH (benign prostatic hyperplasia)    Complication of anesthesia    severe chest pain 2008   Hernia, inguinal, left 2017   Hyperlipidemia    Intolerant to statins-myalgias (atorvastatin 20 mg)   OSA (obstructive sleep apnea)    Thoracic stomach hernia 2015   Past Surgical History:  Procedure Laterality Date   ABDOMINAL HERNIA REPAIR  2015   Umbilical   Cataract surgery Bilateral    COLONOSCOPY  10/2019   Repeated every 3 years   hip pain Bilateral    INGUINAL HERNIA REPAIR Left    KNEE SURGERY Left 1997   SHOULDER ARTHROSCOPY WITH ROTATOR CUFF REPAIR AND SUBACROMIAL DECOMPRESSION Left 07/10/2023   Procedure: LEFT SHOULDER ARTHROSCOPY, EXTENSIVE DEBRIDEMENT, SUBACROMIAL DECOMPRESSION, ROTATOR CUFF REPAIR;  Surgeon: Tarry Kos, MD;  Location: [facility];  Service: Orthopedics;  Laterality: Left;   SHOULDER SURGERY     TOTAL HIP ARTHROPLASTY Right 06/29/2013   [facility]-Dr. Everardo Beals (Redo THR)   TYMPANOSTOMY Right 04/2020   Patient Active Problem List   Diagnosis Date Noted   Traumatic tear of supraspinatus tendon, left, initial encounter 07/10/2023   Degenerative tear of glenoid labrum, left 07/10/2023   Adjustment disorder with mixed emotional  features 06/18/2023   Elevated coronary artery calcium score 05/15/2023   Hyperlipidemia LDL goal <70 05/15/2023   Pain in right hip 06/19/2022   Partial tear of left rotator cuff 02/16/2020   Impingement syndrome of left shoulder 02/16/2020   Pain in left shoulder 08/19/2019   Acromioclavicular sprain, left, initial encounter 07/09/2019   Chronic right shoulder pain 08/25/2018   Ventral hernia without obstruction or gangrene 03/18/2014   PCP: [name], MD  REFERRING PROVIDER: Tarry Kos, MD  REFERRING DIAG:  [phone] (ICD-10-CM) - Degenerative tear of glenoid labrum, left  B14.782N (ICD-10-CM) - Traumatic tear of supraspinatus tendon, left, initial encounter   THERAPY DIAG:  No diagnosis found.  Rationale for Evaluation and Treatment: Rehabilitation  ONSET DATE: 07/10/2023 RTC repair surgery  SUBJECTIVE:  SUBJECTIVE STATEMENT: He has had 18 months of left shoulder pain for ~3 years.  Patient underwent left rotator cuff tear repair arthroscopically with extensive debridement & subacromial decompression on 07/10/2023.  Hand dominance: Right  PERTINENT HISTORY: left RTC repair 07/10/23, bilateral THA 2015 & 2014, right RTC repair ~2018, abdominal hernia repair,   PAIN:  Are you having pain? Yes: NPRS scale: today 4/10, over last week 1/10 - 4/10 Pain location: left shoulder more anterior & posterior over muscle areas Pain description: ache Aggravating factors: minor movement like putting shirt on Relieving factors: wearing sling & minimize movement  PRECAUTIONS: Shoulder  RED FLAGS: None   WEIGHT BEARING RESTRICTIONS: No  FALLS:  Has patient fallen in last 6 months? No  LIVING ENVIRONMENT: Lives with: lives alone Lives in: Mobile home loft extra sleeping Stairs: Yes:  External: 0 steps; none  OCCUPATION: Retired  PLOF: Independent  PATIENT GOALS: to use arm to swim, exercise and function with ADLs  NEXT MD VISIT: 07/18/2023  OBJECTIVE:  Note: Objective measures were completed at Evaluation unless otherwise noted.  PATIENT SURVEYS :  FOTO 52% with target 70%  COGNITION: Overall cognitive status: Within functional limits for tasks assessed     SENSATION: WFL  POSTURE: Head forward and rounded shoulder  UPPER EXTREMITY ROM:   Passive ROM Right eval Left eval  Shoulder flexion  69*  Shoulder extension    Shoulder abduction  90*  Shoulder adduction    Shoulder internal rotation  30*  Shoulder external rotation  30*  Elbow flexion    Elbow extension    Wrist flexion    Wrist extension    Wrist ulnar deviation    Wrist radial deviation    Wrist pronation    Wrist supination    (Blank rows = not tested)  UPPER EXTREMITY MMT: Left shoulder MMT Not Tested at eval due to time since sg  MMT    Shoulder flexion    Shoulder extension    Shoulder abduction    Shoulder adduction    Shoulder internal rotation    Shoulder external rotation    Middle trapezius    Lower trapezius    Elbow flexion    Elbow extension    Wrist flexion    Wrist extension    Wrist ulnar deviation    Wrist radial deviation    Wrist pronation    Wrist supination    Grip strength (lbs)    (Blank rows = not tested)  PALPATION:  Tenderness over pectoralis, upper trapezius, middle traps, levator scapulae and biceps    TODAY'S TREATMENT:                                                                                                       DATE: Therex: HEP instruction/performance c cues for techniques, handout provided.  Trial set performed of each for comprehension and symptom assessment.  See below for exercise list PT verbal & demo cues on positioning sitting, supine / bed and sling wear when upright.  PT demo on showering & applying deodorant per pt  request. Pt verbalized understanding.    PATIENT EDUCATION: Education details: HEP, POC Person educated: Patient Education method: Programmer, multimedia, Demonstration, Verbal cues, and Handouts Education comprehension: verbalized understanding, returned demonstration, and verbal cues required  HOME EXERCISE PROGRAM: Access Code: [email or web address] URL: [email or web address] Date: 07/17/2023 Prepared by: Vladimir Faster  Exercises - Supine Chin Tuck  - 2 x daily - 7 x weekly - 2 sets - 10 reps - 5 seconds hold - Supine Scapular Retraction  - 2 x daily - 7 x weekly - 2 sets - 10 reps - 5 seconds hold - Seated Scapular Retraction  - 2 x daily - 7 x weekly - 2 sets - 10 reps - 5 seconds hold - Seated Cervical Retraction  - 2 x daily - 7 x weekly - 2 sets - 10 reps - 5 seconds hold - Flexion-Extension Shoulder Pendulum with Table Support  - 3-5 x daily - 7 x weekly - 2 sets - 10 reps - Horizontal Shoulder Pendulum with Table Support  - 3-5 x daily - 7 x weekly - 2 sets - 10 reps - Circular Shoulder Pendulum with Table Support  - 3-5 x daily - 7 x weekly - 2 sets - 10 reps - Standing Circular Shoulder Pendulum Supported with Arm Bent  - 3-5 x daily - 7 x weekly - 2 sets - 10 reps - Standing Horizontal Shoulder Pendulum Supported with Arm Bent  - 3-5 x daily - 7 x weekly - 2 sets - 10 reps - Standing Flexion Extension Shoulder Pendulum Supported with Arm Bent  - 3-5 x daily - 7 x weekly - 2 sets - 10 reps  ASSESSMENT:  CLINICAL IMPRESSION: Patient is a 67 y.o. who comes to clinic with complaints of left shoulder pain s/p rotator cuff repair surgery with mobility, strength and movement coordination deficits that impair their ability to perform usual daily and recreational functional activities without increase difficulty/symptoms at this time.  Patient to benefit from skilled PT services to address impairments and limitations to improve to previous level of function without restriction secondary to  condition.   OBJECTIVE IMPAIRMENTS: decreased activity tolerance, decreased knowledge of condition, decreased ROM, decreased strength, increased edema, impaired flexibility, impaired UE functional use, and pain.   ACTIVITY LIMITATIONS: carrying, lifting, bed mobility, reach over head, and exercise  PARTICIPATION LIMITATIONS: cleaning and laundry  PERSONAL FACTORS: Past/current experiences and 1-2 comorbidities: see PMH  are also affecting patient's functional outcome.   REHAB POTENTIAL: Good  CLINICAL DECISION MAKING: Stable/uncomplicated  EVALUATION COMPLEXITY: Low   GOALS: Goals reviewed with patient? Yes  SHORT TERM GOALS: (target date for Short term goals are 3 weeks 08/07/2023)  1.Patient will demonstrate independent use of home exercise program to maintain progress from in clinic treatments. Goal status: New  LONG TERM GOALS: (target dates for all long term goals are 8 weeks  09/06/2023)   1. Patient will demonstrate/report pain at worst less than or equal to 2/10 to facilitate minimal limitation in daily activity secondary to pain symptoms. Goal status: New   2. Patient will demonstrate independent use of home exercise program to facilitate ability to maintain/progress functional gains from skilled physical therapy services. Goal status: New   3. Patient will demonstrate FOTO outcome > or = 70 % to indicate reduced disability due to condition. Goal status: New   4.  Patient will demonstrate left shoulder UE MMT 5/5 throughout to facilitate lifting, reaching, carrying at [facility] in daily activity.   Goal status: New  5.  Patient will demonstrate left GH joint AROM WFL s symptoms to facilitate usual overhead reaching, self care, dressing at PLOF.    Goal status: New   6.  Patient able to lift 2# to top shelf of upper cabinet.  Goal status: New   7.  patient reports able to use LUE with swimming and other exercise equipment per his goal. Goal Status:  New    PLAN: PT FREQUENCY: 1-2x/week  PT DURATION: 8 weeks  PLANNED INTERVENTIONS: 97110-Therapeutic exercises, 97530- Therapeutic activity, 97112- Neuromuscular re-education, 97535- Self Care, 16109- Manual therapy, 97016- Vasopneumatic device, Patient/Family education, Taping, Joint mobilization, Scar mobilization, Cryotherapy, and Moist heat  PLAN FOR NEXT SESSION: PROM left shoulder, check & update HEP, check MD note,  vaso for edema as noted. Self-care instruction as indicated by protocol for shoulder activity level.   Date of referral: 07/10/2023 Referring provider: Tarry Kos, MD Referring diagnosis?  M24.112 (ICD-10-CM) - Degenerative tear of glenoid labrum, left  U04.540J (ICD-10-CM) - Traumatic tear of supraspinatus tendon, left, initial encounter   Treatment diagnosis? (if different than referring diagnosis)     1.   Acute pain of left shoulder       ICD-10-CM: M25.512         2.   Stiffness of left shoulder, not elsewhere classified       ICD-10-CM: M25.612         3.   Muscle weakness (generalized)       ICD-10-CM: M62.81        4.   Localized edema       ICD-10-CM: R60.0         What was this (referring dx) caused by? Surgery (Type: rotator cuff tear repair)  Ashby Dawes of Condition: Initial Onset (within last 3 months)   Laterality: Lt  Current Functional Measure Score: FOTO 52%  Objective measurements identify impairments when they are compared to normal values, the uninvolved extremity, and prior level of function.  [x]  Yes  []  No  Objective assessment of functional ability: Moderate functional limitations   Briefly describe symptoms: pain, limited PROM, unable to use muscles of shoulder due to time since surgery  How did symptoms start: pain & weakness  Average pain intensity:  Last 24 hours: 4/10  Past week: 4/10  How often does the pt experience symptoms? Frequently  How much have the symptoms interfered with usual daily  activities? Quite a bit  How has condition changed since care began at this facility? NA - initial visit  In general, how is the patients overall health? Very Good   BACK PAIN (STarT Back Screening Tool) No   Vladimir Faster, PT, DPT 07/17/2023, 2:22 PM

## 2023-07-18 ENCOUNTER — Ambulatory Visit: Payer: Medicare Other | Admitting: Physician Assistant

## 2023-07-18 ENCOUNTER — Encounter: Payer: Self-pay | Admitting: Physician Assistant

## 2023-07-18 DIAGNOSIS — Z9889 Other specified postprocedural states: Secondary | ICD-10-CM

## 2023-07-18 NOTE — Progress Notes (Signed)
Post-Op Visit Note   Patient: Douglas Hendricks           Date of Birth: [birth year]           MRN: [id number] Visit Date: 07/18/2023 PCP: [name], MD   Assessment & Plan:  Chief Complaint:  Chief Complaint  Patient presents with   Left Shoulder - Follow-up    Left shoulder scope 07/10/2023   Visit Diagnoses:  1. S/P left rotator cuff repair     Plan: Patient is a very pleasant 67 year old gentleman who comes in today 1 week status post left shoulder arthroscopic debridement rotator cuff repair 07/10/2023.  He has been doing well.  He is taking Tylenol and NSAIDs for pain.  He has been compliant wearing his sling at all times.  He started outpatient physical therapy yesterday.  Examination of the left shoulder reveals fully healed surgical portals.  The lateral portal is little erythematous but no signs of infection or cellulitis.  Looks more to me like irritation from the nylon.  Today, portal sutures were removed and Steri-Strips applied.  He will continue wearing the sling for the next 5 weeks.  Continue with physical therapy.  Follow-up in 5 weeks for recheck.  Call with concerns or questions.  Follow-Up Instructions: Return in about 5 weeks (around 08/22/2023).   Orders:  No orders of the defined types were placed in this encounter.  No orders of the defined types were placed in this encounter.   Imaging: No new imaging  PMFS History: Patient Active Problem List   Diagnosis Date Noted   Traumatic tear of supraspinatus tendon, left, initial encounter 07/10/2023   Degenerative tear of glenoid labrum, left 07/10/2023   Adjustment disorder with mixed emotional features 06/18/2023   Elevated coronary artery calcium score 05/15/2023   Hyperlipidemia LDL goal <70 05/15/2023   Pain in right hip 06/19/2022   Partial tear of left rotator cuff 02/16/2020   Impingement syndrome of left shoulder 02/16/2020   Pain in left shoulder 08/19/2019   Acromioclavicular sprain,  left, initial encounter 07/09/2019   Chronic right shoulder pain 08/25/2018   Ventral hernia without obstruction or gangrene 03/18/2014   Past Medical History:  Diagnosis Date   Arthritis    S/p right hip replacement with redo surgery in 2014   BPH (benign prostatic hyperplasia)    Complication of anesthesia    severe chest pain 2008   Hernia, inguinal, left 2017   Hyperlipidemia    Intolerant to statins-myalgias (atorvastatin 20 mg)   OSA (obstructive sleep apnea)    Thoracic stomach hernia 2015    Family History  Problem Relation Age of Onset   Cancer Mother        pancreatic   COPD Father    Cancer Father        esophageal   Ovarian cancer Sister        In remission x 15 years   Throat cancer Sister    Healthy Sister    Valvular heart disease Brother        Heart valve surgery   Hyperlipidemia Brother    Other Brother        Low back pain from spinal:/Spinal stenosis   Glaucoma Brother     Past Surgical History:  Procedure Laterality Date   ABDOMINAL HERNIA REPAIR  2015   Umbilical   Cataract surgery Bilateral    COLONOSCOPY  10/2019   Repeated every 3 years   hip pain Bilateral  INGUINAL HERNIA REPAIR Left    KNEE SURGERY Left 1997   SHOULDER ARTHROSCOPY WITH ROTATOR CUFF REPAIR AND SUBACROMIAL DECOMPRESSION Left 07/10/2023   Procedure: LEFT SHOULDER ARTHROSCOPY, EXTENSIVE DEBRIDEMENT, SUBACROMIAL DECOMPRESSION, ROTATOR CUFF REPAIR;  Surgeon: Tarry Kos, MD;  [facility];  Service: Orthopedics;  Laterality: Left;   SHOULDER SURGERY     TOTAL HIP ARTHROPLASTY Right 06/29/2013   [facility]-Dr. Everardo Beals (Redo THR)   TYMPANOSTOMY Right 04/2020   Social History   Occupational History   Not on file  Tobacco Use   Smoking status: Never   Smokeless tobacco: Never   Tobacco comments:    Drinks 2 12 ounce servings of coffee a day.  Vaping Use   Vaping status: Never Used  Substance and Sexual Activity   Alcohol use: Yes     Alcohol/week: 4.0 standard drinks of alcohol    Types: 4 Standard drinks or equivalent per week    Comment: 1-2   Drug use: No   Sexual activity: Not on file

## 2023-07-22 ENCOUNTER — Ambulatory Visit (INDEPENDENT_AMBULATORY_CARE_PROVIDER_SITE_OTHER): Payer: Medicare Other | Admitting: Physical Therapy

## 2023-07-22 ENCOUNTER — Other Ambulatory Visit (HOSPITAL_COMMUNITY)
Admission: RE | Admit: 2023-07-22 | Discharge: 2023-07-22 | Disposition: A | Discharge status: Home / Self Care | Payer: Medicare Other | Source: Ambulatory Visit | Attending: Oncology | Admitting: Oncology

## 2023-07-22 ENCOUNTER — Ambulatory Visit: Payer: Medicare Other | Admitting: Licensed Clinical Social Worker

## 2023-07-22 ENCOUNTER — Encounter: Payer: Self-pay | Admitting: Physical Therapy

## 2023-07-22 DIAGNOSIS — R600 Localized edema: Secondary | ICD-10-CM | POA: Diagnosis not present

## 2023-07-22 DIAGNOSIS — M25612 Stiffness of left shoulder, not elsewhere classified: Secondary | ICD-10-CM

## 2023-07-22 DIAGNOSIS — M25512 Pain in left shoulder: Secondary | ICD-10-CM

## 2023-07-22 DIAGNOSIS — M6281 Muscle weakness (generalized): Secondary | ICD-10-CM

## 2023-07-22 DIAGNOSIS — Z006 Encounter for examination for normal comparison and control in clinical research program: Secondary | ICD-10-CM | POA: Insufficient documentation

## 2023-07-22 DIAGNOSIS — F4325 Adjustment disorder with mixed disturbance of emotions and conduct: Secondary | ICD-10-CM

## 2023-07-22 DIAGNOSIS — F4329 Adjustment disorder with other symptoms: Secondary | ICD-10-CM

## 2023-07-22 NOTE — Therapy (Signed)
OUTPATIENT PHYSICAL THERAPY UPPER EXTREMITY EVALUATION   Patient Name: Douglas Hendricks MRN: [id number] DOB:03/31/1956, 67 y.o., male Today's Date: 07/22/2023  END OF SESSION:  PT End of Session - 07/22/23 1424     Visit Number 2    Number of Visits 15    Date for PT Re-Evaluation 09/06/23    Authorization Type [employer]    Progress Note Due on Visit 10    PT Start Time 1345    PT Stop Time 1423    PT Time Calculation (min) 38 min    Activity Tolerance Patient tolerated treatment well    Behavior During Therapy WFL for tasks assessed/performed              Past Medical History:  Diagnosis Date   Arthritis    S/p right hip replacement with redo surgery in 2014   BPH (benign prostatic hyperplasia)    Complication of anesthesia    severe chest pain 2008   Hernia, inguinal, left 2017   Hyperlipidemia    Intolerant to statins-myalgias (atorvastatin 20 mg)   OSA (obstructive sleep apnea)    Thoracic stomach hernia 2015   Past Surgical History:  Procedure Laterality Date   ABDOMINAL HERNIA REPAIR  2015   Umbilical   Cataract surgery Bilateral    COLONOSCOPY  10/2019   Repeated every 3 years   hip pain Bilateral    INGUINAL HERNIA REPAIR Left    KNEE SURGERY Left 1997   SHOULDER ARTHROSCOPY WITH ROTATOR CUFF REPAIR AND SUBACROMIAL DECOMPRESSION Left 07/10/2023   Procedure: LEFT SHOULDER ARTHROSCOPY, EXTENSIVE DEBRIDEMENT, SUBACROMIAL DECOMPRESSION, ROTATOR CUFF REPAIR;  Surgeon: Tarry Kos, MD;  Location: [facility];  Service: Orthopedics;  Laterality: Left;   SHOULDER SURGERY     TOTAL HIP ARTHROPLASTY Right 06/29/2013   [facility]-Dr. Everardo Beals (Redo THR)   TYMPANOSTOMY Right 04/2020   Patient Active Problem List   Diagnosis Date Noted   Traumatic tear of supraspinatus tendon, left, initial encounter 07/10/2023   Degenerative tear of glenoid labrum, left 07/10/2023   Adjustment disorder with mixed emotional features 06/18/2023   Elevated coronary  artery calcium score 05/15/2023   Hyperlipidemia LDL goal <70 05/15/2023   Pain in right hip 06/19/2022   Partial tear of left rotator cuff 02/16/2020   Impingement syndrome of left shoulder 02/16/2020   Pain in left shoulder 08/19/2019   Acromioclavicular sprain, left, initial encounter 07/09/2019   Chronic right shoulder pain 08/25/2018   Ventral hernia without obstruction or gangrene 03/18/2014   PCP: [name], MD  REFERRING PROVIDER: Tarry Kos, MD  REFERRING DIAG:  [phone] (ICD-10-CM) - Degenerative tear of glenoid labrum, left  I69.629B (ICD-10-CM) - Traumatic tear of supraspinatus tendon, left, initial encounter   THERAPY DIAG:  No diagnosis found.  Rationale for Evaluation and Treatment: Rehabilitation  ONSET DATE: 07/10/2023 RTC repair surgery  SUBJECTIVE:  SUBJECTIVE STATEMENT: Pt arriving today reporting 1-2/10 pain in his left shoulder. Pt reporting he is pleased after performing his HEP and the passive range of motion he has.     Eval:  He has had 18 months of left shoulder pain for ~3 years.  Patient underwent left rotator cuff tear repair arthroscopically with extensive debridement & subacromial decompression on 07/10/2023.  Hand dominance: Right  PERTINENT HISTORY: left RTC repair 07/10/23, bilateral THA 2015 & 2014, right RTC repair ~2018, abdominal hernia repair,   PAIN:  Are you having pain? Yes: NPRS scale: today 4/10, over last week 1/10 - 4/10 Pain location: left shoulder more anterior & posterior over muscle areas Pain description: ache Aggravating factors: minor movement like putting shirt on Relieving factors: wearing sling & minimize movement  PRECAUTIONS: Shoulder  RED FLAGS: None   WEIGHT BEARING RESTRICTIONS: No  FALLS:  Has patient fallen in  last 6 months? No  LIVING ENVIRONMENT: Lives with: lives alone Lives in: Mobile home loft extra sleeping Stairs: Yes: External: 0 steps; none  OCCUPATION: Retired  PLOF: Independent  PATIENT GOALS: to use arm to swim, exercise and function with ADLs  NEXT MD VISIT: 07/18/2023  OBJECTIVE:  Note: Objective measures were completed at Evaluation unless otherwise noted.  PATIENT SURVEYS :  FOTO 52% with target 70%  COGNITION: Overall cognitive status: Within functional limits for tasks assessed     SENSATION: WFL  POSTURE: Head forward and rounded shoulder  UPPER EXTREMITY ROM:   Passive ROM Right eval Left eval  Shoulder flexion  69*  Shoulder extension    Shoulder abduction  90*  Shoulder adduction    Shoulder internal rotation  30*  Shoulder external rotation  30*  Elbow flexion    Elbow extension    Wrist flexion    Wrist extension    Wrist ulnar deviation    Wrist radial deviation    Wrist pronation    Wrist supination    (Blank rows = not tested)  UPPER EXTREMITY MMT: Left shoulder MMT Not Tested at eval due to time since sg  MMT    Shoulder flexion    Shoulder extension    Shoulder abduction    Shoulder adduction    Shoulder internal rotation    Shoulder external rotation    Middle trapezius    Lower trapezius    Elbow flexion    Elbow extension    Wrist flexion    Wrist extension    Wrist ulnar deviation    Wrist radial deviation    Wrist pronation    Wrist supination    Grip strength (lbs)    (Blank rows = not tested)  PALPATION:  Tenderness over pectoralis, upper trapezius, middle traps, levator scapulae and biceps    TODAY'S TREATMENT:                                                                                                       DATE: 07/22/23:  TherEx:  Pendulums: flexion/extension, abd/add, circles both directions x 2 minutes Seated passive flexion in cradle postion  using Rt UE x 10  Flexion table slides: x 10 holding  10 sec Scaption table slides x 10 holding 10 sec ER table slides x 10 holding 5 sec Scapular retraction x 10 holding 5 sec Manual:  PROM to pt's tolerance to left shoulder   Eval:  Therex: HEP instruction/performance c cues for techniques, handout provided.  Trial set performed of each for comprehension and symptom assessment.  See below for exercise list PT verbal & demo cues on positioning sitting, supine / bed and sling wear when upright.  PT demo on showering & applying deodorant per pt request. Pt verbalized understanding.    PATIENT EDUCATION: Education details: HEP, POC Person educated: Patient Education method: Programmer, multimedia, Demonstration, Verbal cues, and Handouts Education comprehension: verbalized understanding, returned demonstration, and verbal cues required  HOME EXERCISE PROGRAM: Access Code: [email or web address] URL: [email or web address] Date: 07/22/2023 Prepared by: Narda Amber  Exercises - Supine Chin Tuck  - 2 x daily - 7 x weekly - 2 sets - 10 reps - 5 seconds hold - Supine Scapular Retraction  - 2 x daily - 7 x weekly - 2 sets - 10 reps - 5 seconds hold - Seated Scapular Retraction  - 2 x daily - 7 x weekly - 2 sets - 10 reps - 5 seconds hold - Seated Cervical Retraction  - 2 x daily - 7 x weekly - 2 sets - 10 reps - 5 seconds hold - Flexion-Extension Shoulder Pendulum with Table Support  - 3-5 x daily - 7 x weekly - 2 sets - 10 reps - Horizontal Shoulder Pendulum with Table Support  - 3-5 x daily - 7 x weekly - 2 sets - 10 reps - Circular Shoulder Pendulum with Table Support  - 3-5 x daily - 7 x weekly - 2 sets - 10 reps - Standing Circular Shoulder Pendulum Supported with Arm Bent  - 3-5 x daily - 7 x weekly - 2 sets - 10 reps - Standing Horizontal Shoulder Pendulum Supported with Arm Bent  - 3-5 x daily - 7 x weekly - 2 sets - 10 reps - Standing Flexion Extension Shoulder Pendulum Supported with Arm Bent  - 3-5 x daily - 7 x weekly - 2 sets - 10  reps - Seated Shoulder Flexion Slide at Table Top with Forearm in Neutral  - 1-2 x daily - 7 x weekly - 1-2 sets - 10 reps - 5-10 seconds hold - Seated Shoulder Scaption Slide at Table Top with Forearm in Neutral  - 1-2 x daily - 7 x weekly - 1-2 sets - 10 reps - 5-10 seconds hold - Seated Shoulder External Rotation PROM on Table  - 1-2 x daily - 7 x weekly - 1-2 sets - 10 reps - 5-10 seconds hold  ASSESSMENT:  CLINICAL IMPRESSION: Pt  tolerating all exercises well. Pt's HEP reviewed and pt able to demonstrate independence. Continue skilled PT interventions per shoulder protocol.    OBJECTIVE IMPAIRMENTS: decreased activity tolerance, decreased knowledge of condition, decreased ROM, decreased strength, increased edema, impaired flexibility, impaired UE functional use, and pain.   ACTIVITY LIMITATIONS: carrying, lifting, bed mobility, reach over head, and exercise  PARTICIPATION LIMITATIONS: cleaning and laundry  PERSONAL FACTORS: Past/current experiences and 1-2 comorbidities: see PMH  are also affecting patient's functional outcome.   REHAB POTENTIAL: Good  CLINICAL DECISION MAKING: Stable/uncomplicated  EVALUATION COMPLEXITY: Low   GOALS: Goals reviewed with patient? Yes  SHORT TERM GOALS: (target date for Short term goals are 3 weeks 08/07/2023)  1.Patient will demonstrate independent use of home exercise program to maintain progress from in clinic treatments. Goal status: on-going 07/22/23  LONG TERM GOALS: (target dates for all long term goals are 8 weeks  09/06/2023)   1. Patient will demonstrate/report pain at worst less than or equal to 2/10 to facilitate minimal limitation in daily activity secondary to pain symptoms. Goal status: New   2. Patient will demonstrate independent use of home exercise program to facilitate ability to maintain/progress functional gains from skilled physical therapy services. Goal status: New   3. Patient will demonstrate FOTO outcome > or =  70 % to indicate reduced disability due to condition. Goal status: New   4.  Patient will demonstrate left shoulder UE MMT 5/5 throughout to facilitate lifting, reaching, carrying at [facility] in daily activity.   Goal status: New   5.  Patient will demonstrate left GH joint AROM WFL s symptoms to facilitate usual overhead reaching, self care, dressing at PLOF.    Goal status: New   6.  Patient able to lift 2# to top shelf of upper cabinet.  Goal status: New   7.  patient reports able to use LUE with swimming and other exercise equipment per his goal. Goal Status: New    PLAN: PT FREQUENCY: 1-2x/week  PT DURATION: 8 weeks  PLANNED INTERVENTIONS: 97110-Therapeutic exercises, 97530- Therapeutic activity, 97112- Neuromuscular re-education, 97535- Self Care, 16109- Manual therapy, 97016- Vasopneumatic device, Patient/Family education, Taping, Joint mobilization, Scar mobilization, Cryotherapy, and Moist heat  PLAN FOR NEXT SESSION: PROM left shoulder, check & update HEP, check MD note,  vaso for edema as noted. Self-care instruction as indicated by protocol for shoulder activity level.   Date of referral: 07/10/2023 Referring provider: Tarry Kos, MD Referring diagnosis?  M24.112 (ICD-10-CM) - Degenerative tear of glenoid labrum, left  U04.540J (ICD-10-CM) - Traumatic tear of supraspinatus tendon, left, initial encounter   Treatment diagnosis? (if different than referring diagnosis)     1.   Acute pain of left shoulder       ICD-10-CM: M25.512         2.   Stiffness of left shoulder, not elsewhere classified       ICD-10-CM: M25.612         3.   Muscle weakness (generalized)       ICD-10-CM: M62.81        4.   Localized edema       ICD-10-CM: R60.0         What was this (referring dx) caused by? Surgery (Type: rotator cuff tear repair)  Ashby Dawes of Condition: Initial Onset (within last 3 months)   Laterality: Lt  Current Functional Measure Score: FOTO  52%  Objective measurements identify impairments when they are compared to normal values, the uninvolved extremity, and prior level of function.  [x]  Yes  []  No  Objective assessment of functional ability: Moderate functional limitations   Briefly describe symptoms: pain, limited PROM, unable to use muscles of shoulder due to time since surgery  How did symptoms start: pain & weakness  Average pain intensity:  Last 24 hours: 4/10  Past week: 4/10  How often does the pt experience symptoms? Frequently  How much have the symptoms interfered with usual daily activities? Quite a bit  How has condition changed since care began at this facility? NA - initial visit  In general, how is the patients overall health? Very Good   BACK PAIN (STarT Back Screening Tool) No  Sharmon Leyden, PT, MPT 07/22/2023, 2:25 PM

## 2023-07-22 NOTE — Progress Notes (Signed)
[facility] Counselor/Therapist Progress Note  Patient ID: Douglas Hendricks, MRN: [id number]    Date: 07/22/23  Time Spent: 0402  pm - 0502 pm : 60 Minutes  Treatment Type: Individual Therapy.  Reported Symptoms: Relationship issues due to inability to express emotions  Mental Status Exam: Appearance:  Casual     Behavior: Appropriate  Motor: Normal  Speech/Language:  Clear and Coherent  Affect: Appropriate  Mood: normal  Thought process: normal  Thought content:   WNL  Sensory/Perceptual disturbances:   WNL  Orientation: oriented to person, place, time/date, situation, day of week, month of year, and year  Attention: Good  Concentration: Good  Memory: WNL  Fund of knowledge:  Good  Insight:   Good  Judgment:  Good  Impulse Control: Good   Risk Assessment: Danger to Self:  No Self-injurious Behavior: No Danger to Others: No Duty to Warn:no Physical Aggression / Violence:No  Access to Firearms a concern: No  Gang Involvement:No   Subjective:   Alix Dedios and his girlfriend Camera operator) participated from office, located at [employer] with Clinician present. Homero Fellers and [name] consented to treatment.   Both partners presented for the session admitting they feel a disconnect in their relationship. [name] reports feeling unappreciated and not relevant. Midas reports feeling scared to be transparent about his feelings and avoids conflict. Both acknowledge strong feelings for each other but uncertain how to navigate the gap in their communication.Ailton was often tearful and admitted the need to communicate more effectively.   Interventions: Cognitive Behavioral Therapy  Diagnosis: Adjustment Disorder with mixed emotional features.   Plan: Avrumy will use CBT, mindfulness and coping skills to help manage decrease symptoms associated with their diagnosis.   Long-term goal:   Syier will reduce overall level, frequency, and intensity of the feelings of  depression, anxiety  evidenced by       decreased irritability, better communication with his partner, negative self talk, and helpless feelings from 6 to 7 days/week to 0 to 2 days/week per client report for at least 3 consecutive months. Treatment plan to be reviewed by 06/17/2024.  Short-term goal:  Sire will verbally express understanding of the relationship between feelings of depression, anxiety and their impact on thinking patterns, relationships and behaviors. Verbalize an understanding of the role that distorted thinking plays in creating fears, excessive worry, and ruminations.  Phyllis Ginger MSW, LCSW DATE: 07/22/2023

## 2023-07-23 ENCOUNTER — Encounter: Payer: Medicare Other | Admitting: Physical Therapy

## 2023-07-29 ENCOUNTER — Encounter: Payer: Self-pay | Admitting: Cardiology

## 2023-07-29 DIAGNOSIS — R002 Palpitations: Secondary | ICD-10-CM

## 2023-07-29 DIAGNOSIS — I499 Cardiac arrhythmia, unspecified: Secondary | ICD-10-CM

## 2023-07-30 ENCOUNTER — Ambulatory Visit: Payer: Medicare Other | Admitting: Physical Therapy

## 2023-07-30 ENCOUNTER — Encounter: Payer: Self-pay | Admitting: Physical Therapy

## 2023-07-30 DIAGNOSIS — M25612 Stiffness of left shoulder, not elsewhere classified: Secondary | ICD-10-CM

## 2023-07-30 DIAGNOSIS — M6281 Muscle weakness (generalized): Secondary | ICD-10-CM

## 2023-07-30 DIAGNOSIS — M25512 Pain in left shoulder: Secondary | ICD-10-CM

## 2023-07-30 DIAGNOSIS — R600 Localized edema: Secondary | ICD-10-CM

## 2023-07-30 NOTE — Telephone Encounter (Signed)
The question would be how often are they occurring.  If they are happening several times a week, we can consider placing an event monitor that he can wear prior to his visit.  Would probably do 2-week monitor.  Bryan Lemma, MD

## 2023-07-30 NOTE — Therapy (Signed)
OUTPATIENT PHYSICAL THERAPY UPPER EXTREMITY TREATMENT   Patient Name: Douglas Hendricks MRN: [id number] DOB:1956/02/06, 67 y.o., male Today's Date: 07/30/2023  END OF SESSION:  PT End of Session - 07/30/23 1019     Visit Number 3    Number of Visits 15    Date for PT Re-Evaluation 09/06/23    Authorization Type [employer] Medicare    Progress Note Due on Visit 10    PT Start Time 1018    PT Stop Time 1056    PT Time Calculation (min) 38 min    Activity Tolerance Patient tolerated treatment well    Behavior During Therapy WFL for tasks assessed/performed               Past Medical History:  Diagnosis Date   Arthritis    S/p right hip replacement with redo surgery in 2014   BPH (benign prostatic hyperplasia)    Complication of anesthesia    severe chest pain 2008   Hernia, inguinal, left 2017   Hyperlipidemia    Intolerant to statins-myalgias (atorvastatin 20 mg)   OSA (obstructive sleep apnea)    Thoracic stomach hernia 2015   Past Surgical History:  Procedure Laterality Date   ABDOMINAL HERNIA REPAIR  2015   Umbilical   Cataract surgery Bilateral    COLONOSCOPY  10/2019   Repeated every 3 years   hip pain Bilateral    INGUINAL HERNIA REPAIR Left    KNEE SURGERY Left 1997   SHOULDER ARTHROSCOPY WITH ROTATOR CUFF REPAIR AND SUBACROMIAL DECOMPRESSION Left 07/10/2023   Procedure: LEFT SHOULDER ARTHROSCOPY, EXTENSIVE DEBRIDEMENT, SUBACROMIAL DECOMPRESSION, ROTATOR CUFF REPAIR;  Surgeon: Tarry Kos, MD;  Location: [facility];  Service: Orthopedics;  Laterality: Left;   SHOULDER SURGERY     TOTAL HIP ARTHROPLASTY Right 06/29/2013   [facility]-Dr. Everardo Beals (Redo THR)   TYMPANOSTOMY Right 04/2020   Patient Active Problem List   Diagnosis Date Noted   Traumatic tear of supraspinatus tendon, left, initial encounter 07/10/2023   Degenerative tear of glenoid labrum, left 07/10/2023   Adjustment disorder with mixed emotional features 06/18/2023   Elevated coronary  artery calcium score 05/15/2023   Hyperlipidemia LDL goal <70 05/15/2023   Pain in right hip 06/19/2022   Partial tear of left rotator cuff 02/16/2020   Impingement syndrome of left shoulder 02/16/2020   Pain in left shoulder 08/19/2019   Acromioclavicular sprain, left, initial encounter 07/09/2019   Chronic right shoulder pain 08/25/2018   Ventral hernia without obstruction or gangrene 03/18/2014   PCP: [name], MD  REFERRING PROVIDER: Tarry Kos, MD  REFERRING DIAG:  [phone] (ICD-10-CM) - Degenerative tear of glenoid labrum, left  E95.284X (ICD-10-CM) - Traumatic tear of supraspinatus tendon, left, initial encounter   THERAPY DIAG:  No diagnosis found.  Rationale for Evaluation and Treatment: Rehabilitation  ONSET DATE: 07/10/2023 RTC repair surgery  SUBJECTIVE:  SUBJECTIVE STATEMENT: He has been wearing sling.    Eval:  He has had 18 months of left shoulder pain for ~3 years.  Patient underwent left rotator cuff tear repair arthroscopically with extensive debridement & subacromial decompression on 07/10/2023.  Hand dominance: Right  PERTINENT HISTORY: left RTC repair 07/10/23, bilateral THA 2015 & 2014, right RTC repair ~2018, abdominal hernia repair,   PAIN:  Are you having pain? Yes: NPRS scale: today 4/10, over last week 1/10 - 4/10 Pain location: left shoulder more anterior & posterior over muscle areas Pain description: ache Aggravating factors: minor movement like putting shirt on Relieving factors: wearing sling & minimize movement  PRECAUTIONS: Shoulder  RED FLAGS: None   WEIGHT BEARING RESTRICTIONS: No  FALLS:  Has patient fallen in last 6 months? No  LIVING ENVIRONMENT: Lives with: lives alone Lives in: Mobile home loft extra sleeping Stairs: Yes: External:  0 steps; none  OCCUPATION: Retired  PLOF: Independent  PATIENT GOALS: to use arm to swim, exercise and function with ADLs  NEXT MD VISIT: 07/18/2023  OBJECTIVE:  Note: Objective measures were completed at Evaluation unless otherwise noted.  PATIENT SURVEYS :  FOTO 52% with target 70%  COGNITION: Overall cognitive status: Within functional limits for tasks assessed     SENSATION: WFL  POSTURE: Head forward and rounded shoulder  UPPER EXTREMITY ROM:   Passive ROM Left eval Left 07/30/23  Shoulder flexion 69* 149*  Shoulder extension    Shoulder abduction 90* 119*  Shoulder adduction    Shoulder internal rotation 30* 55*  Shoulder external rotation 30* 48*  Elbow flexion    Elbow extension    Wrist flexion    Wrist extension    Wrist ulnar deviation    Wrist radial deviation    Wrist pronation    Wrist supination    (Blank rows = not tested)  UPPER EXTREMITY MMT: Left shoulder MMT Not Tested at eval due to time since sg  MMT    Shoulder flexion    Shoulder extension    Shoulder abduction    Shoulder adduction    Shoulder internal rotation    Shoulder external rotation    Middle trapezius    Lower trapezius    Elbow flexion    Elbow extension    Wrist flexion    Wrist extension    Wrist ulnar deviation    Wrist radial deviation    Wrist pronation    Wrist supination    Grip strength (lbs)    (Blank rows = not tested)  PALPATION:  Tenderness over pectoralis, upper trapezius, middle traps, levator scapulae and biceps    TODAY'S TREATMENT:                                                                                                       DATE: 07/30/2023:  TherEx:  Pt return demo HEP with PT minor corrections.  PT recommended following shoulder with head/eyes to facilitate natural upper body motions.  Pendulums: flexion/extension, abd/add, circles both directions x 2 minutes Standing passive flexion in cradle postion using  Rt UE x 10  Standing  passive abduction / adduction in cradle postion using Rt UE x 10  Standing passive circles CW & CCW in cradle postion using Rt UE x 10  Flexion table slides: x 10 holding 10 sec, alternating reps with hand position from pronation to neutral forearm rotation Scaption table slides x 10 holding 10 sec, alternating reps with hand position from pronation to neutral forearm rotation abduction table slides x 10 holding 10 sec, alternating reps with hand position from pronation to neutral forearm rotation ER table slides x 10 holding 5 sec Seated Scapular retraction x 10 holding 5 sec Seated cervical retraction X 10 holding 5 sec Manual:  PROM to pt's tolerance to left shoulder   TREATMENT:                                                                                                       DATE: 07/22/23:  TherEx:  Pendulums: flexion/extension, abd/add, circles both directions x 2 minutes Seated passive flexion in cradle postion using Rt UE x 10  Flexion table slides: x 10 holding 10 sec Scaption table slides x 10 holding 10 sec ER table slides x 10 holding 5 sec Scapular retraction x 10 holding 5 sec Manual:  PROM to pt's tolerance to left shoulder   Eval:  Therex: HEP instruction/performance c cues for techniques, handout provided.  Trial set performed of each for comprehension and symptom assessment.  See below for exercise list PT verbal & demo cues on positioning sitting, supine / bed and sling wear when upright.  PT demo on showering & applying deodorant per pt request. Pt verbalized understanding.    PATIENT EDUCATION: Education details: HEP, POC Person educated: Patient Education method: Programmer, multimedia, Demonstration, Verbal cues, and Handouts Education comprehension: verbalized understanding, returned demonstration, and verbal cues required  HOME EXERCISE PROGRAM: Access Code: [email or web address] URL: [email or web address] Date: 07/22/2023 Prepared by: Narda Amber  Exercises - Supine Chin Tuck  - 2 x daily - 7 x weekly - 2 sets - 10 reps - 5 seconds hold - Supine Scapular Retraction  - 2 x daily - 7 x weekly - 2 sets - 10 reps - 5 seconds hold - Seated Scapular Retraction  - 2 x daily - 7 x weekly - 2 sets - 10 reps - 5 seconds hold - Seated Cervical Retraction  - 2 x daily - 7 x weekly - 2 sets - 10 reps - 5 seconds hold - Flexion-Extension Shoulder Pendulum with Table Support  - 3-5 x daily - 7 x weekly - 2 sets - 10 reps - Horizontal Shoulder Pendulum with Table Support  - 3-5 x daily - 7 x weekly - 2 sets - 10 reps - Circular Shoulder Pendulum with Table Support  - 3-5 x daily - 7 x weekly - 2 sets - 10 reps - Standing Circular Shoulder Pendulum Supported with Arm Bent  - 3-5 x daily - 7 x weekly - 2 sets - 10 reps - Standing Horizontal Shoulder Pendulum Supported with Arm Bent  - 3-5 x daily -  7 x weekly - 2 sets - 10 reps - Standing Flexion Extension Shoulder Pendulum Supported with Arm Bent  - 3-5 x daily - 7 x weekly - 2 sets - 10 reps - Seated Shoulder Flexion Slide at Table Top with Forearm in Neutral  - 1-2 x daily - 7 x weekly - 1-2 sets - 10 reps - 5-10 seconds hold - Seated Shoulder Scaption Slide at Table Top with Forearm in Neutral  - 1-2 x daily - 7 x weekly - 1-2 sets - 10 reps - 5-10 seconds hold - Seated Shoulder External Rotation PROM on Table  - 1-2 x daily - 7 x weekly - 1-2 sets - 10 reps - 5-10 seconds hold  ASSESSMENT:  CLINICAL IMPRESSION: Patient appears to be performing HEP safely & correctly.  His PROM has improved significantly.  Continue skilled PT interventions per shoulder protocol.    OBJECTIVE IMPAIRMENTS: decreased activity tolerance, decreased knowledge of condition, decreased ROM, decreased strength, increased edema, impaired flexibility, impaired UE functional use, and pain.   ACTIVITY LIMITATIONS: carrying, lifting, bed mobility, reach over head, and exercise  PARTICIPATION LIMITATIONS: cleaning and  laundry  PERSONAL FACTORS: Past/current experiences and 1-2 comorbidities: see PMH  are also affecting patient's functional outcome.   REHAB POTENTIAL: Good  CLINICAL DECISION MAKING: Stable/uncomplicated  EVALUATION COMPLEXITY: Low   GOALS: Goals reviewed with patient? Yes  SHORT TERM GOALS: (target date for Short term goals are 3 weeks 08/07/2023)  1.Patient will demonstrate independent use of home exercise program to maintain progress from in clinic treatments. Goal status: MET 07/30/2023  LONG TERM GOALS: (target dates for all long term goals are 8 weeks  09/06/2023)   1. Patient will demonstrate/report pain at worst less than or equal to 2/10 to facilitate minimal limitation in daily activity secondary to pain symptoms. Goal status: New   2. Patient will demonstrate independent use of home exercise program to facilitate ability to maintain/progress functional gains from skilled physical therapy services. Goal status: New   3. Patient will demonstrate FOTO outcome > or = 70 % to indicate reduced disability due to condition. Goal status: New   4.  Patient will demonstrate left shoulder UE MMT 5/5 throughout to facilitate lifting, reaching, carrying at [facility] in daily activity.   Goal status: New   5.  Patient will demonstrate left GH joint AROM WFL s symptoms to facilitate usual overhead reaching, self care, dressing at PLOF.    Goal status: Ongoing 07/30/2023   6.  Patient able to lift 2# to top shelf of upper cabinet.  Goal status: Ongoing 07/30/2023   7.  patient reports able to use LUE with swimming and other exercise equipment per his goal. Goal Status: Ongoing 07/30/2023    PLAN: PT FREQUENCY: 1-2x/week  PT DURATION: 8 weeks  PLANNED INTERVENTIONS: 97110-Therapeutic exercises, 97530- Therapeutic activity, 97112- Neuromuscular re-education, 97535- Self Care, 96295- Manual therapy, 97016- Vasopneumatic device, Patient/Family education, Taping, Joint mobilization,  Scar mobilization, Cryotherapy, and Moist heat  PLAN FOR NEXT SESSION: PT reduced frequency to 1x/wk until sees MD 12/26 & probably can actively use shoulder,  check PROM prior to manual therapy,  PROM left shoulder,  vaso for edema as noted.    Vladimir Faster, PT, DPT 07/30/2023, 11:08 AM

## 2023-07-31 ENCOUNTER — Encounter: Payer: Medicare Other | Admitting: Physical Therapy

## 2023-07-31 ENCOUNTER — Ambulatory Visit: Payer: Medicare Other | Attending: Cardiology

## 2023-07-31 DIAGNOSIS — R002 Palpitations: Secondary | ICD-10-CM

## 2023-07-31 DIAGNOSIS — I499 Cardiac arrhythmia, unspecified: Secondary | ICD-10-CM

## 2023-07-31 NOTE — Progress Notes (Unsigned)
Enrolled patient for a 14 day [employer] XT  monitor to be mailed to patients home  °

## 2023-08-04 LAB — GENECONNECT MOLECULAR SCREEN: Genetic Analysis Overall Interpretation: NEGATIVE

## 2023-08-05 ENCOUNTER — Encounter: Payer: Self-pay | Admitting: Physical Therapy

## 2023-08-05 ENCOUNTER — Ambulatory Visit: Payer: Medicare Other | Admitting: Physical Therapy

## 2023-08-05 DIAGNOSIS — M25612 Stiffness of left shoulder, not elsewhere classified: Secondary | ICD-10-CM | POA: Diagnosis not present

## 2023-08-05 DIAGNOSIS — M6281 Muscle weakness (generalized): Secondary | ICD-10-CM

## 2023-08-05 DIAGNOSIS — M25512 Pain in left shoulder: Secondary | ICD-10-CM

## 2023-08-05 DIAGNOSIS — R600 Localized edema: Secondary | ICD-10-CM | POA: Diagnosis not present

## 2023-08-05 NOTE — Therapy (Signed)
OUTPATIENT PHYSICAL THERAPY UPPER EXTREMITY TREATMENT   Patient Name: Douglas Hendricks MRN: [id number] DOB:1956-04-26, 67 y.o., male Today's Date: 08/05/2023  END OF SESSION:  PT End of Session - 08/05/23 1426     Visit Number 4    Number of Visits 15    Date for PT Re-Evaluation 09/06/23    Authorization Type [employer] Medicare    Progress Note Due on Visit 10    PT Start Time 1426    PT Stop Time 1450    PT Time Calculation (min) 24 min    Activity Tolerance Patient tolerated treatment well    Behavior During Therapy [facility] for tasks assessed/performed                Past Medical History:  Diagnosis Date   Arthritis    S/p right hip replacement with redo surgery in 2014   BPH (benign prostatic hyperplasia)    Complication of anesthesia    severe chest pain 2008   Hernia, inguinal, left 2017   Hyperlipidemia    Intolerant to statins-myalgias (atorvastatin 20 mg)   OSA (obstructive sleep apnea)    Thoracic stomach hernia 2015   Past Surgical History:  Procedure Laterality Date   ABDOMINAL HERNIA REPAIR  2015   Umbilical   Cataract surgery Bilateral    COLONOSCOPY  10/2019   Repeated every 3 years   hip pain Bilateral    INGUINAL HERNIA REPAIR Left    KNEE SURGERY Left 1997   SHOULDER ARTHROSCOPY WITH ROTATOR CUFF REPAIR AND SUBACROMIAL DECOMPRESSION Left 07/10/2023   Procedure: LEFT SHOULDER ARTHROSCOPY, EXTENSIVE DEBRIDEMENT, SUBACROMIAL DECOMPRESSION, ROTATOR CUFF REPAIR;  Surgeon: Tarry Kos, MD;  Location: [facility];  Service: Orthopedics;  Laterality: Left;   SHOULDER SURGERY     TOTAL HIP ARTHROPLASTY Right 06/29/2013   [facility]-Dr. Everardo Beals (Redo THR)   TYMPANOSTOMY Right 04/2020   Patient Active Problem List   Diagnosis Date Noted   Traumatic tear of supraspinatus tendon, left, initial encounter 07/10/2023   Degenerative tear of glenoid labrum, left 07/10/2023   Adjustment disorder with mixed emotional features 06/18/2023   Elevated coronary  artery calcium score 05/15/2023   Hyperlipidemia LDL goal <70 05/15/2023   Pain in right hip 06/19/2022   Partial tear of left rotator cuff 02/16/2020   Impingement syndrome of left shoulder 02/16/2020   Pain in left shoulder 08/19/2019   Acromioclavicular sprain, left, initial encounter 07/09/2019   Chronic right shoulder pain 08/25/2018   Ventral hernia without obstruction or gangrene 03/18/2014   PCP: [name], MD  REFERRING PROVIDER: Tarry Kos, MD  REFERRING DIAG:  [phone] (ICD-10-CM) - Degenerative tear of glenoid labrum, left  K74.259D (ICD-10-CM) - Traumatic tear of supraspinatus tendon, left, initial encounter   THERAPY DIAG:  No diagnosis found.  Rationale for Evaluation and Treatment: Rehabilitation  ONSET DATE: 07/10/2023 RTC repair surgery  SUBJECTIVE:  SUBJECTIVE STATEMENT: He has doing his exercises 1-2 x/day and feels that he has leveled out until he can move to next stage.  He is wearing sling when leaves house but not using it around the house or to sleep.    Eval:  He has had 18 months of left shoulder pain for ~3 years.  Patient underwent left rotator cuff tear repair arthroscopically with extensive debridement & subacromial decompression on 07/10/2023.  Hand dominance: Right  PERTINENT HISTORY: left RTC repair 07/10/23, bilateral THA 2015 & 2014, right RTC repair ~2018, abdominal hernia repair,   PAIN:  Are you having pain? Yes: NPRS scale: today  1/10, over last week 0/10 - 3/10 Pain location: left shoulder more anterior & posterior over muscle areas Pain description: ache Aggravating factors: minor movement like putting shirt on Relieving factors: wearing sling & minimize movement  PRECAUTIONS: Shoulder  RED FLAGS: None   WEIGHT BEARING RESTRICTIONS:  No  FALLS:  Has patient fallen in last 6 months? No  LIVING ENVIRONMENT: Lives with: lives alone Lives in: Mobile home loft extra sleeping Stairs: Yes: External: 0 steps; none  OCCUPATION: Retired  PLOF: Independent  PATIENT GOALS: to use arm to swim, exercise and function with ADLs  NEXT MD VISIT: 07/18/2023  OBJECTIVE:  Note: Objective measures were completed at Evaluation unless otherwise noted.  PATIENT SURVEYS :  FOTO 52% with target 70%  COGNITION: Overall cognitive status: Within functional limits for tasks assessed     SENSATION: WFL  POSTURE: Head forward and rounded shoulder  UPPER EXTREMITY ROM:   Passive ROM Left eval Left 07/30/23 Left 08/05/23  Shoulder flexion 69* 149* 151*  Shoulder extension     Shoulder abduction 90* 119* 168*  Shoulder adduction     Shoulder internal rotation 30* 55* 66*  Shoulder external rotation 30* 48* 51*  Elbow flexion     Elbow extension     Wrist flexion     Wrist extension     Wrist ulnar deviation     Wrist radial deviation     Wrist pronation     Wrist supination     (Blank rows = not tested)  UPPER EXTREMITY MMT: Left shoulder MMT Not Tested at eval due to time since sg  MMT    Shoulder flexion    Shoulder extension    Shoulder abduction    Shoulder adduction    Shoulder internal rotation    Shoulder external rotation    Middle trapezius    Lower trapezius    Elbow flexion    Elbow extension    Wrist flexion    Wrist extension    Wrist ulnar deviation    Wrist radial deviation    Wrist pronation    Wrist supination    Grip strength (lbs)    (Blank rows = not tested)  PALPATION:  Tenderness over pectoralis, upper trapezius, middle traps, levator scapulae and biceps    TODAY'S TREATMENT:  DATE: 07/30/2023:  TherEx:  Pendulums: flexion/extension, abd/add, circles both directions x 2  minutes See objective measurements   Therapeutic Activities: PT demo & verbal cues on positioning LUE in sidelying.  Pt verbalized understanding.    TREATMENT:                                                                                                       DATE: 07/30/2023:  TherEx:  Pt return demo HEP with PT minor corrections.  PT recommended following shoulder with head/eyes to facilitate natural upper body motions.  Pendulums: flexion/extension, abd/add, circles both directions x 2 minutes Standing passive flexion in cradle postion using Rt UE x 10  Standing passive abduction / adduction in cradle postion using Rt UE x 10  Standing passive circles CW & CCW in cradle postion using Rt UE x 10  Flexion table slides: x 10 holding 10 sec, alternating reps with hand position from pronation to neutral forearm rotation Scaption table slides x 10 holding 10 sec, alternating reps with hand position from pronation to neutral forearm rotation abduction table slides x 10 holding 10 sec, alternating reps with hand position from pronation to neutral forearm rotation ER table slides x 10 holding 5 sec Seated Scapular retraction x 10 holding 5 sec Seated cervical retraction X 10 holding 5 sec Manual:  PROM to pt's tolerance to left shoulder   TREATMENT:                                                                                                       DATE: 07/22/23:  TherEx:  Pendulums: flexion/extension, abd/add, circles both directions x 2 minutes Seated passive flexion in cradle postion using Rt UE x 10  Flexion table slides: x 10 holding 10 sec Scaption table slides x 10 holding 10 sec ER table slides x 10 holding 5 sec Scapular retraction x 10 holding 5 sec Manual:  PROM to pt's tolerance to left shoulder   PATIENT EDUCATION: Education details: HEP, POC Person educated: Patient Education method: Programmer, multimedia, Demonstration, Verbal cues, and Handouts Education comprehension:  verbalized understanding, returned demonstration, and verbal cues required  HOME EXERCISE PROGRAM: Access Code: [email or web address] URL: [email or web address] Date: 07/22/2023 Prepared by: Narda Amber  Exercises - Supine Chin Tuck  - 2 x daily - 7 x weekly - 2 sets - 10 reps - 5 seconds hold - Supine Scapular Retraction  - 2 x daily - 7 x weekly - 2 sets - 10 reps - 5 seconds hold - Seated Scapular Retraction  - 2 x daily - 7 x weekly - 2 sets - 10 reps - 5 seconds hold - Seated Cervical Retraction  -  2 x daily - 7 x weekly - 2 sets - 10 reps - 5 seconds hold - Flexion-Extension Shoulder Pendulum with Table Support  - 3-5 x daily - 7 x weekly - 2 sets - 10 reps - Horizontal Shoulder Pendulum with Table Support  - 3-5 x daily - 7 x weekly - 2 sets - 10 reps - Circular Shoulder Pendulum with Table Support  - 3-5 x daily - 7 x weekly - 2 sets - 10 reps - Standing Circular Shoulder Pendulum Supported with Arm Bent  - 3-5 x daily - 7 x weekly - 2 sets - 10 reps - Standing Horizontal Shoulder Pendulum Supported with Arm Bent  - 3-5 x daily - 7 x weekly - 2 sets - 10 reps - Standing Flexion Extension Shoulder Pendulum Supported with Arm Bent  - 3-5 x daily - 7 x weekly - 2 sets - 10 reps - Seated Shoulder Flexion Slide at Table Top with Forearm in Neutral  - 1-2 x daily - 7 x weekly - 1-2 sets - 10 reps - 5-10 seconds hold - Seated Shoulder Scaption Slide at Table Top with Forearm in Neutral  - 1-2 x daily - 7 x weekly - 1-2 sets - 10 reps - 5-10 seconds hold - Seated Shoulder External Rotation PROM on Table  - 1-2 x daily - 7 x weekly - 1-2 sets - 10 reps - 5-10 seconds hold  ASSESSMENT:  CLINICAL IMPRESSION: Patient appears to have excellent PROM and is compliant with current HEP.  PT recommended using HEP to maintain PROM until MD allows progression of shoulder activities.    OBJECTIVE IMPAIRMENTS: decreased activity tolerance, decreased knowledge of condition, decreased ROM, decreased  strength, increased edema, impaired flexibility, impaired UE functional use, and pain.   ACTIVITY LIMITATIONS: carrying, lifting, bed mobility, reach over head, and exercise  PARTICIPATION LIMITATIONS: cleaning and laundry  PERSONAL FACTORS: Past/current experiences and 1-2 comorbidities: see PMH  are also affecting patient's functional outcome.   REHAB POTENTIAL: Good  CLINICAL DECISION MAKING: Stable/uncomplicated  EVALUATION COMPLEXITY: Low   GOALS: Goals reviewed with patient? Yes  SHORT TERM GOALS: (target date for Short term goals are 3 weeks 08/07/2023)  1.Patient will demonstrate independent use of home exercise program to maintain progress from in clinic treatments. Goal status: MET 07/30/2023  LONG TERM GOALS: (target dates for all long term goals are 8 weeks  09/06/2023)   1. Patient will demonstrate/report pain at worst less than or equal to 2/10 to facilitate minimal limitation in daily activity secondary to pain symptoms. Goal status: Ongoing   08/05/2023   2. Patient will demonstrate independent use of home exercise program to facilitate ability to maintain/progress functional gains from skilled physical therapy services. Goal status: Ongoing   08/05/2023   3. Patient will demonstrate FOTO outcome > or = 70 % to indicate reduced disability due to condition. Goal status: Ongoing   08/05/2023   4.  Patient will demonstrate left shoulder UE MMT 5/5 throughout to facilitate lifting, reaching, carrying at [facility] in daily activity.   Goal status: Ongoing  08/05/2023   5.  Patient will demonstrate left GH joint AROM WFL s symptoms to facilitate usual overhead reaching, self care, dressing at PLOF.    Goal status: Ongoing 08/05/2023   6.  Patient able to lift 2# to top shelf of upper cabinet.  Goal status: Ongoing 08/05/2023   7.  patient reports able to use LUE with swimming and other exercise equipment per his  goal. Goal Status: Ongoing 08/05/2023    PLAN: PT  FREQUENCY: 1-2x/week  PT DURATION: 8 weeks  PLANNED INTERVENTIONS: 97110-Therapeutic exercises, 97530- Therapeutic activity, 97112- Neuromuscular re-education, 97535- Self Care, 82956- Manual therapy, 97016- Vasopneumatic device, Patient/Family education, Taping, Joint mobilization, Scar mobilization, Cryotherapy, and Moist heat  PLAN FOR NEXT SESSION:   see MD 12/26 & progress HEP / shoulder activities per MD recommendations / protocol   Vladimir Faster, PT, DPT 08/05/2023, 2:55 PM

## 2023-08-06 DIAGNOSIS — I499 Cardiac arrhythmia, unspecified: Secondary | ICD-10-CM

## 2023-08-06 DIAGNOSIS — R002 Palpitations: Secondary | ICD-10-CM

## 2023-08-07 ENCOUNTER — Encounter: Payer: Medicare Other | Admitting: Physical Therapy

## 2023-08-07 DIAGNOSIS — L728 Other follicular cysts of the skin and subcutaneous tissue: Secondary | ICD-10-CM | POA: Diagnosis not present

## 2023-08-07 DIAGNOSIS — L821 Other seborrheic keratosis: Secondary | ICD-10-CM | POA: Diagnosis not present

## 2023-08-07 DIAGNOSIS — D2371 Other benign neoplasm of skin of right lower limb, including hip: Secondary | ICD-10-CM | POA: Diagnosis not present

## 2023-08-07 DIAGNOSIS — L814 Other melanin hyperpigmentation: Secondary | ICD-10-CM | POA: Diagnosis not present

## 2023-08-09 ENCOUNTER — Encounter: Payer: Medicare Other | Admitting: Physical Therapy

## 2023-08-13 ENCOUNTER — Ambulatory Visit: Payer: Medicare Other | Admitting: Licensed Clinical Social Worker

## 2023-08-13 ENCOUNTER — Ambulatory Visit (INDEPENDENT_AMBULATORY_CARE_PROVIDER_SITE_OTHER): Payer: Medicare Other | Admitting: Physician Assistant

## 2023-08-13 ENCOUNTER — Encounter: Payer: Medicare Other | Admitting: Physical Therapy

## 2023-08-13 DIAGNOSIS — Z9889 Other specified postprocedural states: Secondary | ICD-10-CM

## 2023-08-13 NOTE — Progress Notes (Signed)
Post-Op Visit Note   Patient: Douglas Hendricks           Date of Birth: [birth year]           MRN: [id number] Visit Date: 08/13/2023 PCP: [name], MD   Assessment & Plan:  Chief Complaint:  Chief Complaint  Patient presents with   Left Shoulder - Routine Post Op   Visit Diagnoses:  1. S/P left rotator cuff repair     Plan: Patient is a pleasant 67 year old gentleman who comes in today 5 weeks status post left shoulder arthroscopic debridement rotator cuff repair 07/10/2023.  He has been doing great.  No pain.  He has been compliant wearing his sling.  He has been in physical therapy making fantastic progress.  Examination of his left shoulder reveals forward flexion to approximately 150 degrees.  Internal rotation to 66 degrees, external rotation to 51 degrees.  He is neurovascularly intact distally.  At this point, he is doing very well but is only 5 weeks out from rotator cuff repair.  I would like to have him continue wearing his sling for another week.  Continue with the current physical therapy protocol for another week and then progress to the next phase.  He will follow-up with [place] in 7 weeks when he is 12 weeks out from surgery.  Call with concerns or questions.  Follow-Up Instructions: Return in about 7 weeks (around 10/01/2023).   Orders:  No orders of the defined types were placed in this encounter.  No orders of the defined types were placed in this encounter.   Imaging: No new imaging  PMFS History: Patient Active Problem List   Diagnosis Date Noted   Traumatic tear of supraspinatus tendon, left, initial encounter 07/10/2023   Degenerative tear of glenoid labrum, left 07/10/2023   Adjustment disorder with mixed emotional features 06/18/2023   Elevated coronary artery calcium score 05/15/2023   Hyperlipidemia LDL goal <70 05/15/2023   Pain in right hip 06/19/2022   Partial tear of left rotator cuff 02/16/2020   Impingement syndrome of left shoulder  02/16/2020   Pain in left shoulder 08/19/2019   Acromioclavicular sprain, left, initial encounter 07/09/2019   Chronic right shoulder pain 08/25/2018   Ventral hernia without obstruction or gangrene 03/18/2014   Past Medical History:  Diagnosis Date   Arthritis    S/p right hip replacement with redo surgery in 2014   BPH (benign prostatic hyperplasia)    Complication of anesthesia    severe chest pain 2008   Hernia, inguinal, left 2017   Hyperlipidemia    Intolerant to statins-myalgias (atorvastatin 20 mg)   OSA (obstructive sleep apnea)    Thoracic stomach hernia 2015    Family History  Problem Relation Age of Onset   Cancer Mother        pancreatic   COPD Father    Cancer Father        esophageal   Ovarian cancer Sister        In remission x 15 years   Throat cancer Sister    Healthy Sister    Valvular heart disease Brother        Heart valve surgery   Hyperlipidemia Brother    Other Brother        Low back pain from spinal:/Spinal stenosis   Glaucoma Brother     Past Surgical History:  Procedure Laterality Date   ABDOMINAL HERNIA REPAIR  2015   Umbilical   Cataract surgery Bilateral  COLONOSCOPY  10/2019   Repeated every 3 years   hip pain Bilateral    INGUINAL HERNIA REPAIR Left    KNEE SURGERY Left 1997   SHOULDER ARTHROSCOPY WITH ROTATOR CUFF REPAIR AND SUBACROMIAL DECOMPRESSION Left 07/10/2023   Procedure: LEFT SHOULDER ARTHROSCOPY, EXTENSIVE DEBRIDEMENT, SUBACROMIAL DECOMPRESSION, ROTATOR CUFF REPAIR;  Surgeon: Tarry Kos, MD;  Location: [facility];  Service: Orthopedics;  Laterality: Left;   SHOULDER SURGERY     TOTAL HIP ARTHROPLASTY Right 06/29/2013   [facility]-Dr. Everardo Beals (Redo THR)   TYMPANOSTOMY Right 04/2020   Social History   Occupational History   Not on file  Tobacco Use   Smoking status: Never   Smokeless tobacco: Never   Tobacco comments:    Drinks 2 12 ounce servings of coffee a day.  Vaping Use   Vaping status:  Never Used  Substance and Sexual Activity   Alcohol use: Yes    Alcohol/week: 4.0 standard drinks of alcohol    Types: 4 Standard drinks or equivalent per week    Comment: 1-2   Drug use: No   Sexual activity: Not on file

## 2023-08-14 ENCOUNTER — Encounter: Payer: Self-pay | Admitting: Orthopaedic Surgery

## 2023-08-14 ENCOUNTER — Encounter: Payer: Medicare Other | Admitting: Physical Therapy

## 2023-08-16 ENCOUNTER — Encounter: Payer: Medicare Other | Admitting: Physical Therapy

## 2023-08-16 DIAGNOSIS — H838X3 Other specified diseases of inner ear, bilateral: Secondary | ICD-10-CM | POA: Diagnosis not present

## 2023-08-16 DIAGNOSIS — H903 Sensorineural hearing loss, bilateral: Secondary | ICD-10-CM | POA: Diagnosis not present

## 2023-08-16 DIAGNOSIS — H90A31 Mixed conductive and sensorineural hearing loss, unilateral, right ear with restricted hearing on the contralateral side: Secondary | ICD-10-CM | POA: Diagnosis not present

## 2023-08-16 DIAGNOSIS — H90A22 Sensorineural hearing loss, unilateral, left ear, with restricted hearing on the contralateral side: Secondary | ICD-10-CM | POA: Diagnosis not present

## 2023-08-19 ENCOUNTER — Encounter: Payer: Medicare Other | Admitting: Physical Therapy

## 2023-08-22 ENCOUNTER — Encounter: Payer: Medicare Other | Admitting: Orthopaedic Surgery

## 2023-08-22 ENCOUNTER — Encounter: Payer: Medicare Other | Admitting: Rehabilitative and Restorative Service Providers"

## 2023-08-26 DIAGNOSIS — R002 Palpitations: Secondary | ICD-10-CM | POA: Diagnosis not present

## 2023-08-26 DIAGNOSIS — I499 Cardiac arrhythmia, unspecified: Secondary | ICD-10-CM | POA: Diagnosis not present

## 2023-08-29 ENCOUNTER — Ambulatory Visit: Payer: Medicare Other | Admitting: Rehabilitative and Restorative Service Providers"

## 2023-08-29 ENCOUNTER — Encounter: Payer: Self-pay | Admitting: Rehabilitative and Restorative Service Providers"

## 2023-08-29 DIAGNOSIS — R600 Localized edema: Secondary | ICD-10-CM | POA: Diagnosis not present

## 2023-08-29 DIAGNOSIS — M6281 Muscle weakness (generalized): Secondary | ICD-10-CM

## 2023-08-29 DIAGNOSIS — M25512 Pain in left shoulder: Secondary | ICD-10-CM

## 2023-08-29 DIAGNOSIS — M25612 Stiffness of left shoulder, not elsewhere classified: Secondary | ICD-10-CM

## 2023-08-29 NOTE — Therapy (Signed)
 OUTPATIENT PHYSICAL THERAPY TREATMENT   Patient Name: Douglas Hendricks MRN: [id number] DOB:1956-08-20, 68 y.o., male Today's Date: 08/29/2023  END OF SESSION:  PT End of Session - 08/29/23 0947     Visit Number 5    Number of Visits 15    Date for PT Re-Evaluation 09/06/23    Authorization Type [employer] Medicare    Authorization Time Period 07/17/2023-09/25/2023    Authorization - Number of Visits 15    Progress Note Due on Visit 10    PT Start Time 1000    PT Stop Time 1039    PT Time Calculation (min) 39 min    Activity Tolerance Patient tolerated treatment well    Behavior During Therapy [facility] for tasks assessed/performed                 Past Medical History:  Diagnosis Date   Arthritis    S/p right hip replacement with redo surgery in 2014   BPH (benign prostatic hyperplasia)    Complication of anesthesia    severe chest pain 2008   Hernia, inguinal, left 2017   Hyperlipidemia    Intolerant to statins-myalgias (atorvastatin 20 mg)   OSA (obstructive sleep apnea)    Thoracic stomach hernia 2015   Past Surgical History:  Procedure Laterality Date   ABDOMINAL HERNIA REPAIR  2015   Umbilical   Cataract surgery Bilateral    COLONOSCOPY  10/2019   Repeated every 3 years   hip pain Bilateral    INGUINAL HERNIA REPAIR Left    KNEE SURGERY Left 1997   SHOULDER ARTHROSCOPY WITH ROTATOR CUFF REPAIR AND SUBACROMIAL DECOMPRESSION Left 07/10/2023   Procedure: LEFT SHOULDER ARTHROSCOPY, EXTENSIVE DEBRIDEMENT, SUBACROMIAL DECOMPRESSION, ROTATOR CUFF REPAIR;  Surgeon: Jerri Kay HERO, MD;  Location: [facility];  Service: Orthopedics;  Laterality: Left;   SHOULDER SURGERY     TOTAL HIP ARTHROPLASTY Right 06/29/2013   [facility]-Dr. Sande (Redo THR)   TYMPANOSTOMY Right 04/2020   Patient Active Problem List   Diagnosis Date Noted   Traumatic tear of supraspinatus tendon, left, initial encounter 07/10/2023   Degenerative tear of glenoid labrum, left 07/10/2023    Adjustment disorder with mixed emotional features 06/18/2023   Elevated coronary artery calcium  score 05/15/2023   Hyperlipidemia LDL goal <70 05/15/2023   Pain in right hip 06/19/2022   Partial tear of left rotator cuff 02/16/2020   Impingement syndrome of left shoulder 02/16/2020   Pain in left shoulder 08/19/2019   Acromioclavicular sprain, left, initial encounter 07/09/2019   Chronic right shoulder pain 08/25/2018   Ventral hernia without obstruction or gangrene 03/18/2014   PCP: Okey Carlin Redbird, MD  REFERRING PROVIDER: Jerri Kay HERO, MD  REFERRING DIAG:  [phone] (ICD-10-CM) - Degenerative tear of glenoid labrum, left  D53.187J (ICD-10-CM) - Traumatic tear of supraspinatus tendon, left, initial encounter   THERAPY DIAG:  No diagnosis found.  Rationale for Evaluation and Treatment: Rehabilitation  ONSET DATE: 07/10/2023 RTC repair surgery  SUBJECTIVE:  SUBJECTIVE STATEMENT: He has doing his exercises 1-2 x/day and feels that he has leveled out until he can move to next stage.  He is wearing sling when leaves house but not using it around the house or to sleep.     Hand dominance: Right  PERTINENT HISTORY: left RTC repair 07/10/23, bilateral THA 2015 & 2014, right RTC repair ~2018, abdominal hernia repair,   PAIN:   NPRS scale: today  1/10, over last week 0/10 - 3/10 Pain location: left shoulder more anterior & posterior over muscle areas Pain description: ache Aggravating factors: minor movement like putting shirt on Relieving factors: wearing sling & minimize movement  PRECAUTIONS: Shoulder  RED FLAGS: None   WEIGHT BEARING RESTRICTIONS: No  FALLS:  Has patient fallen in last 6 months? No  LIVING ENVIRONMENT: Lives with: lives alone Lives in: Mobile home loft extra  sleeping Stairs: Yes: External: 0 steps; none  OCCUPATION: Retired  PLOF: Independent  PATIENT GOALS: to use arm to swim, exercise and function with ADLs  NEXT MD VISIT: 07/18/2023  OBJECTIVE:  Note: Objective measures were completed at Evaluation unless otherwise noted.  PATIENT SURVEYS :  07/17/2023 FOTO 52% with target 70%  COGNITION: 07/17/2023 Overall cognitive status: Within functional limits for tasks assessed     SENSATION: 07/17/2023 [facility]  POSTURE: 07/17/2023 Head forward and rounded shoulder  UPPER EXTREMITY ROM:    ROM Left 07/17/2023 Passive Left 07/30/23 Passive Left 08/05/23 Passive Left 08/29/2023 AROM In supine  Shoulder flexion 69* 149* 151* 175  Shoulder extension      Shoulder abduction 90* 119* 168* 170  Shoulder adduction      Shoulder internal rotation 30* 55* 66* 80 in 45 deg abduction  Shoulder external rotation 30* 48* 51* 85 in 45 deg abduction  Elbow flexion      Elbow extension      Wrist flexion      Wrist extension      Wrist ulnar deviation      Wrist radial deviation      Wrist pronation      Wrist supination      (Blank rows = not tested)  UPPER EXTREMITY MMT: 07/17/2023 Left shoulder MMT Not Tested at eval due to time since sg  MMT 08/29/2023 Right 08/29/2023 Left  Shoulder flexion 5/5 4/5  Shoulder extension    Shoulder abduction 5/5 3+/5  Shoulder adduction    Shoulder internal rotation 5/5 5/5  Shoulder external rotation 5/5 4/5  Middle trapezius    Lower trapezius    Elbow flexion 5/5   Elbow extension 5/5   Wrist flexion    Wrist extension    Wrist ulnar deviation    Wrist radial deviation    Wrist pronation    Wrist supination    Grip strength (lbs)    (Blank rows = not tested)  PALPATION:  07/17/2023 Tenderness over pectoralis, upper trapezius, middle traps, levator scapulae and biceps                    TODAY'S TREATMENT:  DATE: 08/29/2023:  Therex: UBE fwd/back 3 mins each way lvl 3.0  Tband rows green 2 x 15 bilateral Tband gh ext green 2 x 15 bilateral Lt shoulder ER walkout isometric green band 5 sec hold x8, with blue band x 2 with towel under arm Lt shoulder IR blue band 2 x 15 c towel under arm Standing 90 deg flexion 2 lb ball small circles in 90 deg flexion 2 x 30 cw, ccw each Wall push up with SA press hold 3 seconds bilateral arm x 15    TODAY'S TREATMENT:                                                                                                       DATE: 07/30/2023:  TherEx:  Pendulums: flexion/extension, abd/add, circles both directions x 2 minutes See objective measurements   Therapeutic Activities: PT demo & verbal cues on positioning LUE in sidelying.  Pt verbalized understanding.    TREATMENT:                                                                                                       DATE: 07/30/2023:  TherEx:  Pt return demo HEP with PT minor corrections.  PT recommended following shoulder with head/eyes to facilitate natural upper body motions.  Pendulums: flexion/extension, abd/add, circles both directions x 2 minutes Standing passive flexion in cradle postion using Rt +UE x 10  Standing passive abduction / adduction in cradle postion using Rt UE x 10  Standing passive circles CW & CCW in cradle postion using Rt UE x 10  Flexion table slides: x 10 holding 10 sec, alternating reps with hand position from pronation to neutral forearm rotation Scaption table slides x 10 holding 10 sec, alternating reps with hand position from pronation to neutral forearm rotation abduction table slides x 10 holding 10 sec, alternating reps with hand position from pronation to neutral forearm rotation ER table slides x 10 holding 5 sec Seated Scapular retraction x 10 holding 5 sec Seated cervical retraction X 10 holding 5 sec Manual:  PROM to pt's tolerance to  left shoulder    PATIENT EDUCATION: 08/29/2023 Update:  Education details: HEP Person educated: Patient Education method: Programmer, Multimedia, Facilities Manager, Verbal cues, and Handouts Education comprehension: verbalized understanding, returned demonstration, and verbal cues required  HOME EXERCISE PROGRAM: Access Code: [email or web address] URL: [email or web address] Date: 08/29/2023 Prepared by: Ozell Silvan  Exercises - Seated Scapular Retraction  - 2 x daily - 7 x weekly - 2 sets - 10 reps - 5 seconds hold - Standing Bilateral Low Shoulder Row with Anchored Resistance  - 1-2 x daily - 7 x weekly - 2-3  sets - 10-15 reps - Shoulder Extension with Resistance  - 1-2 x daily - 7 x weekly - 2-3 sets - 10-15 reps - Shoulder External Rotation Reactive Isometrics  - 1 x daily - 7 x weekly - 1 sets - 10 reps - 5-15 hold - Shoulder External Rotation with Anchored Resistance  - 1 x daily - 7 x weekly - 3 sets - 10-15 reps - Shoulder Internal Rotation with Resistance (Mirrored)  - 1-2 x daily - 7 x weekly - 3 sets - 10 reps  ASSESSMENT:  CLINICAL IMPRESSION: Good AROM measurements noted today.  Progressed into strengthening to help promote improved functional reach/lifting and strengthening. Continued skilled PT services indicated at this time.   OBJECTIVE IMPAIRMENTS: decreased activity tolerance, decreased knowledge of condition, decreased ROM, decreased strength, increased edema, impaired flexibility, impaired UE functional use, and pain.   ACTIVITY LIMITATIONS: carrying, lifting, bed mobility, reach over head, and exercise  PARTICIPATION LIMITATIONS: cleaning and laundry  PERSONAL FACTORS: Past/current experiences and 1-2 comorbidities: see PMH  are also affecting patient's functional outcome.   REHAB POTENTIAL: Good  CLINICAL DECISION MAKING: Stable/uncomplicated  EVALUATION COMPLEXITY: Low   GOALS: Goals reviewed with patient? Yes  SHORT TERM GOALS: (target date for Short term  goals are 3 weeks 08/07/2023)  1.Patient will demonstrate independent use of home exercise program to maintain progress from in clinic treatments. Goal status: MET 07/30/2023  LONG TERM GOALS: (target dates for all long term goals are 8 weeks  09/06/2023)   1. Patient will demonstrate/report pain at worst less than or equal to 2/10 to facilitate minimal limitation in daily activity secondary to pain symptoms. Goal status: Ongoing   08/05/2023   2. Patient will demonstrate independent use of home exercise program to facilitate ability to maintain/progress functional gains from skilled physical therapy services. Goal status: Ongoing   08/05/2023   3. Patient will demonstrate FOTO outcome > or = 70 % to indicate reduced disability due to condition. Goal status: Ongoing   08/05/2023   4.  Patient will demonstrate left shoulder UE MMT 5/5 throughout to facilitate lifting, reaching, carrying at [facility] in daily activity.   Goal status: Ongoing  08/05/2023   5.  Patient will demonstrate left GH joint AROM WFL s symptoms to facilitate usual overhead reaching, self care, dressing at PLOF.    Goal status: Met 08/29/2023   6.  Patient able to lift 2# to top shelf of upper cabinet.  Goal status: Ongoing 08/05/2023   7.  patient reports able to use LUE with swimming and other exercise equipment per his goal. Goal Status: Ongoing 08/05/2023    PLAN: PT FREQUENCY: 1-2x/week  PT DURATION: 8 weeks  PLANNED INTERVENTIONS: 97110-Therapeutic exercises, 97530- Therapeutic activity, 97112- Neuromuscular re-education, 97535- Self Care, [id number]- Manual therapy, 97016- Vasopneumatic device, Patient/Family education, Taping, Joint mobilization, Scar mobilization, Cryotherapy, and Moist heat  PLAN FOR NEXT SESSION:  Strengthening review and progression as able. May be able to reduce frequency due to rate of improvement.    Ozell KATHEE Silvan, PT, DPT 08/29/2023, 10:45 AM

## 2023-09-03 ENCOUNTER — Ambulatory Visit: Payer: Medicare Other | Admitting: Licensed Clinical Social Worker

## 2023-09-03 ENCOUNTER — Encounter: Payer: Self-pay | Admitting: Rehabilitative and Restorative Service Providers"

## 2023-09-03 ENCOUNTER — Ambulatory Visit: Payer: Medicare Other | Admitting: Rehabilitative and Restorative Service Providers"

## 2023-09-03 DIAGNOSIS — R600 Localized edema: Secondary | ICD-10-CM

## 2023-09-03 DIAGNOSIS — F4329 Adjustment disorder with other symptoms: Secondary | ICD-10-CM

## 2023-09-03 DIAGNOSIS — M25612 Stiffness of left shoulder, not elsewhere classified: Secondary | ICD-10-CM

## 2023-09-03 DIAGNOSIS — M6281 Muscle weakness (generalized): Secondary | ICD-10-CM | POA: Diagnosis not present

## 2023-09-03 DIAGNOSIS — M25512 Pain in left shoulder: Secondary | ICD-10-CM

## 2023-09-03 DIAGNOSIS — F4325 Adjustment disorder with mixed disturbance of emotions and conduct: Secondary | ICD-10-CM | POA: Diagnosis not present

## 2023-09-03 NOTE — Progress Notes (Signed)
  Behavioral Health Counselor/Therapist Progress Note  Patient ID: Douglas Hendricks, MRN: [id number]    Date: 09/03/23  Time Spent: 0900  am - 1000 am : 60 Minutes  Treatment Type: Individual Therapy.  Reported Symptoms: Symptoms of depression and anxiety related to anxiety.  Mental Status Exam: Appearance:  Casual and Disheveled     Behavior: Appropriate  Motor: Normal  Speech/Language:  Clear and Coherent  Affect: Flat  Mood: depressed  Thought process: normal  Thought content:   WNL  Sensory/Perceptual disturbances:   WNL  Orientation: oriented to person, place, time/date, situation, day of week, month of year, and year  Attention: Good  Concentration: Good  Memory: WNL  Fund of knowledge:  Good  Insight:   Good  Judgment:  Good  Impulse Control: Good   Risk Assessment: Danger to Self:  No Self-injurious Behavior: No Danger to Others: No Duty to Warn:no Physical Aggression / Violence:No  Access to Firearms a concern: No  Gang Involvement:No   Subjective:   Douglas Hendricks participated from office, located at [facility] with Clinician present. Maddock consented to treatment.   Douglas Hendricks presented for his session with a flat affect. [name] reported that he spent Christmas with his children and enjoyed the Holidays. He reports that he and [name] are continuing to remain friends. Patient completed the 5 Love languages that  was previously recommended in a session. Patient also completed the assessment and score his time score in physical touch. Douglas Hendricks also shared specifics that he and [name] had spoke about detailing their personal relationship. [name] was very upset at the lack of patients concern of her being able to meet and get to know his children. Patient states that he knew how his son felt as well as his daughter and he was attempting to avoid conflict. Douglas Hendricks now admits he can see that while diffusing conflict with his children he in turn caused conflict in his  relationship. Douglas Hendricks states that he did have a conversation with his son about his setting limits with him concerning his relationship and that he as the father is responsible for his own sins and does not feel that he should have to give account to his son for his relationship status. Douglas Hendricks reports that the conversation went much better that he expected. Douglas Hendricks states he has been on a few dates with other women and still misses [name] but is aware that many changes on his part need to be made in order for that relationship to reconnect. Patient reports both an increase in depression and anxiety as he thinks about his situation and the possibility that he may spend his life alone.  Clinician and patient processed what patient is looking for in his future relationships.  Douglas Hendricks reports that he recognizes that he needs to be more attentive to how his actions affect his partner as well as improve communication on both talking about emotions and actively listening.   Patient is encouraged to work on the following goals in order to improve his future relationships.   Communication: Learning active listening skills  Expressing needs clearly and respectfully  Avoiding negative communication patterns like criticism and defensiveness  Emotional Intimacy: Sharing feelings openly and honestly  Building trust and vulnerability  Understanding and validating each other's emotions  Conflict Resolution: Identifying triggers and patterns of conflict  Learning to compromise and find solutions together  Managing anger and frustration effectively   Patient will continue CBT therapy biweekly. Treatment plan will be reviewed by 06/17/2024.  Interventions: Cognitive Behavioral Therapy  Diagnosis: Adjustment Disorder with mixed emotions.   Douglas Hendricks MSW, LCSW/DATE 09/03/2023

## 2023-09-03 NOTE — Therapy (Signed)
 OUTPATIENT PHYSICAL THERAPY TREATMENT   Patient Name: Douglas Hendricks MRN: [id number] DOB:07/08/1956, 68 y.o., male Today's Date: 09/03/2023  END OF SESSION:  PT End of Session - 09/03/23 1027     Visit Number 6    Number of Visits 15    Date for PT Re-Evaluation 09/06/23    Authorization Type [employer] Medicare    Authorization Time Period 07/17/2023-09/25/2023    Authorization - Visit Number 6    Authorization - Number of Visits 15    Progress Note Due on Visit 10    PT Start Time 1016    PT Stop Time 1056    PT Time Calculation (min) 40 min    Activity Tolerance Patient tolerated treatment well    Behavior During Therapy [facility] for tasks assessed/performed                  Past Medical History:  Diagnosis Date   Arthritis    S/p right hip replacement with redo surgery in 2014   BPH (benign prostatic hyperplasia)    Complication of anesthesia    severe chest pain 2008   Hernia, inguinal, left 2017   Hyperlipidemia    Intolerant to statins-myalgias (atorvastatin 20 mg)   OSA (obstructive sleep apnea)    Thoracic stomach hernia 2015   Past Surgical History:  Procedure Laterality Date   ABDOMINAL HERNIA REPAIR  2015   Umbilical   Cataract surgery Bilateral    COLONOSCOPY  10/2019   Repeated every 3 years   hip pain Bilateral    INGUINAL HERNIA REPAIR Left    KNEE SURGERY Left 1997   SHOULDER ARTHROSCOPY WITH ROTATOR CUFF REPAIR AND SUBACROMIAL DECOMPRESSION Left 07/10/2023   Procedure: LEFT SHOULDER ARTHROSCOPY, EXTENSIVE DEBRIDEMENT, SUBACROMIAL DECOMPRESSION, ROTATOR CUFF REPAIR;  Surgeon: Jerri Kay HERO, MD;  Location: [facility];  Service: Orthopedics;  Laterality: Left;   SHOULDER SURGERY     TOTAL HIP ARTHROPLASTY Right 06/29/2013   [facility]-Dr. Sande (Redo THR)   TYMPANOSTOMY Right 04/2020   Patient Active Problem List   Diagnosis Date Noted   Traumatic tear of supraspinatus tendon, left, initial encounter 07/10/2023   Degenerative tear of  glenoid labrum, left 07/10/2023   Adjustment disorder with mixed emotional features 06/18/2023   Elevated coronary artery calcium  score 05/15/2023   Hyperlipidemia LDL goal <70 05/15/2023   Pain in right hip 06/19/2022   Partial tear of left rotator cuff 02/16/2020   Impingement syndrome of left shoulder 02/16/2020   Pain in left shoulder 08/19/2019   Acromioclavicular sprain, left, initial encounter 07/09/2019   Chronic right shoulder pain 08/25/2018   Ventral hernia without obstruction or gangrene 03/18/2014   PCP: Okey Carlin Redbird, MD  REFERRING PROVIDER: Jerri Kay HERO, MD  REFERRING DIAG:  [phone] (ICD-10-CM) - Degenerative tear of glenoid labrum, left  D53.187J (ICD-10-CM) - Traumatic tear of supraspinatus tendon, left, initial encounter   THERAPY DIAG:  No diagnosis found.  Rationale for Evaluation and Treatment: Rehabilitation  ONSET DATE: 07/10/2023 RTC repair surgery  SUBJECTIVE:  SUBJECTIVE STATEMENT: He has doing his exercises 1-2 x/day and feels that he has leveled out until he can move to next stage.  He is wearing sling when leaves house but not using it around the house or to sleep.     Hand dominance: Right  PERTINENT HISTORY: left RTC repair 07/10/23, bilateral THA 2015 & 2014, right RTC repair ~2018, abdominal hernia repair,   PAIN:   NPRS scale: 1-2/10 upon arrival, up to 3/10 at worst.  Pain location: left shoulder more anterior & posterior over muscle areas Pain description: ache Aggravating factors: minor movement like putting shirt on Relieving factors: wearing sling & minimize movement  PRECAUTIONS: Shoulder  RED FLAGS: None   WEIGHT BEARING RESTRICTIONS: No  FALLS:  Has patient fallen in last 6 months? No  LIVING ENVIRONMENT: Lives with: lives alone Lives  in: Mobile home loft extra sleeping Stairs: Yes: External: 0 steps; none  OCCUPATION: Retired  PLOF: Independent  PATIENT GOALS: to use arm to swim, exercise and function with ADLs  NEXT MD VISIT: 07/18/2023  OBJECTIVE:  Note: Objective measures were completed at Evaluation unless otherwise noted.  PATIENT SURVEYS :  07/17/2023 FOTO 52% with target 70%  COGNITION: 07/17/2023 Overall cognitive status: Within functional limits for tasks assessed     SENSATION: 07/17/2023 [facility]  POSTURE: 07/17/2023 Head forward and rounded shoulder  UPPER EXTREMITY ROM:    ROM Left 07/17/2023 Passive Left 07/30/23 Passive Left 08/05/23 Passive Left 08/29/2023 AROM In supine  Shoulder flexion 69* 149* 151* 175  Shoulder extension      Shoulder abduction 90* 119* 168* 170  Shoulder adduction      Shoulder internal rotation 30* 55* 66* 80 in 45 deg abduction  Shoulder external rotation 30* 48* 51* 85 in 45 deg abduction  Elbow flexion      Elbow extension      Wrist flexion      Wrist extension      Wrist ulnar deviation      Wrist radial deviation      Wrist pronation      Wrist supination      (Blank rows = not tested)  UPPER EXTREMITY MMT: 07/17/2023 Left shoulder MMT Not Tested at eval due to time since sg  MMT 08/29/2023 Right 08/29/2023 Left  Shoulder flexion 5/5 4/5  Shoulder extension    Shoulder abduction 5/5 3+/5  Shoulder adduction    Shoulder internal rotation 5/5 5/5  Shoulder external rotation 5/5 4/5  Middle trapezius    Lower trapezius    Elbow flexion 5/5   Elbow extension 5/5   Wrist flexion    Wrist extension    Wrist ulnar deviation    Wrist radial deviation    Wrist pronation    Wrist supination    Grip strength (lbs)    (Blank rows = not tested)  PALPATION:  07/17/2023 Tenderness over pectoralis, upper trapezius, middle traps, levator scapulae and biceps                   TODAY'S TREATMENT:  DATE: 09/03/2023:  Therex: Tband rows blue 3 x 15 bilateral Tband gh ext blue  3 x 15 bilateral Lt shoulder ER walkout isometric green band 10 sec hold x 6 with towel under arm Green band Lt arm ER to neutral with flexion punch  x 10  UBE fwd/back lvl 3.0 UE only with interval faster 10 seconds top of each minute, 4 mins fwd/back each  Supine horizontal abduction blue  band 2 x 15 Supine D2/D1 alternating bilateral blue band 2 x 15 each way  Machine rows 2 x 15 45 lbs  Lat pull down 35 lbs performed to fatigue    TODAY'S TREATMENT:                                                                                                       DATE: 08/29/2023:  Therex: UBE fwd/back 3 mins each way lvl 3.0  Tband rows green 2 x 15 bilateral Tband gh ext green 2 x 15 bilateral Lt shoulder ER walkout isometric green band 5 sec hold x8, with blue band x 2 with towel under arm Lt shoulder IR blue band 2 x 15 c towel under arm Standing 90 deg flexion 2 lb ball small circles in 90 deg flexion 2 x 30 cw, ccw each Wall push up with SA press hold 3 seconds bilateral arm x 15    TODAY'S TREATMENT:                                                                                                       DATE: 07/30/2023:  TherEx:  Pendulums: flexion/extension, abd/add, circles both directions x 2 minutes See objective measurements   Therapeutic Activities: PT demo & verbal cues on positioning LUE in sidelying.  Pt verbalized understanding.   PATIENT EDUCATION: 08/29/2023 Update:  Education details: HEP Person educated: Patient Education method: Programmer, Multimedia, Facilities Manager, Verbal cues, and Handouts Education comprehension: verbalized understanding, returned demonstration, and verbal cues required  HOME EXERCISE PROGRAM: Access Code: [email or web address] URL: [email or web address] Date: 08/29/2023 Prepared by: Ozell Silvan  Exercises - Seated  Scapular Retraction  - 2 x daily - 7 x weekly - 2 sets - 10 reps - 5 seconds hold - Standing Bilateral Low Shoulder Row with Anchored Resistance  - 1-2 x daily - 7 x weekly - 2-3 sets - 10-15 reps - Shoulder Extension with Resistance  - 1-2 x daily - 7 x weekly - 2-3 sets - 10-15 reps - Shoulder External Rotation Reactive Isometrics  - 1 x daily - 7 x weekly - 1 sets - 10 reps - 5-15 hold - Shoulder External Rotation with Anchored Resistance  - 1 x daily -  7 x weekly - 3 sets - 10-15 reps - Shoulder Internal Rotation with Resistance (Mirrored)  - 1-2 x daily - 7 x weekly - 3 sets - 10 reps  ASSESSMENT:  CLINICAL IMPRESSION: Due to tolerance improvements and good replication of HEP, reduced frequency of visits was indicated today and patient was in agreement.   OBJECTIVE IMPAIRMENTS: decreased activity tolerance, decreased knowledge of condition, decreased ROM, decreased strength, increased edema, impaired flexibility, impaired UE functional use, and pain.   ACTIVITY LIMITATIONS: carrying, lifting, bed mobility, reach over head, and exercise  PARTICIPATION LIMITATIONS: cleaning and laundry  PERSONAL FACTORS: Past/current experiences and 1-2 comorbidities: see PMH  are also affecting patient's functional outcome.   REHAB POTENTIAL: Good  CLINICAL DECISION MAKING: Stable/uncomplicated  EVALUATION COMPLEXITY: Low   GOALS: Goals reviewed with patient? Yes  SHORT TERM GOALS: (target date for Short term goals are 3 weeks 08/07/2023)  1.Patient will demonstrate independent use of home exercise program to maintain progress from in clinic treatments. Goal status: MET 07/30/2023  LONG TERM GOALS: (target dates for all long term goals are 8 weeks  09/06/2023)   1. Patient will demonstrate/report pain at worst less than or equal to 2/10 to facilitate minimal limitation in daily activity secondary to pain symptoms. Goal status: Ongoing   08/05/2023   2. Patient will demonstrate independent use  of home exercise program to facilitate ability to maintain/progress functional gains from skilled physical therapy services. Goal status: Ongoing   08/05/2023   3. Patient will demonstrate FOTO outcome > or = 70 % to indicate reduced disability due to condition. Goal status: Ongoing   08/05/2023   4.  Patient will demonstrate left shoulder UE MMT 5/5 throughout to facilitate lifting, reaching, carrying at [facility] in daily activity.   Goal status: Ongoing  08/05/2023   5.  Patient will demonstrate left GH joint AROM WFL s symptoms to facilitate usual overhead reaching, self care, dressing at PLOF.    Goal status: Met 08/29/2023   6.  Patient able to lift 2# to top shelf of upper cabinet.  Goal status: Ongoing 08/05/2023   7.  patient reports able to use LUE with swimming and other exercise equipment per his goal. Goal Status: Ongoing 08/05/2023    PLAN: PT FREQUENCY: 1-2x/week  PT DURATION: 8 weeks  PLANNED INTERVENTIONS: 97110-Therapeutic exercises, 97530- Therapeutic activity, 97112- Neuromuscular re-education, 97535- Self Care, 02859- Manual therapy, 97016- Vasopneumatic device, Patient/Family education, Taping, Joint mobilization, Scar mobilization, Cryotherapy, and Moist heat  PLAN FOR NEXT SESSION:  Add more recent progression of band exercises to home. FOTO update.   Ozell Silvan, PT, DPT, OCS, ATC 09/03/23  10:57 AM

## 2023-09-06 ENCOUNTER — Encounter: Payer: Medicare Other | Admitting: Rehabilitative and Restorative Service Providers"

## 2023-09-10 ENCOUNTER — Ambulatory Visit: Payer: Medicare Other | Admitting: Physical Therapy

## 2023-09-10 ENCOUNTER — Encounter: Payer: Self-pay | Admitting: Physical Therapy

## 2023-09-10 DIAGNOSIS — R600 Localized edema: Secondary | ICD-10-CM | POA: Diagnosis not present

## 2023-09-10 DIAGNOSIS — M25612 Stiffness of left shoulder, not elsewhere classified: Secondary | ICD-10-CM | POA: Diagnosis not present

## 2023-09-10 DIAGNOSIS — M25512 Pain in left shoulder: Secondary | ICD-10-CM

## 2023-09-10 DIAGNOSIS — M6281 Muscle weakness (generalized): Secondary | ICD-10-CM

## 2023-09-10 NOTE — Therapy (Signed)
 OUTPATIENT PHYSICAL THERAPY TREATMENT PROGRESS NOTE/ RE-CERTIFICATION   Patient Name: Douglas Hendricks MRN: [id number] DOB:Dec 21, 1955, 68 y.o., male Today's Date: 09/10/2023  Progress Note Reporting Period 07/17/23  to 09/10/2023   See note below for Objective Data and Assessment of Progress/Goals.      END OF SESSION:  PT End of Session - 09/10/23 1011     Visit Number 7    Number of Visits 15    Date for PT Re-Evaluation 11/08/23    Authorization Type [employer] Medicare    Authorization - Visit Number 7    Authorization - Number of Visits 15    Progress Note Due on Visit 17    PT Start Time 1015    PT Stop Time 1055    PT Time Calculation (min) 40 min    Activity Tolerance Patient tolerated treatment well    Behavior During Therapy WFL for tasks assessed/performed                   Past Medical History:  Diagnosis Date   Arthritis    S/p right hip replacement with redo surgery in 2014   BPH (benign prostatic hyperplasia)    Complication of anesthesia    severe chest pain 2008   Hernia, inguinal, left 2017   Hyperlipidemia    Intolerant to statins-myalgias (atorvastatin 20 mg)   OSA (obstructive sleep apnea)    Thoracic stomach hernia 2015   Past Surgical History:  Procedure Laterality Date   ABDOMINAL HERNIA REPAIR  2015   Umbilical   Cataract surgery Bilateral    COLONOSCOPY  10/2019   Repeated every 3 years   hip pain Bilateral    INGUINAL HERNIA REPAIR Left    KNEE SURGERY Left 1997   SHOULDER ARTHROSCOPY WITH ROTATOR CUFF REPAIR AND SUBACROMIAL DECOMPRESSION Left 07/10/2023   Procedure: LEFT SHOULDER ARTHROSCOPY, EXTENSIVE DEBRIDEMENT, SUBACROMIAL DECOMPRESSION, ROTATOR CUFF REPAIR;  Surgeon: Jerri Kay HERO, MD;  [facility];  Service: Orthopedics;  Laterality: Left;   SHOULDER SURGERY     TOTAL HIP ARTHROPLASTY Right 06/29/2013   [facility]-Dr. Sande (Redo THR)   TYMPANOSTOMY Right 04/2020   Patient Active Problem List    Diagnosis Date Noted   Traumatic tear of supraspinatus tendon, left, initial encounter 07/10/2023   Degenerative tear of glenoid labrum, left 07/10/2023   Adjustment disorder with mixed emotional features 06/18/2023   Elevated coronary artery calcium  score 05/15/2023   Hyperlipidemia LDL goal <70 05/15/2023   Pain in right hip 06/19/2022   Partial tear of left rotator cuff 02/16/2020   Impingement syndrome of left shoulder 02/16/2020   Pain in left shoulder 08/19/2019   Acromioclavicular sprain, left, initial encounter 07/09/2019   Chronic right shoulder pain 08/25/2018   Ventral hernia without obstruction or gangrene 03/18/2014   PCP: Okey Carlin Redbird, MD  REFERRING PROVIDER: Jerri Kay HERO, MD  REFERRING DIAG:  [phone] (ICD-10-CM) - Degenerative tear of glenoid labrum, left  D53.187J (ICD-10-CM) - Traumatic tear of supraspinatus tendon, left, initial encounter   THERAPY DIAG:  No diagnosis found.  Rationale for Evaluation and Treatment: Rehabilitation  ONSET DATE: 07/10/2023 RTC repair surgery  SUBJECTIVE:  SUBJECTIVE STATEMENT: Pt has been progressing toward a gym based program. Pain being his limiting factor. Pt reporting worse pain over the weekend was 3/10.    Hand dominance: Right  PERTINENT HISTORY: left RTC repair 07/10/23, bilateral THA 2015 & 2014, right RTC repair ~2018, abdominal hernia repair,   PAIN:   NPRS scale: 1/10 upon arrival, up to 3/10 at worst.  Pain location: left shoulder more anterior & posterior over muscle areas Pain description: ache Aggravating factors: minor movement like putting shirt on Relieving factors: wearing sling & minimize movement  PRECAUTIONS: Shoulder  RED FLAGS: None   WEIGHT BEARING RESTRICTIONS: No  FALLS:  Has patient fallen in last 6  months? No  LIVING ENVIRONMENT: Lives with: lives alone Lives in: Mobile home loft extra sleeping Stairs: Yes: External: 0 steps; none  OCCUPATION: Retired  PLOF: Independent  PATIENT GOALS: to use arm to swim, exercise and function with ADLs  NEXT MD VISIT: 07/18/2023  OBJECTIVE:  Note: Objective measures were completed at Evaluation unless otherwise noted.  PATIENT SURVEYS :  07/17/2023 FOTO 52% with target 70% 09/10/23:  FOTO 64%  COGNITION: 07/17/2023 Overall cognitive status: Within functional limits for tasks assessed     SENSATION: 07/17/2023 [facility]  POSTURE: 07/17/2023 Head forward and rounded shoulder  UPPER EXTREMITY ROM:    ROM Left 07/17/2023 Passive Left 07/30/23 Passive Left 08/05/23 Passive Left 08/29/2023 AROM In supine Left 09/10/23  Shoulder flexion 69* 149* 151* 175 175  Shoulder extension       Shoulder abduction 90* 119* 168* 170 172  Shoulder adduction       Shoulder internal rotation 30* 55* 66* 80 in 45 deg abduction   Shoulder external rotation 30* 48* 51* 85 in 45 deg abduction 85 deg shoulder abd 45 deg  Elbow flexion       Elbow extension       Wrist flexion       Wrist extension       Wrist ulnar deviation       Wrist radial deviation       Wrist pronation       Wrist supination       (Blank rows = not tested)  UPPER EXTREMITY MMT: 07/17/2023 Left shoulder MMT Not Tested at eval due to time since sg  MMT 08/29/2023 Right 08/29/2023 Left 09/10/23 Left  Shoulder flexion 5/5 4/5 4/5  Shoulder extension     Shoulder abduction 5/5 3+/5 3+/5  Shoulder adduction     Shoulder internal rotation 5/5 5/5 5/5  Shoulder external rotation 5/5 4/5 4+/5  Middle trapezius     Lower trapezius     Elbow flexion 5/5    Elbow extension 5/5    Wrist flexion     Wrist extension     Wrist ulnar deviation     Wrist radial deviation     Wrist pronation     Wrist supination     Grip strength (lbs)     (Blank rows = not  tested)  PALPATION:  07/17/2023 Tenderness over pectoralis, upper trapezius, middle traps, levator scapulae and biceps                  TODAY'S TREATMENT:  DATE: 09/10/2023:  TherEx:  UBE: level 3 x 4 minutes each direction Standing flexion 3 # x 10 bil UE Standing lifting 3# onto 3nd gym shelf x 10 and 3rd shelf x 10  BATCA rows: 55#  x 15, 65# x 15 holding 3 sec each set BATCA lat pull downs: 45# 2 x 15  BATCA diagonals 5 # : wood chop bil UE's 2 x 10 each direction Side-lying abd no weight: pain free upper arc of motion 2 x 10   Updated MMT and ROM measurements see chart above  Manual:  IASTM to left deltoid, following manual therapy pt able to abduction his left shoulder with no pain.       TODAY'S TREATMENT:                                                                                                       DATE: 09/03/2023:  Therex: Tband rows blue 3 x 15 bilateral Tband gh ext blue  3 x 15 bilateral Lt shoulder ER walkout isometric green band 10 sec hold x 6 with towel under arm Green band Lt arm ER to neutral with flexion punch  x 10  UBE fwd/back lvl 3.0 UE only with interval faster 10 seconds top of each minute, 4 mins fwd/back each  Supine horizontal abduction blue  band 2 x 15 Supine D2/D1 alternating bilateral blue band 2 x 15 each way  Machine rows 2 x 15 45 lbs  Lat pull down 35 lbs performed to fatigue    TODAY'S TREATMENT:                                                                                                       DATE: 08/29/2023:  Therex: UBE fwd/back 3 mins each way lvl 3.0  Tband rows green 2 x 15 bilateral Tband gh ext green 2 x 15 bilateral Lt shoulder ER walkout isometric green band 5 sec hold x8, with blue band x 2 with towel under arm Lt shoulder IR blue band 2 x 15 c towel under arm Standing 90 deg flexion 2 lb ball small circles in 90 deg  flexion 2 x 30 cw, ccw each Wall push up with SA press hold 3 seconds bilateral arm x 15    TODAY'S TREATMENT:  DATE: 07/30/2023:  TherEx:  Pendulums: flexion/extension, abd/add, circles both directions x 2 minutes See objective measurements   Therapeutic Activities: PT demo & verbal cues on positioning LUE in sidelying.  Pt verbalized understanding.   PATIENT EDUCATION: 08/29/2023 Update:  Education details: HEP Person educated: Patient Education method: Programmer, Multimedia, Facilities Manager, Verbal cues, and Handouts Education comprehension: verbalized understanding, returned demonstration, and verbal cues required  HOME EXERCISE PROGRAM: Access Code: [email or web address] URL: [email or web address] Date: 08/29/2023 Prepared by: Ozell Silvan  Exercises - Seated Scapular Retraction  - 2 x daily - 7 x weekly - 2 sets - 10 reps - 5 seconds hold - Standing Bilateral Low Shoulder Row with Anchored Resistance  - 1-2 x daily - 7 x weekly - 2-3 sets - 10-15 reps - Shoulder Extension with Resistance  - 1-2 x daily - 7 x weekly - 2-3 sets - 10-15 reps - Shoulder External Rotation Reactive Isometrics  - 1 x daily - 7 x weekly - 1 sets - 10 reps - 5-15 hold - Shoulder External Rotation with Anchored Resistance  - 1 x daily - 7 x weekly - 3 sets - 10-15 reps - Shoulder Internal Rotation with Resistance (Mirrored)  - 1-2 x daily - 7 x weekly - 3 sets - 10 reps  ASSESSMENT:  CLINICAL IMPRESSION: Pt has improved his FOTO from 52% to 64%. Pt still limited with left shoulder strength. Pt experiencing pain with flexion and abduction. I am recommending extension to pt's certification date for pt to progress toward his LTG's set.      OBJECTIVE IMPAIRMENTS: decreased activity tolerance, decreased knowledge of condition, decreased ROM, decreased strength, increased edema, impaired flexibility, impaired UE  functional use, and pain.   ACTIVITY LIMITATIONS: carrying, lifting, bed mobility, reach over head, and exercise  PARTICIPATION LIMITATIONS: cleaning and laundry  PERSONAL FACTORS: Past/current experiences and 1-2 comorbidities: see PMH  are also affecting patient's functional outcome.   REHAB POTENTIAL: Good  CLINICAL DECISION MAKING: Stable/uncomplicated  EVALUATION COMPLEXITY: Low   GOALS: Goals reviewed with patient? Yes  SHORT TERM GOALS: (target date for Short term goals are 3 weeks 08/07/2023)  1.Patient will demonstrate independent use of home exercise program to maintain progress from in clinic treatments. Goal status: MET 07/30/2023  LONG TERM GOALS: (target dates for all long term goals are 8 weeks  11/08/2023)   1. Patient will demonstrate/report pain at worst less than or equal to 2/10 to facilitate minimal limitation in daily activity secondary to pain symptoms. Goal status: Ongoing   09/10/23   2. Patient will demonstrate independent use of home exercise program to facilitate ability to maintain/progress functional gains from skilled physical therapy services. Goal status: Ongoing    09/10/23   3. Patient will demonstrate FOTO outcome > or = 70 % to indicate reduced disability due to condition. Goal status: Ongoing    09/10/23   4.  Patient will demonstrate left shoulder UE MMT 5/5 throughout to facilitate lifting, reaching, carrying at [facility] in daily activity.   Goal status: Ongoing   09/10/23   5.  Patient will demonstrate left GH joint AROM WFL s symptoms to facilitate usual overhead reaching, self care, dressing at PLOF.    Goal status: Met 08/29/2023   6.  Patient able to lift 2# to top shelf of upper cabinet.  Goal status: Met 09/10/23   7.  patient reports able to use LUE with swimming and other exercise equipment per his goal. Goal Status: Ongoing  09/10/23    PLAN:  PT FREQUENCY: 1x/ week   PT DURATION: 8 weeks  PLANNED INTERVENTIONS:  97110-Therapeutic exercises, 97530- Therapeutic activity, 97112- Neuromuscular re-education, 97535- Self Care, 02859- Manual therapy, 97016- Vasopneumatic device, Patient/Family education, Taping, Joint mobilization, Scar mobilization, Cryotherapy, and Moist heat  PLAN FOR NEXT SESSION:  Add more recent progression of band exercises to home. Re-cert performed on 09/10/23, FOTO updated on 09/10/23    Delon Lunger, PT, MPT 09/10/23 11:43 AM   09/10/23  11:43 AM

## 2023-09-12 ENCOUNTER — Encounter: Payer: Self-pay | Admitting: Physical Therapy

## 2023-09-17 ENCOUNTER — Encounter: Payer: Self-pay | Admitting: Cardiology

## 2023-09-17 ENCOUNTER — Ambulatory Visit: Payer: Medicare Other | Admitting: Cardiology

## 2023-09-17 ENCOUNTER — Encounter: Payer: Medicare Other | Admitting: Physical Therapy

## 2023-09-18 ENCOUNTER — Encounter: Payer: Self-pay | Admitting: Cardiology

## 2023-09-20 NOTE — Telephone Encounter (Signed)
He is fine to do what ever he wants to do.  The monitor was relatively reassuring.  His Coronary CTA was also reassuring.   Bryan Lemma, MD

## 2023-09-22 NOTE — Progress Notes (Unsigned)
Post-Op Visit Note   Patient: Douglas Hendricks           Date of Birth: [birth year]           MRN: [id number] Visit Date: 09/24/2023 PCP: [name], MD   Assessment & Plan:  Chief Complaint: No chief complaint on file.  Visit Diagnoses:  1. Traumatic tear of supraspinatus tendon, left, initial encounter   2. Impingement syndrome of left shoulder   3. Degenerative tear of glenoid labrum, left     Plan: Aubra returns today for 12 week postop check of left rotator cuff repair.    ***  Follow-Up Instructions: No follow-ups on file.   Orders:  No orders of the defined types were placed in this encounter.  No orders of the defined types were placed in this encounter.   Imaging: No results found.  PMFS History: Patient Active Problem List   Diagnosis Date Noted   Traumatic tear of supraspinatus tendon, left, initial encounter 07/10/2023   Degenerative tear of glenoid labrum, left 07/10/2023   Adjustment disorder with mixed emotional features 06/18/2023   Elevated coronary artery calcium score 05/15/2023   Hyperlipidemia LDL goal <70 05/15/2023   Pain in right hip 06/19/2022   Partial tear of left rotator cuff 02/16/2020   Impingement syndrome of left shoulder 02/16/2020   Pain in left shoulder 08/19/2019   Acromioclavicular sprain, left, initial encounter 07/09/2019   Chronic right shoulder pain 08/25/2018   Ventral hernia without obstruction or gangrene 03/18/2014   Past Medical History:  Diagnosis Date   Arthritis    S/p right hip replacement with redo surgery in 2014   BPH (benign prostatic hyperplasia)    Complication of anesthesia    severe chest pain 2008   Hernia, inguinal, left 2017   Hyperlipidemia    Intolerant to statins-myalgias (atorvastatin 20 mg)   OSA (obstructive sleep apnea)    Thoracic stomach hernia 2015    Family History  Problem Relation Age of Onset   Cancer Mother        pancreatic   COPD Father    Cancer Father         esophageal   Ovarian cancer Sister        In remission x 15 years   Throat cancer Sister    Healthy Sister    Valvular heart disease Brother        Heart valve surgery   Hyperlipidemia Brother    Other Brother        Low back pain from spinal:/Spinal stenosis   Glaucoma Brother     Past Surgical History:  Procedure Laterality Date   ABDOMINAL HERNIA REPAIR  2015   Umbilical   Cataract surgery Bilateral    COLONOSCOPY  10/2019   Repeated every 3 years   hip pain Bilateral    INGUINAL HERNIA REPAIR Left    KNEE SURGERY Left 1997   SHOULDER ARTHROSCOPY WITH ROTATOR CUFF REPAIR AND SUBACROMIAL DECOMPRESSION Left 07/10/2023   Procedure: LEFT SHOULDER ARTHROSCOPY, EXTENSIVE DEBRIDEMENT, SUBACROMIAL DECOMPRESSION, ROTATOR CUFF REPAIR;  Surgeon: Tarry Kos, MD;  Location: [facility];  Service: Orthopedics;  Laterality: Left;   SHOULDER SURGERY     TOTAL HIP ARTHROPLASTY Right 06/29/2013   [facility]-Dr. Everardo Beals (Redo THR)   TYMPANOSTOMY Right 04/2020   Social History   Occupational History   Not on file  Tobacco Use   Smoking status: Never   Smokeless tobacco: Never   Tobacco comments:  Drinks 2 12 ounce servings of coffee a day.  Vaping Use   Vaping status: Never Used  Substance and Sexual Activity   Alcohol use: Yes    Alcohol/week: 4.0 standard drinks of alcohol    Types: 4 Standard drinks or equivalent per week    Comment: 1-2   Drug use: No   Sexual activity: Not on file

## 2023-09-23 DIAGNOSIS — E785 Hyperlipidemia, unspecified: Secondary | ICD-10-CM | POA: Diagnosis not present

## 2023-09-23 LAB — LIPID PANEL
Chol/HDL Ratio: 2.4 {ratio} (ref 0.0–5.0)
Cholesterol, Total: 142 mg/dL (ref 100–199)
HDL: 59 mg/dL (ref >39)
LDL Chol Calc (NIH): 69 mg/dL (ref 0–99)
Triglycerides: 73 mg/dL (ref 0–149)
VLDL Cholesterol Cal: 14 mg/dL (ref 5–40)

## 2023-09-24 ENCOUNTER — Ambulatory Visit: Payer: Medicare Other | Admitting: Licensed Clinical Social Worker

## 2023-09-24 ENCOUNTER — Ambulatory Visit: Payer: Medicare Other | Admitting: Physical Therapy

## 2023-09-24 ENCOUNTER — Ambulatory Visit (INDEPENDENT_AMBULATORY_CARE_PROVIDER_SITE_OTHER): Payer: Medicare Other | Admitting: Orthopaedic Surgery

## 2023-09-24 ENCOUNTER — Encounter: Payer: Self-pay | Admitting: Physical Therapy

## 2023-09-24 DIAGNOSIS — H9313 Tinnitus, bilateral: Secondary | ICD-10-CM | POA: Diagnosis not present

## 2023-09-24 DIAGNOSIS — M25512 Pain in left shoulder: Secondary | ICD-10-CM | POA: Diagnosis not present

## 2023-09-24 DIAGNOSIS — F4329 Adjustment disorder with other symptoms: Secondary | ICD-10-CM

## 2023-09-24 DIAGNOSIS — M6281 Muscle weakness (generalized): Secondary | ICD-10-CM

## 2023-09-24 DIAGNOSIS — S46812A Strain of other muscles, fascia and tendons at shoulder and upper arm level, left arm, initial encounter: Secondary | ICD-10-CM

## 2023-09-24 DIAGNOSIS — M24112 Other articular cartilage disorders, left shoulder: Secondary | ICD-10-CM

## 2023-09-24 DIAGNOSIS — F4325 Adjustment disorder with mixed disturbance of emotions and conduct: Secondary | ICD-10-CM

## 2023-09-24 DIAGNOSIS — R600 Localized edema: Secondary | ICD-10-CM

## 2023-09-24 DIAGNOSIS — H903 Sensorineural hearing loss, bilateral: Secondary | ICD-10-CM | POA: Diagnosis not present

## 2023-09-24 DIAGNOSIS — M25612 Stiffness of left shoulder, not elsewhere classified: Secondary | ICD-10-CM

## 2023-09-24 DIAGNOSIS — M7542 Impingement syndrome of left shoulder: Secondary | ICD-10-CM

## 2023-09-24 DIAGNOSIS — H90A22 Sensorineural hearing loss, unilateral, left ear, with restricted hearing on the contralateral side: Secondary | ICD-10-CM | POA: Diagnosis not present

## 2023-09-24 DIAGNOSIS — Z9889 Other specified postprocedural states: Secondary | ICD-10-CM | POA: Diagnosis not present

## 2023-09-24 DIAGNOSIS — H61813 Exostosis of external canal, bilateral: Secondary | ICD-10-CM | POA: Diagnosis not present

## 2023-09-24 NOTE — Progress Notes (Signed)
[facility] Counselor/Therapist Progress Note  Patient ID: Douglas Hendricks, MRN: [id number]    Date: 09/24/23  Time Spent: 0902  am - 0956am : 54 Minutes  Treatment Type: Individual Therapy.   Reported Symptoms: Symptoms of depression and anxiety related to anxiety.   Mental Status Exam: Appearance:  Casual and Disheveled     Behavior: Appropriate  Motor: Normal  Speech/Language:  Clear and Coherent  Affect: Flat  Mood: depressed  Thought process: normal  Thought content:   WNL  Sensory/Perceptual disturbances:   WNL  Orientation: oriented to person, place, time/date, situation, day of week, month of year, and year  Attention: Good  Concentration: Good  Memory: WNL  Fund of knowledge:  Good  Insight:   Good  Judgment:  Good  Impulse Control: Good    Risk Assessment: Danger to Self:  No Self-injurious Behavior: No Danger to Others: No Duty to Warn:no Physical Aggression / Violence:No  Access to Firearms a concern: No  Gang Involvement:No    Subjective:    Douglas Hendricks participated from office, located at [facility] with Clinician present. Douglas Hendricks consented to treatment.   Douglas Hendricks presented for his session with a persistent flat affect. Patient reports that he was in [place] the lst couple of weeks with his brother and his family. Patient states that he has only spoke to Douglas Hendricks a few times and returned a suitcase he borrowed from her for his trip. Patient reports that he is leaving for [place] next week for 3 weeks ot see how he will do in another country on his own. He reports that he plans to move to [place] or [place] if things work out. He stated that once he returns from [place] he will get his home prepared for sale and then begin to prepare for his move. He reports that he is both excited and apprehensive. Patient states that he does recognize that when it comes to his religion and politics he can be very rigid, but in other areas he can be passive. We  discussed the importance of assertiveness and what it means to be assertive as well as how being assertive can prevent [place] from holding things in and then exploding.  Clinician actively engaged in discussion with patient and actively listened to him and his valid concerns. Clinician identified assertiveness as a Insurance underwriter that involves expressing your feelings and opinions in a direct and honest way, while also respecting others. It can help you build self-confidence, reduce conflict, and improve your relationships.  Clinician encouraged patient to practice assertiveness with others.    Patient is encouraged to work on the following goals in order to improve his future relationships.   Communication: Learning active listening skills  Expressing needs clearly and respectfully  Avoiding negative communication patterns like criticism and defensiveness  Emotional Intimacy: Sharing feelings openly and honestly  Building trust and vulnerability  Understanding and validating each other's emotions  Conflict Resolution: Identifying triggers and patterns of conflict  Learning to compromise and find solutions together  Managing anger and frustration effectively    Patient will continue CBT therapy biweekly. Treatment plan will be reviewed by 06/17/2024.    Interventions: Cognitive Behavioral Therapy   Diagnosis: Adjustment Disorder with mixed emotions.   Douglas Hendricks MSW, LCSW/DATE 09/24/2023

## 2023-09-24 NOTE — Therapy (Signed)
OUTPATIENT PHYSICAL THERAPY TREATMENT   Patient Name: Douglas Hendricks MRN: [id number] DOB:03/29/1956, 68 y.o., male Today's Date: 09/24/2023  END OF SESSION:  PT End of Session - 09/24/23 1114     Visit Number 8    Number of Visits 15    Date for PT Re-Evaluation 11/08/23    Authorization Type [employer] Medicare    Authorization - Visit Number 8    Authorization - Number of Visits 15    Progress Note Due on Visit 17    PT Start Time 1115    PT Stop Time 1216    PT Time Calculation (min) 61 min    Activity Tolerance Patient tolerated treatment well    Behavior During Therapy WFL for tasks assessed/performed                    Past Medical History:  Diagnosis Date   Arthritis    S/p right hip replacement with redo surgery in 2014   BPH (benign prostatic hyperplasia)    Complication of anesthesia    severe chest pain 2008   Hernia, inguinal, left 2017   Hyperlipidemia    Intolerant to statins-myalgias (atorvastatin 20 mg)   OSA (obstructive sleep apnea)    Thoracic stomach hernia 2015   Past Surgical History:  Procedure Laterality Date   ABDOMINAL HERNIA REPAIR  2015   Umbilical   Cataract surgery Bilateral    COLONOSCOPY  10/2019   Repeated every 3 years   hip pain Bilateral    INGUINAL HERNIA REPAIR Left    KNEE SURGERY Left 1997   SHOULDER ARTHROSCOPY WITH ROTATOR CUFF REPAIR AND SUBACROMIAL DECOMPRESSION Left 07/10/2023   Procedure: LEFT SHOULDER ARTHROSCOPY, EXTENSIVE DEBRIDEMENT, SUBACROMIAL DECOMPRESSION, ROTATOR CUFF REPAIR;  Surgeon: Tarry Kos, MD;  Location: [facility];  Service: Orthopedics;  Laterality: Left;   SHOULDER SURGERY     TOTAL HIP ARTHROPLASTY Right 06/29/2013   [facility]-Dr. Everardo Beals (Redo THR)   TYMPANOSTOMY Right 04/2020   Patient Active Problem List   Diagnosis Date Noted   Traumatic tear of supraspinatus tendon, left, initial encounter 07/10/2023   Degenerative tear of glenoid labrum, left 07/10/2023   Adjustment  disorder with mixed emotional features 06/18/2023   Elevated coronary artery calcium score 05/15/2023   Hyperlipidemia LDL goal <70 05/15/2023   Pain in right hip 06/19/2022   Partial tear of left rotator cuff 02/16/2020   Impingement syndrome of left shoulder 02/16/2020   Pain in left shoulder 08/19/2019   Acromioclavicular sprain, left, initial encounter 07/09/2019   Chronic right shoulder pain 08/25/2018   Ventral hernia without obstruction or gangrene 03/18/2014   PCP: [name], MD  REFERRING PROVIDER: Tarry Kos, MD  REFERRING DIAG:  [phone] (ICD-10-CM) - Degenerative tear of glenoid labrum, left  B14.782N (ICD-10-CM) - Traumatic tear of supraspinatus tendon, left, initial encounter   THERAPY DIAG:  No diagnosis found.  Rationale for Evaluation and Treatment: Rehabilitation  ONSET DATE: 07/10/2023 RTC repair surgery  SUBJECTIVE:  SUBJECTIVE STATEMENT: Dr. Roda Shutters thinks that his shoulder is progressing well.  Hand dominance: Right  PERTINENT HISTORY: left RTC repair 07/10/23, bilateral THA 2015 & 2014, right RTC repair ~2018, abdominal hernia repair,   PAIN:  NPRS scale:  0/10 upon arrival, up to 3/10 at worst.  Pain location: left shoulder more anterior & posterior over muscle areas Pain description: ache Aggravating factors: minor movement like putting shirt on Relieving factors: wearing sling & minimize movement  PRECAUTIONS: Shoulder  RED FLAGS: None   WEIGHT BEARING RESTRICTIONS: No  FALLS:  Has patient fallen in last 6 months? No  LIVING ENVIRONMENT: Lives with: lives alone Lives in: Mobile home loft extra sleeping Stairs: Yes: External: 0 steps; none  OCCUPATION: Retired  PLOF: Independent  PATIENT GOALS: to use arm to swim, exercise and function with  ADLs  NEXT MD VISIT: 07/18/2023  OBJECTIVE:  Note: Objective measures were completed at Evaluation unless otherwise noted.  PATIENT SURVEYS :  07/17/2023 FOTO 52% with target 70% 09/10/23:  FOTO 64%  COGNITION: 07/17/2023 Overall cognitive status: Within functional limits for tasks assessed     SENSATION: 07/17/2023 [facility]  POSTURE: 07/17/2023 Head forward and rounded shoulder  UPPER EXTREMITY ROM:    ROM Left 07/17/2023 Passive Left 07/30/23 Passive Left 08/05/23 Passive Left 08/29/2023 AROM In supine Left 09/10/23  Shoulder flexion 69* 149* 151* 175 175  Shoulder extension       Shoulder abduction 90* 119* 168* 170 172  Shoulder adduction       Shoulder internal rotation 30* 55* 66* 80 in 45 deg abduction   Shoulder external rotation 30* 48* 51* 85 in 45 deg abduction 85 deg shoulder abd 45 deg  Elbow flexion       Elbow extension       Wrist flexion       Wrist extension       Wrist ulnar deviation       Wrist radial deviation       Wrist pronation       Wrist supination       (Blank rows = not tested)  UPPER EXTREMITY MMT: 07/17/2023 Left shoulder MMT Not Tested at eval due to time since sg  MMT 08/29/2023 Right 08/29/2023 Left 09/10/23 Left  Shoulder flexion 5/5 4/5 4/5  Shoulder extension     Shoulder abduction 5/5 3+/5 3+/5  Shoulder adduction     Shoulder internal rotation 5/5 5/5 5/5  Shoulder external rotation 5/5 4/5 4+/5  Middle trapezius     Lower trapezius     Elbow flexion 5/5    Elbow extension 5/5    Wrist flexion     Wrist extension     Wrist ulnar deviation     Wrist radial deviation     Wrist pronation     Wrist supination     Grip strength (lbs)     (Blank rows = not tested)  PALPATION:  07/17/2023 Tenderness over pectoralis, upper trapezius, middle traps, levator scapulae and biceps                  TODAY'S TREATMENT:  DATE:  09/10/2023:  TherEx:  Pulleys flexion / extension 4 min and scaption to internal rotation behind hip 5 min.  PT added to HEP with HO & pt verbalized understanding.  Sidelying shoulder abduction and external rotation 15 reps 2 sets.PT added to HEP with HO & pt verbalized understanding.  Standing shoulder flexion & scaption 3# tapping to 1st & 3rd (overhead) shelf 10 reps PT added to HEP with HO & pt verbalized understanding.  Standing internal rotation / reaching hand behind back - initially with towel then AROM Push up from counter 15 reps PT reviewed resistive exercises & reactive isometrics with Tband from HEP with reducing to 3x/wk.  Pt verbalized understanding.      TREATMENT:                                                                                                       DATE: 09/10/2023:  TherEx:  UBE: level 3 x 4 minutes each direction Standing flexion 3 # x 10 bil UE Standing lifting 3# onto 3nd gym shelf x 10 and 3rd shelf x 10  BATCA rows: 55#  x 15, 65# x 15 holding 3 sec each set BATCA lat pull downs: 45# 2 x 15  BATCA diagonals 5 # : wood chop bil UE's 2 x 10 each direction Side-lying abd no weight: pain free upper arc of motion 2 x 10   Updated MMT and ROM measurements see chart above  Manual:  IASTM to left deltoid, following manual therapy pt able to abduction his left shoulder with no pain.     TREATMENT:                                                                                                       DATE: 09/03/2023:  Therex: Tband rows blue 3 x 15 bilateral Tband gh ext blue  3 x 15 bilateral Lt shoulder ER walkout isometric green band 10 sec hold x 6 with towel under arm Green band Lt arm ER to neutral with flexion punch  x 10  UBE fwd/back lvl 3.0 UE only with interval faster 10 seconds top of each minute, 4 mins fwd/back each  Supine horizontal abduction blue  band 2 x 15 Supine D2/D1 alternating bilateral blue band 2 x 15 each way  Machine rows 2 x 15  45 lbs  Lat pull down 35 lbs performed to fatigue    TREATMENT:  DATE: 08/29/2023:  Therex: UBE fwd/back 3 mins each way lvl 3.0  Tband rows green 2 x 15 bilateral Tband gh ext green 2 x 15 bilateral Lt shoulder ER walkout isometric green band 5 sec hold x8, with blue band x 2 with towel under arm Lt shoulder IR blue band 2 x 15 c towel under arm Standing 90 deg flexion 2 lb ball small circles in 90 deg flexion 2 x 30 cw, ccw each Wall push up with SA press hold 3 seconds bilateral arm x 15    PATIENT EDUCATION: 08/29/2023 Update:  Education details: HEP Person educated: Patient Education method: Programmer, multimedia, Facilities manager, Verbal cues, and Handouts Education comprehension: verbalized understanding, returned demonstration, and verbal cues required  HOME EXERCISE PROGRAM: Access Code: [email or web address] URL: [email or web address] Date: 09/24/2023 Prepared by: Vladimir Faster  Exercises - Standing Bilateral Low Shoulder Row with Anchored Resistance  - 1-2 x daily - 3 x weekly - 2-3 sets - 10-15 reps - Shoulder Extension with Resistance  - 1-2 x daily - 3 x weekly - 2-3 sets - 10-15 reps - Shoulder External Rotation with Anchored Resistance  - 1 x daily - 3 x weekly - 3 sets - 10-15 reps - Shoulder Internal Rotation with Resistance (Mirrored)  - 1-2 x daily - 3 x weekly - 3 sets - 10 reps - Shoulder External Rotation Reactive Isometrics  - 1 x daily - 3 x weekly - 1 sets - 10 reps - 5-15 hold - walking balance with band behind you  - 1 x daily - 3 x weekly - 1 sets - 10 reps - 5 seconds hold - walking balance with band to your left  - 1 x daily - 3 x weekly - 1 sets - 10 reps - 5 seconds hold - Standing Shoulder Row Reactive Isometric  - 1 x daily - 3 x weekly - 1 sets - 10 reps - 5 seconds hold - Seated Shoulder Flexion AAROM with Pulley Behind  - 1 x daily - 5-7 x weekly - 1 sets -  5 minutes hold - Seated Shoulder Scaption AAROM with Pulley at Side  - 1 x daily - 5-7 x weekly - 1 sets - 5 minutes hold - Sidelying Shoulder Abduction Palm Forward  - 1 x daily - 5-7 x weekly - 2 sets - 15 reps - 2 seconds hold - Sidelying Shoulder External Rotation  - 1 x daily - 5-7 x weekly - 2 sets - 15 reps - 2 seconds hold - Single Arm Shoulder Flexion with Dumbbell  - 1 x daily - 5-7 x weekly - 1-2 sets - 15 reps - Standing Single Arm Shoulder Scaption with Dumbbell  - 1 x daily - 5-7 x weekly - 1-2 sets - 15 reps  ASSESSMENT:  CLINICAL IMPRESSION: PT updated HEP to include more strengthening & muscle endurance focus.  He appears to understand.    OBJECTIVE IMPAIRMENTS: decreased activity tolerance, decreased knowledge of condition, decreased ROM, decreased strength, increased edema, impaired flexibility, impaired UE functional use, and pain.   ACTIVITY LIMITATIONS: carrying, lifting, bed mobility, reach over head, and exercise  PARTICIPATION LIMITATIONS: cleaning and laundry  PERSONAL FACTORS: Past/current experiences and 1-2 comorbidities: see PMH  are also affecting patient's functional outcome.   REHAB POTENTIAL: Good  CLINICAL DECISION MAKING: Stable/uncomplicated  EVALUATION COMPLEXITY: Low   GOALS: Goals reviewed with patient? Yes  SHORT TERM GOALS: (target date for Short term goals are 3 weeks 08/07/2023)  1.Patient will demonstrate independent use  of home exercise program to maintain progress from in clinic treatments. Goal status: MET 07/30/2023  LONG TERM GOALS: (target dates for all long term goals are 8 weeks  11/08/2023)   1. Patient will demonstrate/report pain at worst less than or equal to 2/10 to facilitate minimal limitation in daily activity secondary to pain symptoms. Goal status: Ongoing   09/24/2023   2. Patient will demonstrate independent use of home exercise program to facilitate ability to maintain/progress functional gains from skilled  physical therapy services. Goal status: Ongoing  09/24/2023   3. Patient will demonstrate FOTO outcome > or = 70 % to indicate reduced disability due to condition. Goal status: Ongoing   09/24/2023   4.  Patient will demonstrate left shoulder UE MMT 5/5 throughout to facilitate lifting, reaching, carrying at [facility] in daily activity.   Goal status: Ongoing  09/24/2023   5.  Patient will demonstrate left GH joint AROM WFL s symptoms to facilitate usual overhead reaching, self care, dressing at PLOF.    Goal status: Met 08/29/2023   6.  Patient able to lift 2# to top shelf of upper cabinet.  Goal status: Met 09/10/23   7.  patient reports able to use LUE with swimming and other exercise equipment per his goal. Goal Status: Ongoing  09/24/2023    PLAN: PT FREQUENCY: 1x/ week   PT DURATION: 8 weeks  PLANNED INTERVENTIONS: 97110-Therapeutic exercises, 97530- Therapeutic activity, 97112- Neuromuscular re-education, 97535- Self Care, 16109- Manual therapy, 97016- Vasopneumatic device, Patient/Family education, Taping, Joint mobilization, Scar mobilization, Cryotherapy, and Moist heat  PLAN FOR NEXT SESSION:  Patient is going to [place] for 3 weeks.  Next visit check and update HEP for plan while on vacation.  PT visit after vacation to check shoulder progress further vs discharge.    Vladimir Faster, PT, DPT 09/24/2023, 12:34 PM

## 2023-09-27 ENCOUNTER — Ambulatory Visit: Payer: Medicare Other | Attending: Cardiology | Admitting: Cardiology

## 2023-09-27 VITALS — BP 100/60 | HR 53 | Ht 71.0 in | Wt 207.0 lb

## 2023-09-27 DIAGNOSIS — R931 Abnormal findings on diagnostic imaging of heart and coronary circulation: Secondary | ICD-10-CM | POA: Diagnosis not present

## 2023-09-27 DIAGNOSIS — R002 Palpitations: Secondary | ICD-10-CM | POA: Diagnosis not present

## 2023-09-27 DIAGNOSIS — E785 Hyperlipidemia, unspecified: Secondary | ICD-10-CM

## 2023-09-27 NOTE — Progress Notes (Unsigned)
Cardiology Office Note:  .   Date:  09/30/2023  ID:  Douglas Hendricks, DOB Apr 14, 1956, MRN [id number] PCP: Douglas Floro, MD  [facility] Providers Cardiologist:  Douglas Lemma, MD     Chief Complaint  Patient presents with   Follow-up   Palpitations    Monitor reviewed   Coronary Artery Disease    No angina.  Still active    Patient Profile: .     Douglas Hendricks is a borderline obese but otherwise healthy 68 y.o. male non-smoker with a PMH notable for HLD with statin intolerance, OSA on CPAP with elevated Coronary Calcium Score of 189.  He presents here for 23-month follow-up to discuss test results at the request of Douglas Floro, MD.     Douglas Hendricks was seen on May 15, 2023 for initial consultation with a Coronary Calcium Score 189.  He indicated that he is very active and exercises vigorously.  He had 1 episode of shortness breath and chest tightness his heart rate bilaterally 70 bpm but otherwise was asymptomatic.  They also noted that he had issues with statins in the past, but was willing to retry Crestor.  We also started aspirin. => He called in during early December indicating that he was feeling irregular heartbeats occurring several times a week feeling a fluttering sensation. => 14-day Zio patch monitor ordered.  (Reviewed below)  Subjective  Discussed the use of AI scribe software for clinical note transcription with the patient, who gave verbal consent to proceed.  History of Present Illness   The patient is a 68 year old male who presents for follow-up regarding elevated coronary calcium score.  He has an elevated coronary calcium score, which is above the desired level but not excessively high for his age. He does not smoke and manages his cholesterol and blood pressure effectively. His blood sugars are controlled, and recent cholesterol levels have shown significant improvement since the last check. He is currently on 20 mg of Crestor (rosuvastatin)  daily, taken in the morning, with no side effects such as muscle aches or cramping. He also takes aspirin prophylactically due to his coronary calcium score being closer to 200.  He experiences rare premature beats, both atrial and ventricular, with the longest episode lasting 15 seconds at a rate of 104 beats per minute. No significant symptoms occur during these episodes.  He is active and exercises regularly, achieving heart rates up to 157 beats per minute without symptoms, indicating good exercise tolerance.  He denies any other cardiac symptoms besides "skipped beats".  No prolonged irregular heartbeats/rhythms -- not fast or irregular.  No chest pain/pressure or dyspnea at rest or with exertion.  No PND, orthopnea or edema.  No syncope/near syncope, TIA/CVA/amaurosis fugax. No claudication.  He is planning a trip to [place] and moderates' alcohol intake, as excessive consumption could exacerbate arrhythmias. He does not smoke and avoids sugary, fruity drinks.        Objective   Medications - Crestor 20 mg daily - Aspirin 81 mg daily - - Alfuzosin 10 mg BID - Cialis 5 mg PRN  Studies Reviewed: .        14-day Zio patch monitor (December 10-24, 2024)    Predominant underlying rhythm is sinus with a heart rate range of 37 to 157 bpm and an average of 65 bpm.   There are rare (< 1%) premature atrial and premature ventricular contractions with rare couplets and triplets of both.  110-second episode of ventricular trigeminy (  1 premature ventricular beat every 3 beats).   21 short Atrial Runs: Fastest were: 5 beats lasting 2 seconds at a rate of 140 to 171 bpm and average 154 bpm., and 10 beats lasting 3.8 seconds with a rate range of 125 to 169 bpm and average 155 bpm.  The longest episode was 25 beats lasting 15 seconds with a rate range of 82 to 125 bpm and an average of 104 bpm.   No Sustained Arrhythmias: Atrial Tachycardia (AT), Supraventricular Tachycardia (SVT), Atrial  Fibrillation (A-Fib), Atrial Flutter (A-Flutter), Sustained Ventricular Tachycardia (VT)  Lab Results  Component Value Date   CHOL 142 09/23/2023   HDL 59 09/23/2023   LDLCALC 69 09/23/2023   TRIG 73 09/23/2023   CHOLHDL 2.4 09/23/2023   Lab Results  Component Value Date   NA 140 07/11/2022   K 4.3 07/11/2022   CREATININE 0.93 07/11/2022   EGFR 81.0 07/30/2022   GLUCOSE 99 07/11/2022   Lab Results  Component Value Date   ALT 25 07/11/2022   AST 22 07/11/2022   BILITOT 0.7 07/11/2022   No results found for: "HGBA1C"  Risk Assessment/Calculations:          Physical Exam:   VS:  BP 100/60 (BP Location: Right Arm, Patient Position: Sitting, Cuff Size: Normal)   Pulse (!) 53   Ht 5\' 11"  (1.803 m)   Wt 207 lb (93.9 kg)   SpO2 94%   BMI 28.87 kg/m    Wt Readings from Last 3 Encounters:  09/27/23 207 lb (93.9 kg)  07/10/23 210 lb 12.2 oz (95.6 kg)  05/15/23 210 lb 6.4 oz (95.4 kg)    GEN: Well nourished, well groomed in no acute distress; robust.  Healthy-appearing. NECK: No JVD; No carotid bruits CARDIAC: Normal S1, S2; RRR, no murmurs, rubs, gallops RESPIRATORY:  Clear to auscultation without rales, wheezing or rhonchi ; nonlabored, good air movement. ABDOMEN: Soft, non-tender, non-distended EXTREMITIES:  No edema; No deformity      ASSESSMENT AND PLAN: .    Problem List Items Addressed This Visit       Cardiology Problems   Elevated coronary artery calcium score - Primary (Chronic)   Elevated coronary calcium score of 189, but within acceptable range for a 68 year old male. Non-smoker with controlled blood sugar and significantly improved cholesterol levels on statin therapy (Crestor 20mg  daily). No reported side effects from statin therapy.   -Continue Crestor 20mg  daily -Continue regular exercise and healthy diet.    Discussed benefits of aspirin for prophylaxis given coronary calcium score and use of Cialis. No reported side effects from aspirin.    -Continue aspirin for prophylaxis.   -If side effects such as nosebleeds or bruising occur, reconsider aspirin use.        Hyperlipidemia LDL goal <70 (Chronic)   Dramatic improvement in lipid panel after starting statin.  LDL 69.  Very happy the results -> well within target range. He seems be tolerating current dose of Crestor to milligram without any major issues. -Continue Crestor 20-minute daily.   -We did discuss using co-Q10 if he were to start having some symptoms.  (Titrated up to 300 mg daily)        Other   Palpitations (Chronic)   Rare PACs and PVCs along with 21 short atrial runs noted on monitoring, longest run lasting 15 seconds at a rate of 171 beats per minute. . No reported symptoms during these episodes.  Symptoms are associated with PACs/PVCs. Discussed maneuvers to potentially  terminate episodes if he becomes symptomatic.   -Continue monitoring for symptoms.   -If symptomatic, attempt vagal maneuvers (coughing or bearing down) to terminate prolonged episodes.          Follow-up   Significant improvement in cholesterol levels, no need for stress testing or procedural precautions at this time.   -Schedule follow-up appointment in 1 year.   -Refill Crestor prescription.       Follow-Up: Return in about 1 year (around 09/26/2024).     Signed, Marykay Lex, MD, MS Douglas Hendricks, M.D., M.S. Interventional Cardiologist  [facility]  Pager # [phone] Phone # [phone] [address] [place], [place] [address]

## 2023-09-27 NOTE — Patient Instructions (Addendum)
Medication Instructions:   NO CHANGES *If you need a refill on your cardiac medications before your next appointment, please call your pharmacy*   Lab Work: NOT NEEDED   Testing/Procedures: NOT NEEDED   Follow-Up: At [facility], you and your health needs are our priority.  As part of our continuing mission to provide you with exceptional heart care, we have created designated Provider Care Teams.  These Care Teams include your primary Cardiologist (physician) and Advanced Practice Providers (APPs -  Physician Assistants and Nurse Practitioners) who all work together to provide you with the care you need, when you need it.     Your next appointment:   12 month(s)  The format for your next appointment:   In Person  Provider:   David Harding, MD    

## 2023-09-30 ENCOUNTER — Encounter: Payer: Self-pay | Admitting: Cardiology

## 2023-09-30 DIAGNOSIS — R002 Palpitations: Secondary | ICD-10-CM | POA: Insufficient documentation

## 2023-09-30 NOTE — Assessment & Plan Note (Signed)
Rare PACs and PVCs along with 21 short atrial runs noted on monitoring, longest run lasting 15 seconds at a rate of 171 beats per minute. . No reported symptoms during these episodes.  Symptoms are associated with PACs/PVCs. Discussed maneuvers to potentially terminate episodes if he becomes symptomatic.   -Continue monitoring for symptoms.   -If symptomatic, attempt vagal maneuvers (coughing or bearing down) to terminate prolonged episodes.

## 2023-09-30 NOTE — Assessment & Plan Note (Addendum)
Elevated coronary calcium score of 189, but within acceptable range for a 68 year old male. Non-smoker with controlled blood sugar and significantly improved cholesterol levels on statin therapy (Crestor 20mg  daily). No reported side effects from statin therapy.   -Continue Crestor 20mg  daily -Continue regular exercise and healthy diet.    Discussed benefits of aspirin for prophylaxis given coronary calcium score and use of Cialis. No reported side effects from aspirin.   -Continue aspirin for prophylaxis.   -If side effects such as nosebleeds or bruising occur, reconsider aspirin use.

## 2023-09-30 NOTE — Assessment & Plan Note (Signed)
Dramatic improvement in lipid panel after starting statin.  LDL 69.  Very happy the results -> well within target range. He seems be tolerating current dose of Crestor to milligram without any major issues. -Continue Crestor 20-minute daily.   -We did discuss using co-Q10 if he were to start having some symptoms.  (Titrated up to 300 mg daily)

## 2023-10-02 ENCOUNTER — Ambulatory Visit (INDEPENDENT_AMBULATORY_CARE_PROVIDER_SITE_OTHER): Payer: Medicare Other | Admitting: Physical Therapy

## 2023-10-02 ENCOUNTER — Encounter: Payer: Self-pay | Admitting: Physical Therapy

## 2023-10-02 DIAGNOSIS — M25612 Stiffness of left shoulder, not elsewhere classified: Secondary | ICD-10-CM

## 2023-10-02 DIAGNOSIS — M6281 Muscle weakness (generalized): Secondary | ICD-10-CM | POA: Diagnosis not present

## 2023-10-02 DIAGNOSIS — M25512 Pain in left shoulder: Secondary | ICD-10-CM

## 2023-10-02 NOTE — Therapy (Addendum)
 OUTPATIENT PHYSICAL THERAPY TREATMENT Discharge   Patient Name: Douglas Hendricks MRN: [id number] DOB:03/02/1956, 68 y.o., male Today's Date: 10/02/2023  END OF SESSION:  PT End of Session - 10/02/23 1059     Visit Number 9    Number of Visits 15    Date for PT Re-Evaluation 11/08/23    Authorization Type [employer] Medicare    Authorization Time Period 07/17/2023-09/25/2023    Authorization - Visit Number 9    Authorization - Number of Visits 15    Progress Note Due on Visit 17    PT Start Time 1059    PT Stop Time 1142    PT Time Calculation (min) 43 min    Activity Tolerance Patient tolerated treatment well    Behavior During Therapy [facility] for tasks assessed/performed                    Past Medical History:  Diagnosis Date   Arthritis    S/p right hip replacement with redo surgery in 2014   BPH (benign prostatic hyperplasia)    Complication of anesthesia    severe chest pain 2008   Hernia, inguinal, left 2017   Hyperlipidemia    Intolerant to statins-myalgias (atorvastatin 20 mg)   OSA (obstructive sleep apnea)    Thoracic stomach hernia 2015   Past Surgical History:  Procedure Laterality Date   ABDOMINAL HERNIA REPAIR  2015   Umbilical   Cataract surgery Bilateral    COLONOSCOPY  10/2019   Repeated every 3 years   hip pain Bilateral    INGUINAL HERNIA REPAIR Left    KNEE SURGERY Left 1997   SHOULDER ARTHROSCOPY WITH ROTATOR CUFF REPAIR AND SUBACROMIAL DECOMPRESSION Left 07/10/2023   Procedure: LEFT SHOULDER ARTHROSCOPY, EXTENSIVE DEBRIDEMENT, SUBACROMIAL DECOMPRESSION, ROTATOR CUFF REPAIR;  Surgeon: Jerri Kay HERO, MD;  Location: [facility];  Service: Orthopedics;  Laterality: Left;   SHOULDER SURGERY     TOTAL HIP ARTHROPLASTY Right 06/29/2013   [facility]-Dr. Sande (Redo THR)   TYMPANOSTOMY Right 04/2020   Patient Active Problem List   Diagnosis Date Noted   Palpitations 09/30/2023   Traumatic tear of supraspinatus tendon, left, initial  encounter 07/10/2023   Degenerative tear of glenoid labrum, left 07/10/2023   Adjustment disorder with mixed emotional features 06/18/2023   Elevated coronary artery calcium  score 05/15/2023   Hyperlipidemia LDL goal <70 05/15/2023   Pain in right hip 06/19/2022   Partial tear of left rotator cuff 02/16/2020   Impingement syndrome of left shoulder 02/16/2020   Pain in left shoulder 08/19/2019   Acromioclavicular sprain, left, initial encounter 07/09/2019   Chronic right shoulder pain 08/25/2018   Ventral hernia without obstruction or gangrene 03/18/2014   PCP: Okey Carlin Redbird, MD  REFERRING PROVIDER: Jerri Kay HERO, MD  REFERRING DIAG:  [phone] (ICD-10-CM) - Degenerative tear of glenoid labrum, left  D53.187J (ICD-10-CM) - Traumatic tear of supraspinatus tendon, left, initial encounter   THERAPY DIAG:  No diagnosis found.  Rationale for Evaluation and Treatment: Rehabilitation  ONSET DATE: 07/10/2023 RTC repair surgery  SUBJECTIVE:  SUBJECTIVE STATEMENT: Pt reporting pain 0.5/10 with no significant pain over the last week.  Hand dominance: Right  PERTINENT HISTORY: left RTC repair 07/10/23, bilateral THA 2015 & 2014, right RTC repair ~2018, abdominal hernia repair,   PAIN:  NPRS scale:  0.5/10, pt stating no significant pain in the past weeks.  Pain location: left shoulder more anterior & posterior over muscle areas Pain description: ache Aggravating factors: minor movement like putting shirt on Relieving factors: wearing sling & minimize movement  PRECAUTIONS: Shoulder  RED FLAGS: None   WEIGHT BEARING RESTRICTIONS: No  FALLS:  Has patient fallen in last 6 months? No  LIVING ENVIRONMENT: Lives with: lives alone Lives in: Mobile home loft extra sleeping Stairs: Yes: External: 0  steps; none  OCCUPATION: Retired  PLOF: Independent  PATIENT GOALS: to use arm to swim, exercise and function with ADLs  NEXT MD VISIT: 07/18/2023  OBJECTIVE:  Note: Objective measures were completed at Evaluation unless otherwise noted.  PATIENT SURVEYS :  07/17/2023 FOTO 52% with target 70% 09/10/23:  FOTO 64%  COGNITION: 07/17/2023 Overall cognitive status: Within functional limits for tasks assessed     SENSATION: 07/17/2023 [facility]  POSTURE: 07/17/2023 Head forward and rounded shoulder  UPPER EXTREMITY ROM:    ROM Left 07/17/2023 Passive Left 07/30/23 Passive Left 08/05/23 Passive Left 08/29/2023 AROM In supine Left 09/10/23 Left 10/02/23  Shoulder flexion 69* 149* 151* 175 175 176  Shoulder extension        Shoulder abduction 90* 119* 168* 170 172 178  Shoulder adduction        Shoulder internal rotation 30* 55* 66* 80 in 45 deg abduction    Shoulder external rotation 30* 48* 51* 85 in 45 deg abduction 85 deg shoulder abd 45 deg 85 deg shoulder abd 45 deg  Elbow flexion        Elbow extension        Wrist flexion        Wrist extension        Wrist ulnar deviation        Wrist radial deviation        Wrist pronation        Wrist supination        (Blank rows = not tested)  UPPER EXTREMITY MMT: 07/17/2023 Left shoulder MMT Not Tested at eval due to time since sg  MMT 08/29/2023 Right 08/29/2023 Left 09/10/23 Left 10/02/23  Shoulder flexion 5/5 4/5 4/5 4/5  Shoulder extension    5/5  Shoulder abduction 5/5 3+/5 3+/5 4-/5  Shoulder adduction      Shoulder internal rotation 5/5 5/5 5/5 5  Shoulder external rotation 5/5 4/5 4+/5 4+  Middle trapezius      Lower trapezius      Elbow flexion 5/5     Elbow extension 5/5     Wrist flexion      Wrist extension      Wrist ulnar deviation      Wrist radial deviation      Wrist pronation      Wrist supination      Grip strength (lbs)      (Blank rows = not tested)  PALPATION:  07/17/2023 Tenderness over  pectoralis, upper trapezius, middle traps, levator scapulae and biceps                  TODAY'S TREATMENT:  DATE: 10/02/2023:  TherEx:  Pulleys abduction x 2 minutes, flexion x 1 minute BATCA rows: 65# 2 x 20 BATCA 10# diagonals D1 and D2 x 20  BATCA lat pull downs 65# 2 x 20 Prone: I's T's, W's, all x 15 Plank holding 30 sec Pt's HEP was reviwed and each exercise demonstrated      TODAY'S TREATMENT:                                                                                                       DATE: 09/24/2023:  TherEx:  Pulleys flexion / extension 4 min and scaption to internal rotation behind hip 5 min.  PT added to HEP with HO & pt verbalized understanding.  Sidelying shoulder abduction and external rotation 15 reps 2 sets.PT added to HEP with HO & pt verbalized understanding.  Standing shoulder flexion & scaption 3# tapping to 1st & 3rd (overhead) shelf 10 reps PT added to HEP with HO & pt verbalized understanding.  Standing internal rotation / reaching hand behind back - initially with towel then AROM Push up from counter 15 reps PT reviewed resistive exercises & reactive isometrics with Tband from HEP with reducing to 3x/wk.  Pt verbalized understanding.      TREATMENT:                                                                                                       DATE: 09/10/2023:  TherEx:  UBE: level 3 x 4 minutes each direction Standing flexion 3 # x 10 bil UE Standing lifting 3# onto 3nd gym shelf x 10 and 3rd shelf x 10  BATCA rows: 55#  x 15, 65# x 15 holding 3 sec each set BATCA lat pull downs: 45# 2 x 15  BATCA diagonals 5 # : wood chop bil UE's 2 x 10 each direction Side-lying abd no weight: pain free upper arc of motion 2 x 10   Updated MMT and ROM measurements see chart above  Manual:  IASTM to left deltoid, following manual therapy pt able to abduction  his left shoulder with no pain.     TREATMENT:  DATE: 09/03/2023:  Therex: Tband rows blue 3 x 15 bilateral Tband gh ext blue  3 x 15 bilateral Lt shoulder ER walkout isometric green band 10 sec hold x 6 with towel under arm Green band Lt arm ER to neutral with flexion punch  x 10  UBE fwd/back lvl 3.0 UE only with interval faster 10 seconds top of each minute, 4 mins fwd/back each  Supine horizontal abduction blue  band 2 x 15 Supine D2/D1 alternating bilateral blue band 2 x 15 each way  Machine rows 2 x 15 45 lbs  Lat pull down 35 lbs performed to fatigue    TREATMENT:                                                                                                       DATE: 08/29/2023:  Therex: UBE fwd/back 3 mins each way lvl 3.0  Tband rows green 2 x 15 bilateral Tband gh ext green 2 x 15 bilateral Lt shoulder ER walkout isometric green band 5 sec hold x8, with blue band x 2 with towel under arm Lt shoulder IR blue band 2 x 15 c towel under arm Standing 90 deg flexion 2 lb ball small circles in 90 deg flexion 2 x 30 cw, ccw each Wall push up with SA press hold 3 seconds bilateral arm x 15    PATIENT EDUCATION: 08/29/2023 Update:  Education details: HEP Person educated: Patient Education method: Programmer, Multimedia, Facilities Manager, Verbal cues, and Handouts Education comprehension: verbalized understanding, returned demonstration, and verbal cues required  HOME EXERCISE PROGRAM: Access Code: [email or web address] URL: [email or web address] Date: 10/02/2023 Prepared by: Delon Lunger  Exercises - Standing Bilateral Low Shoulder Row with Anchored Resistance  - 1-2 x daily - 3 x weekly - 2-3 sets - 10-15 reps - Shoulder Extension with Resistance  - 1-2 x daily - 3 x weekly - 2-3 sets - 10-15 reps - Shoulder External Rotation with Anchored Resistance  - 1 x daily - 3 x weekly - 3 sets  - 10-15 reps - Shoulder Internal Rotation with Resistance (Mirrored)  - 1-2 x daily - 3 x weekly - 3 sets - 10 reps - Shoulder External Rotation Reactive Isometrics  - 1 x daily - 3 x weekly - 1 sets - 10 reps - 5-15 hold - Sidelying Shoulder Abduction Palm Forward  - 1 x daily - 5-7 x weekly - 2 sets - 15 reps - 2 seconds hold - Sidelying Shoulder External Rotation  - 1 x daily - 5-7 x weekly - 2 sets - 15 reps - 2 seconds hold - Single Arm Shoulder Flexion with Dumbbell  - 1 x daily - 5-7 x weekly - 1-2 sets - 15 reps - Standing Single Arm Shoulder Scaption with Dumbbell  - 1 x daily - 5-7 x weekly - 1-2 sets - 15 reps - Standing Single Arm Shoulder PNF D1 Extension with Anchored Resistance  - 1 x daily - 7 x weekly - 3 sets - 10 reps - Prone W Scapular Retraction  - 1 x daily - 7 x  weekly - 2 sets - 15 reps - Standard Plank  - 1 x daily - 7 x weekly - 5 reps - 30 seconds hold - Standing Shoulder Diagonal Horizontal Abduction 60/120 Degrees with Resistance  - 1 x daily - 7 x weekly - 3 sets - 10 reps - Standing Shoulder Horizontal Abduction with Resistance  - 1 x daily - 7 x weekly - 3 sets - 10 reps  ASSESSMENT:  CLINICAL IMPRESSION: Pt is leaving for 3 week trip [place]. Pt's HEP was updated this visit. Pt has met all of his STG's and 4 out of 7 of his LTGs.Pt's FOTO increased to 68%.  Pt still progressing with overall strength. Pt is being discharged today from skilled PT services.      OBJECTIVE IMPAIRMENTS: decreased activity tolerance, decreased knowledge of condition, decreased ROM, decreased strength, increased edema, impaired flexibility, impaired UE functional use, and pain.   ACTIVITY LIMITATIONS: carrying, lifting, bed mobility, reach over head, and exercise  PARTICIPATION LIMITATIONS: cleaning and laundry  PERSONAL FACTORS: Past/current experiences and 1-2 comorbidities: see PMH  are also affecting patient's functional outcome.   REHAB POTENTIAL: Good  CLINICAL DECISION  MAKING: Stable/uncomplicated  EVALUATION COMPLEXITY: Low   GOALS: Goals reviewed with patient? Yes  SHORT TERM GOALS: (target date for Short term goals are 3 weeks 08/07/2023)  1.Patient will demonstrate independent use of home exercise program to maintain progress from in clinic treatments. Goal status: MET 07/30/2023  LONG TERM GOALS: (target dates for all long term goals are 8 weeks  11/08/2023)   1. Patient will demonstrate/report pain at worst less than or equal to 2/10 to facilitate minimal limitation in daily activity secondary to pain symptoms. Goal status:MET 10/02/23   2. Patient will demonstrate independent use of home exercise program to facilitate ability to maintain/progress functional gains from skilled physical therapy services. Goal status: MET 10/02/23   3. Patient will demonstrate FOTO outcome > or = 70 % to indicate reduced disability due to condition. Goal status: Ongoing   10/02/2023   4.  Patient will demonstrate left shoulder UE MMT 5/5 throughout to facilitate lifting, reaching, carrying at [facility] in daily activity.   Goal status: Ongoing  10/02/2023   5.  Patient will demonstrate left GH joint AROM WFL s symptoms to facilitate usual overhead reaching, self care, dressing at PLOF.    Goal status: Met 08/29/2023   6.  Patient able to lift 2# to top shelf of upper cabinet.  Goal status: Met 09/10/23   7.  patient reports able to use LUE with swimming and other exercise equipment per his goal. Goal Status: Ongoing  10/02/23   PLAN: PT FREQUENCY: 1x/ week   PT DURATION: 8 weeks  PLANNED INTERVENTIONS: 97110-Therapeutic exercises, 97530- Therapeutic activity, 97112- Neuromuscular re-education, 97535- Self Care, [id number]- Manual therapy, 97016- Vasopneumatic device, Patient/Family education, Taping, Joint mobilization, Scar mobilization, Cryotherapy, and Moist heat  PLAN FOR NEXT SESSION:  Discharge   Delon JONELLE Lunger, PT, MPT 10/02/2023, 11:53 AM PHYSICAL THERAPY  DISCHARGE SUMMARY  Visits from Start of Care: 9  Current functional level related to goals / functional outcomes: See above   Remaining deficits: See above   Education / Equipment: HEP   Patient agrees to discharge. Patient goals were partially met. Patient is being discharged due to being pleased with the current functional level.

## 2023-10-09 ENCOUNTER — Encounter: Payer: Medicare Other | Admitting: Physical Therapy

## 2023-10-16 ENCOUNTER — Encounter: Payer: Medicare Other | Admitting: Physical Therapy

## 2023-10-28 ENCOUNTER — Ambulatory Visit: Payer: Medicare Other | Admitting: Licensed Clinical Social Worker

## 2023-10-28 ENCOUNTER — Encounter: Payer: Medicare Other | Admitting: Physical Therapy

## 2023-10-30 ENCOUNTER — Encounter: Payer: Medicare Other | Admitting: Physical Therapy

## 2023-10-31 ENCOUNTER — Ambulatory Visit: Payer: Medicare Other | Admitting: Licensed Clinical Social Worker

## 2023-10-31 DIAGNOSIS — F4329 Adjustment disorder with other symptoms: Secondary | ICD-10-CM

## 2023-10-31 NOTE — Progress Notes (Signed)
 [facility] Counselor/Therapist Progress Note  Patient ID: Cortney Mckinney, MRN: [id number]    Date: 10/31/23  Time Spent: 1000  am - 1050 am : 50 Minutes  Treatment Type: Individual Therapy.  Reported Symptoms: Symptoms of depression and anxiety related to uncertainty of future life decisions.  Mental Status Exam: Appearance:  Well Groomed     Behavior: Appropriate  Motor: Normal  Speech/Language:  Clear and Coherent  Affect: Appropriate  Mood: normal  Thought process: normal  Thought content:   WNL  Sensory/Perceptual disturbances:   WNL  Orientation: oriented to person, place, time/date, situation, day of week, month of year, and year  Attention: Good  Concentration: Good  Memory: WNL  Fund of knowledge:  Good  Insight:   Good  Judgment:  Good  Impulse Control: Good   Risk Assessment: Danger to Self:  No Self-injurious Behavior: No Danger to Others: No Duty to Warn:no Physical Aggression / Violence:No  Access to Firearms a concern: No  Gang Involvement:No   Subjective:   Dwyane Luo participated from office, located at [facility] with Clinician present. Zyen consented to treatment.   Cedar presented for his session reporting having a good time in [place]. [place]. He states it was a nice tri, bit not a place he would want  live.  Patient reports several more foreign trips that he has planned and will be leaving in the next couple of weeks for [place]. Patient states he remains friends with [name] but they both know they cannot have a relationship under these circumstances. He states he was seeing another lady but cut that off too. He states he want to be certain of where he will be living before getting involved in a relationship.  Clinician actively listened and provided validation and support. Clinician praised patient for his insight as well as his ability to maintain a relationship with [name]. Clinician pointed out that this showed a strong  foundation in the relationship. Clinician processed with patient that the physical distance geographically will make it easier to maintain just a friendship. Clinician pointed out that traveling and meeting new people will be a great way for patient to find himself and what he truly wants in his life.  Patient agreed that he does feel that he is in a better place and feels more optimistic about his future. Patient admits that he doesn't want to be alone but he will take it slow and wait until he is settled and knows where he will be living before making any commitments. Patient agreed to continue to work on his happiness and utilize coping skills to help him on a daily basis.  Patient is encouraged to work on the following goals in order to improve his future relationships.   Communication: Learning active listening skills  Expressing needs clearly and respectfully  Avoiding negative communication patterns like criticism and defensiveness  Emotional Intimacy: Sharing feelings openly and honestly  Building trust and vulnerability  Understanding and validating each other's emotions  Conflict Resolution: Identifying triggers and patterns of conflict  Learning to compromise and find solutions together  Managing anger and frustration effectively. Patient will be traveling but will continue with therapy as needed over the next couple of months. Treatment plan to be reviewed by 06/17/2024.      Interventions: Cognitive Behavioral Therapy and Solution-Oriented/Positive Psychology  Diagnosis: Adjustment Disorder with mixed emotional features   Phyllis Ginger MSW, LCSW/DATE 10/31/2023

## 2023-11-06 ENCOUNTER — Encounter: Payer: Medicare Other | Admitting: Physical Therapy

## 2023-11-07 ENCOUNTER — Other Ambulatory Visit: Payer: Self-pay | Admitting: Cardiology

## 2023-11-13 ENCOUNTER — Encounter: Payer: Medicare Other | Admitting: Physical Therapy

## 2024-02-05 DIAGNOSIS — Z131 Encounter for screening for diabetes mellitus: Secondary | ICD-10-CM | POA: Diagnosis not present

## 2024-02-05 DIAGNOSIS — E7800 Pure hypercholesterolemia, unspecified: Secondary | ICD-10-CM | POA: Diagnosis not present

## 2024-02-06 DIAGNOSIS — Z0000 Encounter for general adult medical examination without abnormal findings: Secondary | ICD-10-CM | POA: Diagnosis not present

## 2024-02-24 DIAGNOSIS — Z961 Presence of intraocular lens: Secondary | ICD-10-CM | POA: Diagnosis not present

## 2024-02-24 DIAGNOSIS — H524 Presbyopia: Secondary | ICD-10-CM | POA: Diagnosis not present

## 2024-02-28 DIAGNOSIS — M25512 Pain in left shoulder: Secondary | ICD-10-CM | POA: Diagnosis not present

## 2024-03-26 DIAGNOSIS — D124 Benign neoplasm of descending colon: Secondary | ICD-10-CM | POA: Diagnosis not present

## 2024-03-26 DIAGNOSIS — Z09 Encounter for follow-up examination after completed treatment for conditions other than malignant neoplasm: Secondary | ICD-10-CM | POA: Diagnosis not present

## 2024-03-26 DIAGNOSIS — Z860101 Personal history of adenomatous and serrated colon polyps: Secondary | ICD-10-CM | POA: Diagnosis not present

## 2024-03-26 DIAGNOSIS — K5730 Diverticulosis of large intestine without perforation or abscess without bleeding: Secondary | ICD-10-CM | POA: Diagnosis not present

## 2024-03-26 DIAGNOSIS — D123 Benign neoplasm of transverse colon: Secondary | ICD-10-CM | POA: Diagnosis not present

## 2024-04-14 DIAGNOSIS — M216X2 Other acquired deformities of left foot: Secondary | ICD-10-CM | POA: Diagnosis not present

## 2024-04-14 DIAGNOSIS — M21622 Bunionette of left foot: Secondary | ICD-10-CM | POA: Diagnosis not present

## 2024-05-28 DIAGNOSIS — M792 Neuralgia and neuritis, unspecified: Secondary | ICD-10-CM | POA: Diagnosis not present

## 2024-05-28 DIAGNOSIS — M216X2 Other acquired deformities of left foot: Secondary | ICD-10-CM | POA: Diagnosis not present

## 2024-06-24 ENCOUNTER — Ambulatory Visit: Admitting: Orthopaedic Surgery

## 2024-06-24 ENCOUNTER — Other Ambulatory Visit (INDEPENDENT_AMBULATORY_CARE_PROVIDER_SITE_OTHER)

## 2024-06-24 DIAGNOSIS — M25512 Pain in left shoulder: Secondary | ICD-10-CM | POA: Diagnosis not present

## 2024-06-24 DIAGNOSIS — G8929 Other chronic pain: Secondary | ICD-10-CM

## 2024-06-24 NOTE — Progress Notes (Signed)
 Office Visit Note   Patient: Douglas Hendricks           Date of Birth: 01-26-1956           MRN: [id number] Visit Date: 06/24/2024              Requested by: Okey Carlin Redbird, MD [address] [place],  [place] [address] PCP: Okey Carlin Redbird, MD   Assessment & Plan: Visit Diagnoses:  1. Chronic left shoulder pain     Plan: History of Present Illness Jerrik Housholder is a 68 year old male who presents with shoulder pain exacerbated by swimming and weightlifting.  Pain is located behind the shoulder blade, sometimes radiating to the lateral deltoid area, rated as a 'two' on a pain scale. Swimming, especially when followed by weightlifting, triggers the pain. Rest significantly reduces symptoms, with improvement noted after a three to four-week break from swimming.  His weightlifting routine includes high-repetition sets of chest press, lateral pull down, shoulder press, and another pulling exercise, with two sets of thirty repetitions each. Weightlifting alone does not typically exacerbate symptoms unless preceded by swimming.  He is active, enjoying swimming, weightlifting, biking, and traveling, including long bicycle trips in [place]. No significant changes in physical activity levels aside from the recent break from swimming. Occasional tenderness along the medial border of the scapula and lateral deltoid area is noted, described as muscle overexertion.  He is status post left rotator cuff repair in August 2024.  Physical Exam MUSCULOSKELETAL: Shoulder flexibility normal. Tenderness along medial border of scapula.  Manual muscle testing of the rotator cuff shows excellent strength without pain.  No scapular winging  Assessment and Plan Chronic left scapular rhomboid muscle pain Pain localized along the medial scapular border, rated 2/10, exacerbated by swimming and weight training. No rotator cuff injury or structural damage on x-ray. Symptoms consistent with muscle  overexertion. - Use a golf ball in a sock to massage the area against a wall. - Audiological scientist for relief. - Apply heat before exercise. - Use topical analgesics like Aspercreme or Bengay. - Perform stretching exercises by reaching across the body. - Consider massage therapy or cupping if symptoms persist. - Avoid activities that exacerbate pain.  Follow-Up Instructions: No follow-ups on file.   Orders:  Orders Placed This Encounter  Procedures   XR Shoulder Left   No orders of the defined types were placed in this encounter.     Procedures: No procedures performed   Clinical Data: No additional findings.   Subjective: Chief Complaint  Patient presents with   Left Shoulder - Pain    HPI  Review of Systems  Constitutional: Negative.   HENT: Negative.    Eyes: Negative.   Respiratory: Negative.    Cardiovascular: Negative.   Gastrointestinal: Negative.   Endocrine: Negative.   Genitourinary: Negative.   Skin: Negative.   Allergic/Immunologic: Negative.   Neurological: Negative.   Hematological: Negative.   Psychiatric/Behavioral: Negative.    All other systems reviewed and are negative.    Objective: Vital Signs: There were no vitals taken for this visit.  Physical Exam Vitals and nursing note reviewed.  Constitutional:      Appearance: He is well-developed.  HENT:     Head: Normocephalic and atraumatic.  Eyes:     Pupils: Pupils are equal, round, and reactive to light.  Pulmonary:     Effort: Pulmonary effort is normal.  Abdominal:     Palpations: Abdomen is soft.  Musculoskeletal:  General: Normal range of motion.     Cervical back: Neck supple.  Skin:    General: Skin is warm.  Neurological:     Mental Status: He is alert and oriented to person, place, and time.  Psychiatric:        Behavior: Behavior normal.        Thought Content: Thought content normal.        Judgment: Judgment normal.      Imaging: XR  Shoulder Left Result Date: 06/24/2024 X-rays of the left shoulder show no acute or structural abnormalities     PMFS History: Patient Active Problem List   Diagnosis Date Noted   Palpitations 09/30/2023   Traumatic tear of supraspinatus tendon, left, initial encounter 07/10/2023   Degenerative tear of glenoid labrum, left 07/10/2023   Adjustment disorder with mixed emotional features 06/18/2023   Elevated coronary artery calcium  score 05/15/2023   Hyperlipidemia LDL goal <70 05/15/2023   Pain in right hip 06/19/2022   Partial tear of left rotator cuff 02/16/2020   Impingement syndrome of left shoulder 02/16/2020   Pain in left shoulder 08/19/2019   Acromioclavicular sprain, left, initial encounter 07/09/2019   Chronic right shoulder pain 08/25/2018   Ventral hernia without obstruction or gangrene 03/18/2014   Past Medical History:  Diagnosis Date   Arthritis    S/p right hip replacement with redo surgery in 2014   BPH (benign prostatic hyperplasia)    Complication of anesthesia    severe chest pain 2008   Hernia, inguinal, left 2017   Hyperlipidemia    Intolerant to statins-myalgias (atorvastatin 20 mg)   OSA (obstructive sleep apnea)    Thoracic stomach hernia 2015    Family History  Problem Relation Age of Onset   Cancer Mother        pancreatic   COPD Father    Cancer Father        esophageal   Ovarian cancer Sister        In remission x 15 years   Throat cancer Sister    Healthy Sister    Valvular heart disease Brother        Heart valve surgery   Hyperlipidemia Brother    Other Brother        Low back pain from spinal:/Spinal stenosis   Glaucoma Brother     Past Surgical History:  Procedure Laterality Date   ABDOMINAL HERNIA REPAIR  2015   Umbilical   Cataract surgery Bilateral    COLONOSCOPY  10/2019   Repeated every 3 years   hip pain Bilateral    INGUINAL HERNIA REPAIR Left    KNEE SURGERY Left 1997   SHOULDER ARTHROSCOPY WITH ROTATOR CUFF  REPAIR AND SUBACROMIAL DECOMPRESSION Left 07/10/2023   Procedure: LEFT SHOULDER ARTHROSCOPY, EXTENSIVE DEBRIDEMENT, SUBACROMIAL DECOMPRESSION, ROTATOR CUFF REPAIR;  Surgeon: Jerri Kay HERO, MD;  Location: [facility];  Service: Orthopedics;  Laterality: Left;   SHOULDER SURGERY     TOTAL HIP ARTHROPLASTY Right 06/29/2013   [facility]-Dr. Sande (Redo THR)   TYMPANOSTOMY Right 04/2020   Social History   Occupational History   Not on file  Tobacco Use   Smoking status: Never   Smokeless tobacco: Never   Tobacco comments:    Drinks 2 12 ounce servings of coffee a day.  Vaping Use   Vaping status: Never Used  Substance and Sexual Activity   Alcohol use: Yes    Alcohol/week: 4.0 standard drinks of alcohol    Types: 4  Standard drinks or equivalent per week    Comment: 1-2   Drug use: No   Sexual activity: Not on file

## 2024-06-29 ENCOUNTER — Encounter: Payer: Self-pay | Admitting: Radiology

## 2024-08-14 ENCOUNTER — Encounter: Payer: Self-pay | Admitting: Cardiology

## 2024-09-28 ENCOUNTER — Ambulatory Visit: Admitting: Podiatry

## 2024-10-21 ENCOUNTER — Ambulatory Visit: Admitting: Podiatry
